# Patient Record
Sex: Female | Born: 1935 | Race: White | Hispanic: No | Smoking: Never smoker
Health system: Southern US, Community
[De-identification: ages and names within clinical notes are randomized; demographics above are authoritative.]

## PROBLEM LIST (undated history)

## (undated) DIAGNOSIS — M1990 Unspecified osteoarthritis, unspecified site: Secondary | ICD-10-CM

## (undated) DIAGNOSIS — E785 Hyperlipidemia, unspecified: Secondary | ICD-10-CM

## (undated) DIAGNOSIS — K5790 Diverticulosis of intestine, part unspecified, without perforation or abscess without bleeding: Secondary | ICD-10-CM

## (undated) DIAGNOSIS — I10 Essential (primary) hypertension: Secondary | ICD-10-CM

## (undated) DIAGNOSIS — I38 Endocarditis, valve unspecified: Secondary | ICD-10-CM

## (undated) DIAGNOSIS — N183 Chronic kidney disease, stage 3 unspecified: Secondary | ICD-10-CM

## (undated) DIAGNOSIS — I739 Peripheral vascular disease, unspecified: Secondary | ICD-10-CM

## (undated) DIAGNOSIS — D126 Benign neoplasm of colon, unspecified: Secondary | ICD-10-CM

## (undated) DIAGNOSIS — I129 Hypertensive chronic kidney disease with stage 1 through stage 4 chronic kidney disease, or unspecified chronic kidney disease: Secondary | ICD-10-CM

## (undated) DIAGNOSIS — I779 Disorder of arteries and arterioles, unspecified: Secondary | ICD-10-CM

## (undated) DIAGNOSIS — G5620 Lesion of ulnar nerve, unspecified upper limb: Secondary | ICD-10-CM

## (undated) HISTORY — PX: CATARACT EXTRACTION: SUR2

## (undated) HISTORY — PX: ULNAR NERVE TRANSPOSITION: SHX2595

## (undated) HISTORY — PX: TONSILLECTOMY: SUR1361

---

## 1988-07-19 HISTORY — PX: OTHER SURGICAL HISTORY: SHX169

## 2004-08-31 ENCOUNTER — Ambulatory Visit: Payer: Self-pay | Admitting: Family Medicine

## 2005-09-02 ENCOUNTER — Ambulatory Visit: Payer: Self-pay | Admitting: Family Medicine

## 2006-09-06 ENCOUNTER — Ambulatory Visit: Payer: Self-pay | Admitting: Family Medicine

## 2007-05-02 ENCOUNTER — Ambulatory Visit: Payer: Self-pay | Admitting: General Surgery

## 2007-05-17 ENCOUNTER — Other Ambulatory Visit: Payer: Self-pay

## 2007-05-17 ENCOUNTER — Ambulatory Visit: Payer: Self-pay | Admitting: General Surgery

## 2007-05-24 ENCOUNTER — Inpatient Hospital Stay: Payer: Self-pay | Admitting: General Surgery

## 2007-09-13 ENCOUNTER — Emergency Department: Payer: Self-pay | Admitting: Emergency Medicine

## 2007-09-28 ENCOUNTER — Emergency Department: Payer: Self-pay | Admitting: Emergency Medicine

## 2007-11-08 ENCOUNTER — Ambulatory Visit: Payer: Self-pay | Admitting: Family Medicine

## 2008-03-18 ENCOUNTER — Ambulatory Visit: Payer: Self-pay | Admitting: Orthopedic Surgery

## 2008-03-21 ENCOUNTER — Other Ambulatory Visit: Payer: Self-pay

## 2008-03-21 ENCOUNTER — Ambulatory Visit: Payer: Self-pay | Admitting: Orthopedic Surgery

## 2008-11-11 ENCOUNTER — Ambulatory Visit: Payer: Self-pay | Admitting: Family Medicine

## 2008-12-12 ENCOUNTER — Ambulatory Visit: Payer: Self-pay | Admitting: Gastroenterology

## 2009-12-19 ENCOUNTER — Ambulatory Visit: Payer: Self-pay | Admitting: Internal Medicine

## 2010-12-24 ENCOUNTER — Ambulatory Visit: Payer: Self-pay | Admitting: Internal Medicine

## 2011-12-29 ENCOUNTER — Ambulatory Visit: Payer: Self-pay | Admitting: Internal Medicine

## 2012-07-19 HISTORY — PX: EYE SURGERY: SHX253

## 2012-07-19 HISTORY — PX: SHOULDER ARTHROSCOPY: SHX128

## 2012-12-29 ENCOUNTER — Ambulatory Visit: Payer: Self-pay | Admitting: Internal Medicine

## 2013-12-31 ENCOUNTER — Ambulatory Visit: Payer: Self-pay | Admitting: Internal Medicine

## 2016-06-17 ENCOUNTER — Encounter: Payer: Self-pay | Admitting: *Deleted

## 2016-06-17 ENCOUNTER — Encounter
Admission: RE | Admit: 2016-06-17 | Discharge: 2016-06-17 | Disposition: A | Discharge status: Home / Self Care | Payer: Medicare Other | Source: Ambulatory Visit | Attending: Orthopedic Surgery | Admitting: Orthopedic Surgery

## 2016-06-17 DIAGNOSIS — Z01818 Encounter for other preprocedural examination: Secondary | ICD-10-CM | POA: Diagnosis not present

## 2016-06-17 DIAGNOSIS — M1812 Unilateral primary osteoarthritis of first carpometacarpal joint, left hand: Secondary | ICD-10-CM | POA: Insufficient documentation

## 2016-06-17 LAB — BASIC METABOLIC PANEL
ANION GAP: 6 (ref 5–15)
BUN: 23 mg/dL — ABNORMAL HIGH (ref 6–20)
CO2: 29 mmol/L (ref 22–32)
Calcium: 9.5 mg/dL (ref 8.9–10.3)
Chloride: 104 mmol/L (ref 101–111)
Creatinine, Ser: 1.49 mg/dL — ABNORMAL HIGH (ref 0.44–1.00)
GFR, EST AFRICAN AMERICAN: 37 mL/min — AB (ref >60)
GFR, EST NON AFRICAN AMERICAN: 32 mL/min — AB (ref >60)
Glucose, Bld: 103 mg/dL — ABNORMAL HIGH (ref 65–99)
POTASSIUM: 4.2 mmol/L (ref 3.5–5.1)
SODIUM: 139 mmol/L (ref 135–145)

## 2016-06-17 NOTE — OR Nursing (Signed)
Patty from Dr. Sharyon Cable' office called back to say that an EKG was not done last week, but an ECHO was performed. Asked her to fax that to [facility]  Will call patient to return for an EKG.

## 2016-06-17 NOTE — OR Nursing (Signed)
Patient states she had an ekg at dr Sharyon Cable' office last week.  Will contact them to obtain a copy of this ekg.

## 2016-06-17 NOTE — Patient Instructions (Signed)
  Your procedure is scheduled on: Thursday, June 24, 2016  Report to [facility], Second Floor.  To find out your arrival time please call [phone] between 1PM - 3PM on Wednesday, June 23, 2016  Remember: Instructions that are not followed completely may result in serious medical risk, up to and including death, or upon the discretion of your surgeon and anesthesiologist your surgery may need to be rescheduled.    _X___ 1. Do not eat food or drink liquids after midnight. No gum chewing or hard candies.     __X__ 2. No Alcohol for 24 hours before or after surgery.   ____ 3. Do Not Smoke For 24 Hours Prior to Your Surgery.   ____ 4. Bring all medications with you on the day of surgery if instructed.    ____ 5. Notify your doctor if there is any change in your medical condition     (cold, fever, infections).       Do not wear jewelry, make-up, hairpins, clips or nail polish.  Do not wear lotions, powders, or perfumes. You may wear deodorant.  Do not shave 48 hours prior to surgery. Men may shave face and neck.  Do not bring valuables to the hospital.    [facility] is not responsible for any belongings or valuables.               Contacts, dentures or bridgework may not be worn into surgery.   Leave your suitcase in the car. After surgery it may be brought to your room.   For patients admitted to the hospital, discharge time is determined by your treatment team.   Patients discharged the day of surgery will not be allowed to drive home.   Please read over the following fact sheets that you were given:   Soap Instruction Sheet ____ Take these medicines the morning of surgery with A SIP OF WATER:    1. Amlodipine  2. Lisinopril  3.   4.  5.STOP SULFASALAZINE AS OF TODAY  6.  ____ Fleet Enema (as directed)   _X___ Use CHG Soap as directed  ____ Use inhalers on the day of surgery  ____ Stop metformin 2 days prior to surgery    ____ Take 1/2 of usual insulin  dose the night before surgery and none on the morning of surgery.   __X_ Stop Aspirin as of TODAY  __X__ Stop Anti-inflammatories AS OF TODAY   __X__ Stop supplements until after surgery.  SUCH AS CALCIUM  ____ Bring C-Pap to the hospital.

## 2016-06-21 ENCOUNTER — Encounter
Admission: RE | Admit: 2016-06-21 | Discharge: 2016-06-21 | Disposition: A | Discharge status: Home / Self Care | Payer: Medicare Other | Source: Ambulatory Visit | Attending: Orthopedic Surgery | Admitting: Orthopedic Surgery

## 2016-06-21 DIAGNOSIS — M810 Age-related osteoporosis without current pathological fracture: Secondary | ICD-10-CM | POA: Diagnosis not present

## 2016-06-21 DIAGNOSIS — Z7982 Long term (current) use of aspirin: Secondary | ICD-10-CM | POA: Diagnosis not present

## 2016-06-21 DIAGNOSIS — M1812 Unilateral primary osteoarthritis of first carpometacarpal joint, left hand: Secondary | ICD-10-CM | POA: Diagnosis not present

## 2016-06-21 DIAGNOSIS — M064 Inflammatory polyarthropathy: Secondary | ICD-10-CM | POA: Diagnosis not present

## 2016-06-21 DIAGNOSIS — Z86010 Personal history of colonic polyps: Secondary | ICD-10-CM | POA: Diagnosis not present

## 2016-06-21 DIAGNOSIS — I10 Essential (primary) hypertension: Secondary | ICD-10-CM | POA: Diagnosis not present

## 2016-06-24 ENCOUNTER — Encounter
Admission: RE | Disposition: A | Discharge status: Home / Self Care | Payer: Self-pay | Source: Ambulatory Visit | Attending: Orthopedic Surgery

## 2016-06-24 ENCOUNTER — Ambulatory Visit: Payer: Medicare Other | Admitting: Anesthesiology

## 2016-06-24 ENCOUNTER — Ambulatory Visit
Admission: RE | Admit: 2016-06-24 | Discharge: 2016-06-24 | Disposition: A | Discharge status: Home / Self Care | Payer: Medicare Other | Source: Ambulatory Visit | Attending: Orthopedic Surgery | Admitting: Orthopedic Surgery

## 2016-06-24 DIAGNOSIS — M810 Age-related osteoporosis without current pathological fracture: Secondary | ICD-10-CM | POA: Insufficient documentation

## 2016-06-24 DIAGNOSIS — Z86010 Personal history of colonic polyps: Secondary | ICD-10-CM | POA: Insufficient documentation

## 2016-06-24 DIAGNOSIS — M1812 Unilateral primary osteoarthritis of first carpometacarpal joint, left hand: Secondary | ICD-10-CM | POA: Insufficient documentation

## 2016-06-24 DIAGNOSIS — I10 Essential (primary) hypertension: Secondary | ICD-10-CM | POA: Insufficient documentation

## 2016-06-24 DIAGNOSIS — M064 Inflammatory polyarthropathy: Secondary | ICD-10-CM | POA: Insufficient documentation

## 2016-06-24 DIAGNOSIS — Z7982 Long term (current) use of aspirin: Secondary | ICD-10-CM | POA: Insufficient documentation

## 2016-06-24 HISTORY — PX: CARPOMETACARPAL (CMC) FUSION OF THUMB: SHX6290

## 2016-06-24 HISTORY — DX: Essential (primary) hypertension: I10

## 2016-06-24 SURGERY — CARPOMETACARPAL (CMC) FUSION OF THUMB
Anesthesia: General | Laterality: Left | Wound class: Clean

## 2016-06-24 MED ORDER — METOCLOPRAMIDE HCL 5 MG/ML IJ SOLN: 5.0000 mg | Freq: Three times a day (TID) | INTRAMUSCULAR | Status: DC | PRN

## 2016-06-24 MED ORDER — ONDANSETRON HCL 4 MG/2ML IJ SOLN: 4.0000 mg | Freq: Four times a day (QID) | INTRAMUSCULAR | Status: DC | PRN

## 2016-06-24 MED ORDER — SODIUM CHLORIDE 0.9 % IV SOLN: INTRAVENOUS | Status: DC

## 2016-06-24 MED ORDER — LACTATED RINGERS IV SOLN
INTRAVENOUS | Status: DC
  Administered 2016-06-24: 10:00:00 via INTRAVENOUS

## 2016-06-24 MED ORDER — NEOMYCIN-POLYMYXIN B GU 40-200000 IR SOLN
Status: AC
  Filled 2016-06-24: qty 2

## 2016-06-24 MED ORDER — MIDAZOLAM HCL 2 MG/2ML IJ SOLN
INTRAMUSCULAR | Status: DC | PRN
Start: 2016-06-24 — End: 2016-06-24
  Administered 2016-06-24: 1 mg via INTRAVENOUS

## 2016-06-24 MED ORDER — CEFAZOLIN SODIUM-DEXTROSE 2-4 GM/100ML-% IV SOLN: 2.0000 g | Freq: Once | INTRAVENOUS | Status: DC

## 2016-06-24 MED ORDER — LABETALOL HCL 5 MG/ML IV SOLN
INTRAVENOUS | Status: DC | PRN
  Administered 2016-06-24: 2.5 mg via INTRAVENOUS

## 2016-06-24 MED ORDER — ONDANSETRON HCL 4 MG/2ML IJ SOLN
INTRAMUSCULAR | Status: DC | PRN
  Administered 2016-06-24: 4 mg via INTRAVENOUS

## 2016-06-24 MED ORDER — BUPIVACAINE HCL (PF) 0.5 % IJ SOLN
INTRAMUSCULAR | Status: AC
  Filled 2016-06-24: qty 30

## 2016-06-24 MED ORDER — HYDROCODONE-ACETAMINOPHEN 5-325 MG PO TABS: 1.0000 | ORAL_TABLET | Freq: Four times a day (QID) | ORAL | 0 refills | Status: DC | PRN

## 2016-06-24 MED ORDER — ONDANSETRON HCL 4 MG/2ML IJ SOLN: 4.0000 mg | Freq: Once | INTRAMUSCULAR | Status: DC | PRN

## 2016-06-24 MED ORDER — DEXAMETHASONE SODIUM PHOSPHATE 10 MG/ML IJ SOLN
INTRAMUSCULAR | Status: DC | PRN
  Administered 2016-06-24: 4 mg via INTRAVENOUS

## 2016-06-24 MED ORDER — FENTANYL CITRATE (PF) 100 MCG/2ML IJ SOLN
INTRAMUSCULAR | Status: DC | PRN
  Administered 2016-06-24 (×5): 25 ug via INTRAVENOUS

## 2016-06-24 MED ORDER — ONDANSETRON HCL 4 MG PO TABS: 4.0000 mg | ORAL_TABLET | Freq: Four times a day (QID) | ORAL | Status: DC | PRN

## 2016-06-24 MED ORDER — BUPIVACAINE HCL (PF) 0.5 % IJ SOLN
INTRAMUSCULAR | Status: DC | PRN
  Administered 2016-06-24: 10 mL

## 2016-06-24 MED ORDER — NEOMYCIN-POLYMYXIN B GU 40-200000 IR SOLN
Status: DC | PRN
  Administered 2016-06-24: 2 mL

## 2016-06-24 MED ORDER — FENTANYL CITRATE (PF) 100 MCG/2ML IJ SOLN
INTRAMUSCULAR | Status: AC
  Administered 2016-06-24: 25 ug via INTRAVENOUS
  Filled 2016-06-24: qty 2

## 2016-06-24 MED ORDER — PROPOFOL 10 MG/ML IV BOLUS
INTRAVENOUS | Status: DC | PRN
  Administered 2016-06-24: 100 mg via INTRAVENOUS

## 2016-06-24 MED ORDER — FENTANYL CITRATE (PF) 100 MCG/2ML IJ SOLN
25.0000 ug | INTRAMUSCULAR | Status: DC | PRN
  Administered 2016-06-24 (×4): 25 ug via INTRAVENOUS

## 2016-06-24 MED ORDER — CEFAZOLIN SODIUM-DEXTROSE 2-4 GM/100ML-% IV SOLN
INTRAVENOUS | Status: AC
  Filled 2016-06-24: qty 100

## 2016-06-24 MED ORDER — HYDROCODONE-ACETAMINOPHEN 5-325 MG PO TABS: 1.0000 | ORAL_TABLET | ORAL | Status: DC | PRN

## 2016-06-24 MED ORDER — METOCLOPRAMIDE HCL 10 MG PO TABS: 5.0000 mg | ORAL_TABLET | Freq: Three times a day (TID) | ORAL | Status: DC | PRN

## 2016-06-24 MED ORDER — GELATIN ABSORBABLE 12-7 MM EX MISC
CUTANEOUS | Status: AC
  Filled 2016-06-24: qty 1

## 2016-06-24 MED ORDER — GELATIN ABSORBABLE 12-7 MM EX MISC
CUTANEOUS | Status: DC | PRN
  Administered 2016-06-24: 1

## 2016-06-24 SURGICAL SUPPLY — 37 items
BANDAGE ELASTIC 3 LF NS (GAUZE/BANDAGES/DRESSINGS) ×3 IMPLANT
BANDAGE STRETCH 3X4.1 STRL (GAUZE/BANDAGES/DRESSINGS) ×3 IMPLANT
BLADE OSC/SAGITTAL 5.5X25 (BLADE) ×3 IMPLANT
BNDG ESMARK 4X12 TAN STRL LF (GAUZE/BANDAGES/DRESSINGS) ×3 IMPLANT
CAST PADDING 3X4FT ST 30246 (SOFTGOODS) ×2
CORD MONOPOLAR M/FML 12FT (MISCELLANEOUS) IMPLANT
CUFF TOURN 18 STER (MISCELLANEOUS) ×3 IMPLANT
DRAPE FLUOR MINI C-ARM 54X84 (DRAPES) ×3 IMPLANT
ELECT CAUTERY BLADE 6.4 (BLADE) ×3 IMPLANT
FORCEPS JEWEL BIP 4-3/4 STR (INSTRUMENTS) ×3 IMPLANT
GAUZE PETRO XEROFOAM 1X8 (MISCELLANEOUS) ×3 IMPLANT
GAUZE SPONGE 4X4 12PLY STRL (GAUZE/BANDAGES/DRESSINGS) ×3 IMPLANT
GLOVE BIOGEL PI IND STRL 9 (GLOVE) ×1 IMPLANT
GLOVE BIOGEL PI INDICATOR 9 (GLOVE) ×2
GLOVE SURG SYN 9.0  PF PI (GLOVE) ×2
GLOVE SURG SYN 9.0 PF PI (GLOVE) ×1 IMPLANT
GOWN SRG 2XL LVL 4 RGLN SLV (GOWNS) ×1 IMPLANT
GOWN STRL NON-REIN 2XL LVL4 (GOWNS) ×2
GOWN STRL REUS W/ TWL LRG LVL3 (GOWN DISPOSABLE) ×1 IMPLANT
GOWN STRL REUS W/TWL LRG LVL3 (GOWN DISPOSABLE) ×2
KIT RM TURNOVER STRD PROC AR (KITS) ×3 IMPLANT
NDL KEITH SZ2.5 (NEEDLE) IMPLANT
NDL SAFETY 25GX1.5 (NEEDLE) ×3 IMPLANT
NS IRRIG 500ML POUR BTL (IV SOLUTION) ×3 IMPLANT
PACK EXTREMITY ARMC (MISCELLANEOUS) ×3 IMPLANT
PAD CAST CTTN 3X4 STRL (SOFTGOODS) ×1 IMPLANT
SPLINT CAST 1 STEP 3X12 (MISCELLANEOUS) ×3 IMPLANT
SPLINT WRIST M LT TX990308 (SOFTGOODS) ×3 IMPLANT
STOCKINETTE STRL 4IN 9604848 (GAUZE/BANDAGES/DRESSINGS) ×3 IMPLANT
SUT ETHILON 4-0 (SUTURE) ×2
SUT ETHILON 4-0 FS2 18XMFL BLK (SUTURE) ×1
SUT VIC AB 0 CT2 27 (SUTURE) ×3 IMPLANT
SUTURE ETHLN 4-0 FS2 18XMF BLK (SUTURE) ×1 IMPLANT
SYRINGE 10CC LL (SYRINGE) ×3 IMPLANT
SYSTEM IMPLANT TIGHTROPE MINI (Anchor) ×3 IMPLANT
WIRE Z .035 C-WIRE SPADE TIP (WIRE) IMPLANT
WIRE Z .062 C-WIRE SPADE TIP (WIRE) IMPLANT

## 2016-06-24 NOTE — Anesthesia Procedure Notes (Signed)
Procedure Name: LMA Insertion Date/Time: 06/24/2016 11:34 AM Performed by: Dionne Bucy Pre-anesthesia Checklist: Patient identified, Patient being monitored, Timeout performed, Emergency Drugs available and Suction available Patient Re-evaluated:Patient Re-evaluated prior to inductionOxygen Delivery Method: Circle system utilized Preoxygenation: Pre-oxygenation with 100% oxygen Intubation Type: IV induction Ventilation: Mask ventilation without difficulty LMA: LMA inserted LMA Size: 3.5 Tube type: Oral Number of attempts: 1 Placement Confirmation: positive ETCO2 and breath sounds checked- equal and bilateral Tube secured with: Tape Dental Injury: Teeth and Oropharynx as per pre-operative assessment

## 2016-06-24 NOTE — H&P (Signed)
Reviewed paper H+P, will be scanned into chart. No changes noted.  

## 2016-06-24 NOTE — Anesthesia Preprocedure Evaluation (Signed)
Anesthesia Evaluation  Patient identified by MRN, date of birth, ID band Patient awake    Reviewed: Allergy & Precautions, H&P , NPO status , Patient's Chart, lab work & pertinent test results, reviewed documented beta blocker date and time   History of Anesthesia Complications Negative for: history of anesthetic complications  Airway Mallampati: I  TM Distance: >3 FB Neck ROM: full    Dental  (+) Caps, Teeth Intact   Pulmonary neg pulmonary ROS,    Pulmonary exam normal breath sounds clear to auscultation       Cardiovascular Exercise Tolerance: Good hypertension, (-) angina(-) CAD, (-) Past MI, (-) Cardiac Stents and (-) CABG Normal cardiovascular exam(-) dysrhythmias (-) Valvular Problems/Murmurs Rhythm:regular Rate:Normal     Neuro/Psych negative neurological ROS  negative psych ROS   GI/Hepatic negative GI ROS, Neg liver ROS,   Endo/Other  negative endocrine ROS  Renal/GU negative Renal ROS  negative genitourinary   Musculoskeletal   Abdominal   Peds  Hematology negative hematology ROS (+)   Anesthesia Other Findings Past Medical History: No date: Hypertension   Reproductive/Obstetrics negative OB ROS                             Anesthesia Physical Anesthesia Plan  ASA: II  Anesthesia Plan: General   Post-op Pain Management:    Induction:   Airway Management Planned:   Additional Equipment:   Intra-op Plan:   Post-operative Plan:   Informed Consent: I have reviewed the patients History and Physical, chart, labs and discussed the procedure including the risks, benefits and alternatives for the proposed anesthesia with the patient or authorized representative who has indicated his/her understanding and acceptance.   Dental Advisory Given  Plan Discussed with: Anesthesiologist, CRNA and Surgeon  Anesthesia Plan Comments:         Anesthesia Quick Evaluation

## 2016-06-24 NOTE — Progress Notes (Signed)
Can wiggle fingers on left   Circulation positive to left hand   Ice to incision

## 2016-06-24 NOTE — Transfer of Care (Signed)
Immediate Anesthesia Transfer of Care Note  Patient: Hannah Hooper  Procedure(s) Performed: Procedure(s): CARPOMETACARPAL [facility]) ARthroplasty OF THUMB (Left)  Patient Location: PACU  Anesthesia Type:General  Level of Consciousness: patient cooperative and lethargic  Airway & Oxygen Therapy: Patient Spontanous Breathing and Patient connected to face mask oxygen  Post-op Assessment: Report given to RN and Post -op Vital signs reviewed and stable  Post vital signs: Reviewed and stable  Last Vitals:  Vitals:   06/24/16 0922 06/24/16 1301  BP: (!) 187/86 (!) 145/65  Pulse: 75 81  Resp: 16 12  Temp: 36.9 C     Last Pain:  Vitals:   06/24/16 0922  TempSrc: Tympanic         Complications: No apparent anesthesia complications

## 2016-06-24 NOTE — Op Note (Signed)
06/24/2016  1:06 PM  PATIENT:  Hannah Hooper  82 y.o. female  PRE-OPERATIVE DIAGNOSIS:  PRIMARY OSTEOARTHRITIS OF FIRST CARPOMETACARPAL  POST-OPERATIVE DIAGNOSIS:  PRIMARY OSTEOARTHRITIS OF FIRST CARPOMETACARPAL  PROCEDURE:  Procedure(s): CARPOMETACARPAL ([facility]) ARthroplasty OF THUMB (Left)  SURGEON: Laurene Footman, MD  ASSISTANTS: None  ANESTHESIA:   general  EBL:  Total I/O In: 650 [I.V.:650] Out: 25 [Blood:25]  BLOOD ADMINISTERED:none  DRAINS: none   LOCAL MEDICATIONS USED:  MARCAINE     SPECIMEN:  No Specimen  DISPOSITION OF SPECIMEN:  N/A  COUNTS:  YES  TOURNIQUET:   56 minutes at 250 mmHg  IMPLANTS: Mini tight rope for [facility] arthroplasty  DICTATION: .Dragon Dictation patient brought the operating room and after adequate anesthesia was obtained left arm was prepped and draped in sterile fashion. After patient ID education and timeout procedures were completed tourniquet was raised. First the [facility] joint was exposed with incision from the base of the first metacarpal to the distal distal radial styloid with cutaneous nerve preserved the capsule was then opened and the [facility] joint explored and noted to have sclerotic bone on both sides of the joint. With the base of the first metacarpal exposed and a mini tight rope was passed from the base of the first metacarpal into the midportion of the second metacarpal is assistants to suspend the joint this was passed through over a wire and then tightened at the second metacarpal and with a not to hold in position and an and anatomic position with slight distraction next the trapezium was removed for using a saw and removing fragments including a large spur going up between the base of the first and second metacarpals after that was adequately debrided the tight rope was again checked and sutures cut the thumb CMC joint was stable the defect where the trapezium was removed was packed with Gelfoam and the capsule closed with #1 Vicryl for 0  nylon was used to close the skin with 10 cc of half percent Sensorcaine without epinephrine infiltrated for postop analgesia. A thumb spica cast was then applied with the thumb in a neutral position and the MCP and IP joint extended. Tourniquet was let down prior to application of dressings and there is minimal bleeding  PLAN OF CARE: Discharge to home after PACU  PATIENT DISPOSITION:  PACU - hemodynamically stable.

## 2016-06-24 NOTE — Anesthesia Procedure Notes (Signed)
Performed by: Hilmar Moldovan       

## 2016-06-24 NOTE — OR Nursing (Signed)
Patient has a cat scratch on left wrist will notify Dr Rudene Christians

## 2016-06-24 NOTE — Discharge Instructions (Addendum)
AMBULATORY SURGERY  DISCHARGE INSTRUCTIONS   1) The drugs that you were given will stay in your system until tomorrow so for the next 24 hours you should not:  A) Drive an automobile B) Make any legal decisions C) Drink any alcoholic beverage   2) You may resume regular meals tomorrow.  Today it is better to start with liquids and gradually work up to solid foods.  You may eat anything you prefer, but it is better to start with liquids, then soup and crackers, and gradually work up to solid foods.   3) Please notify your doctor immediately if you have any unusual bleeding, trouble breathing, redness and pain at the surgery site, drainage, fever, or pain not relieved by medication. 4)   5) Your post-operative visit with Dr.                                     is: Date:                        Time:    Please call to schedule your post-operative visit.  6) Additional Instructions:        Keep dressing clean and dry. Okay to work fingers but don't bother trying to bend thumb. Pain medicine as directed. Okay to put ice on wrist tonight and tomorrow

## 2016-06-24 NOTE — OR Nursing (Signed)
Dr Rudene Christians assessed scratch on left wrist

## 2016-06-25 NOTE — Anesthesia Postprocedure Evaluation (Signed)
Anesthesia Post Note  Patient: Hannah Hooper  Procedure(s) Performed: Procedure(s) (LRB): CARPOMETACARPAL ([facility]) ARthroplasty OF THUMB (Left)  Patient location during evaluation: PACU Anesthesia Type: General Level of consciousness: awake and alert Pain management: pain level controlled Vital Signs Assessment: post-procedure vital signs reviewed and stable Respiratory status: spontaneous breathing, nonlabored ventilation, respiratory function stable and patient connected to nasal cannula oxygen Cardiovascular status: blood pressure returned to baseline and stable Postop Assessment: no signs of nausea or vomiting Anesthetic complications: no    Last Vitals:  Vitals:   06/24/16 1412 06/24/16 1441  BP: (!) 181/80 (!) 183/81  Pulse: 79 81  Resp: 16   Temp: 36.4 C     Last Pain:  Vitals:   06/24/16 1412  TempSrc: Temporal  PainSc:                  [name]

## 2016-12-28 ENCOUNTER — Other Ambulatory Visit: Payer: Self-pay | Admitting: Internal Medicine

## 2016-12-28 DIAGNOSIS — Z1231 Encounter for screening mammogram for malignant neoplasm of breast: Secondary | ICD-10-CM

## 2017-01-21 ENCOUNTER — Ambulatory Visit
Admission: RE | Admit: 2017-01-21 | Discharge: 2017-01-21 | Disposition: A | Discharge status: Home / Self Care | Payer: Medicare Other | Source: Ambulatory Visit | Attending: Internal Medicine | Admitting: Internal Medicine

## 2017-01-21 DIAGNOSIS — Z1231 Encounter for screening mammogram for malignant neoplasm of breast: Secondary | ICD-10-CM

## 2017-11-24 ENCOUNTER — Other Ambulatory Visit: Payer: Self-pay | Admitting: Internal Medicine

## 2017-11-24 DIAGNOSIS — Z1231 Encounter for screening mammogram for malignant neoplasm of breast: Secondary | ICD-10-CM

## 2017-12-21 ENCOUNTER — Ambulatory Visit (INDEPENDENT_AMBULATORY_CARE_PROVIDER_SITE_OTHER): Payer: Medicare Other | Admitting: Ophthalmology

## 2017-12-21 ENCOUNTER — Encounter (INDEPENDENT_AMBULATORY_CARE_PROVIDER_SITE_OTHER): Payer: Self-pay | Admitting: Ophthalmology

## 2017-12-21 DIAGNOSIS — H35372 Puckering of macula, left eye: Secondary | ICD-10-CM | POA: Diagnosis not present

## 2017-12-21 DIAGNOSIS — H3581 Retinal edema: Secondary | ICD-10-CM

## 2017-12-21 DIAGNOSIS — H35342 Macular cyst, hole, or pseudohole, left eye: Secondary | ICD-10-CM

## 2017-12-21 DIAGNOSIS — H43811 Vitreous degeneration, right eye: Secondary | ICD-10-CM | POA: Diagnosis not present

## 2017-12-21 DIAGNOSIS — H43822 Vitreomacular adhesion, left eye: Secondary | ICD-10-CM | POA: Diagnosis not present

## 2017-12-21 DIAGNOSIS — Z961 Presence of intraocular lens: Secondary | ICD-10-CM

## 2017-12-21 NOTE — Progress Notes (Signed)
[facility] Note  12/21/2017     CHIEF COMPLAINT Patient presents for Retina Evaluation   HISTORY OF PRESENT ILLNESS: Hannah Hooper is a 84 y.o. female who presents to the clinic today for:   HPI    Retina Evaluation    In both eyes.  This started 1 week ago.  Associated Symptoms Negative for Flashes, Pain, Trauma, Fever, Weight Loss, Scalp Tenderness, Redness, Floaters, Distortion, Photophobia, Jaw Claudication, Fatigue, Shoulder/Hip pain, Glare and Blind Spot.  Context:  distance vision, mid-range vision, near vision and reading.  Treatments tried include surgery.  Response to treatment was significant improvement.  I, the attending physician,  performed the HPI with the patient and updated documentation appropriately.          Comments    Referral of Dr. Marvel Plan for retina evaluation. Patient states she noticed recently cloudiness OS. Pt states she was able to read fine print last year, but now it more of a struggle.Pt denies flashes, floaters and light sensitivity. Pt reports she had cataract sx OU 5-7 years ago with significant improvement. Pt uses Dry eye Gtt's PRN, denies Vit's         Last edited by Bernarda Caffey, MD on 12/21/2017  2:53 PM. (History)    Pt states she was seen by Dr. Marvel Plan yesterday for annual check up; Pt states she has noticed "a little bit of cloudiness" OS; Pt states she has not had any falls; Pt states last year she "did really good";   Referring physician: [facility] [address], [place] [address]  HISTORICAL INFORMATION:   Selected notes from the MEDICAL RECORD NUMBER Referred by Dr. Marvel Plan for concern of mac hole OS;  LEE- 06.04.19 ([place]) [[facility] OD: 20/30-1 OS: 20/80-1] Ocular Hx- pseudophakia (2014) PMH- arthritis, HTN, high cholesterol    CURRENT MEDICATIONS: Current Outpatient Medications (Ophthalmic Drugs)  Medication Sig  . Tetrahydrozoline HCl (VISINE OP) Apply 1 drop to eye  daily as needed (burning).   No current facility-administered medications for this visit.  (Ophthalmic Drugs)   Current Outpatient Medications (Other)  Medication Sig  . amLODipine (NORVASC) 5 MG tablet Take 5 mg by mouth daily.  Marland Kitchen aspirin EC 81 MG tablet Take 81 mg by mouth daily.  . Calcium Carb-Cholecalciferol (CALCIUM 1000 + D PO) Take 1 tablet by mouth daily.  Marland Kitchen HYDROcodone-acetaminophen (NORCO) 5-325 MG tablet Take 1 tablet by mouth every 6 (six) hours as needed for moderate pain.  Marland Kitchen lisinopril (PRINIVIL,ZESTRIL) 20 MG tablet Take 20 mg by mouth daily.  . pravastatin (PRAVACHOL) 80 MG tablet Take 80 mg by mouth at bedtime.  . sulfaSALAzine (AZULFIDINE) 500 MG tablet Take 1,000 mg by mouth daily.   No current facility-administered medications for this visit.  (Other)      REVIEW OF SYSTEMS: ROS    Positive for: Eyes   Negative for: Constitutional, Gastrointestinal, Neurological, Skin, Genitourinary, Musculoskeletal, HENT, Endocrine, Cardiovascular, Respiratory, Psychiatric, Allergic/Imm, Heme/Lymph   Last edited by Zenovia Jordan, LPN on 08/25/2534  6:44 PM. (History)       ALLERGIES No Known Allergies  PAST MEDICAL HISTORY Past Medical History:  Diagnosis Date  . Hypertension    Past Surgical History:  Procedure Laterality Date  . bunions  1990  . CARPOMETACARPAL ([facility]) FUSION OF THUMB Left 06/24/2016   Procedure: CARPOMETACARPAL [facility]) ARthroplasty OF THUMB;  Surgeon: Hessie Knows, MD;  Location: [facility] ORS;  Service: Orthopedics;  Laterality: Left;  . CATARACT EXTRACTION    .  EYE SURGERY Bilateral 2014   cataract surgery  . SHOULDER ARTHROSCOPY Right 2014  . TONSILLECTOMY     as a child  . ULNAR NERVE TRANSPOSITION Right     FAMILY HISTORY Family History  Problem Relation Age of Onset  . Breast cancer Mother 29    SOCIAL HISTORY Social History   Tobacco Use  . Smoking status: Never Smoker  . Smokeless tobacco: Never Used  Substance Use Topics  .  Alcohol use: Yes    Comment: occasionally mixed drink  . Drug use: No         OPHTHALMIC EXAM:  Base Eye Exam    Visual Acuity (Snellen - Linear)      Right Left   Dist [place] 20/30 +2 20/60 +2   Dist ph [place] 20/25 NI       Tonometry (Tonopen, 2:33 PM)      Right Left   Pressure 14 16       Pupils      Dark Light Shape React APD   Right 3 2 Round Brisk None   Left 3 2 Round Brisk None       Visual Fields (Counting fingers)      Left Right    Full Full       Extraocular Movement      Right Left    Full, Ortho Full, Ortho       Dilation    Both eyes:  1.0% Mydriacyl, Paremyd @ 2:33 PM        Slit Lamp and Fundus Exam    Slit Lamp Exam      Right Left   Lids/Lashes Dermatochalasis - upper lid Dermatochalasis - upper lid   Conjunctiva/Sclera White and quiet Temporal Pinguecula   Cornea 2+ central Punctate epithelial erosions, Temporal Well healed cataract wounds 3+ diffuse Punctate epithelial erosions, irregular epi inferiorly, Temporal Well healed cataract wounds   Anterior Chamber Deep and quiet Deep and quiet   Iris Round and dilated Round and dilated   Lens PC IOL in good position PC IOL in good position, 1+ Posterior capsular opacification   Vitreous Vitreous syneresis, Posterior vitreous detachment Vitreous syneresis       Fundus Exam      Right Left   Disc Pink and Sharp blurred margins with nasal elevation   C/D Ratio 0.4 0.3   Macula Blunted foveal reflex, mild Retinal pigment epithelial mottling, No heme or edema Blunted foveal reflex, Epiretinal membrane, macular cyst, vitreous traction   Vessels Normal Mild Vascular attenuation   Periphery Attahced, Confluent mid-zonal drusen Attached, Mid-zonal drusen        Refraction    Wearing Rx      Sphere Cylinder Axis   Right +2.25 +0.25 003   Left +2.75 +60.25 002   Type:  SVL          IMAGING AND PROCEDURES  Imaging and Procedures for _0 @  OCT, Retina - OU - Both Eyes       Right  Eye Quality was good. Central Foveal Thickness: 239. Progression has no prior data. Findings include normal foveal contour, no IRF, no SRF (PVD).   Left Eye Quality was good. Central Foveal Thickness: 567. Progression has no prior data. Findings include vitreous traction, intraretinal fluid, abnormal foveal contour, epiretinal membrane.   Notes *Images captured and stored on drive  Diagnosis / Impression:  OD: NFP, No IRF/SRF  OS: VMT, ERM  Clinical management:  See below  Abbreviations: NFP -  Normal foveal profile. CME - cystoid macular edema. PED - pigment epithelial detachment. IRF - intraretinal fluid. SRF - subretinal fluid. EZ - ellipsoid zone. ERM - epiretinal membrane. ORA - outer retinal atrophy. ORT - outer retinal tubulation. 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 Macular retinal cyst of left eye H35.342   3. Epiretinal membrane (ERM) of left eye H35.372   4. Retinal edema H35.81 OCT, Retina - OU - Both Eyes  5. Posterior vitreous detachment of right eye H43.811   6. Pseudophakia of both eyes Z96.1     1-4. 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 - pt maintains fairly good vision -- 20/60+ without metamorphopsia - discussed findings and prognosis and possibility that VMT could spontaneously release or create a macular hole - also discussed treatment options of PPV, intravitreal injection of air or gas, and observation - pt wishes to observe for now - F/U 3-4 weeks  5. PVD / vitreous syneresis OD  Discussed findings and prognosis  No RT or RD on 360 scleral depressed  exam  Reviewed s/s of RT/RD  Strict return precautions for any such RT/RD signs/symptoms  6. Pseudophakia OU  - s/p CE/IOL OU  - doing well  - monitor    Ophthalmic Meds Ordered this visit:  No orders of the defined types were placed in this encounter.      Return in about 1 month (around 01/18/2018) for F/U VMT OS, DFE, OCT.  There are no Patient Instructions on file for this visit.   Explained the diagnoses, plan, and follow up with the patient and they expressed understanding.  Patient expressed understanding of the importance of proper follow up care.   This document serves as a record of services personally performed by Gardiner Sleeper, MD, PhD. It was created on their behalf by Catha Brow, [place], a certified ophthalmic assistant. The creation of this record is the provider's dictation and/or activities during the visit.  Electronically signed by: Catha Brow, [place]  06.05.19 12:50 PM    Gardiner Sleeper, M.D., Ph.D. Diseases & Surgery of the Retina and Vitreous [facility]  I have reviewed the above documentation for accuracy and completeness, and I agree with the above. Gardiner Sleeper, M.D., Ph.D. 12/23/17 12:55 PM     Abbreviations: M myopia (nearsighted); A astigmatism; H hyperopia (farsighted); P presbyopia; Mrx spectacle prescription;  CTL contact lenses; OD right eye; OS left eye; OU both eyes  XT exotropia; ET esotropia; PEK punctate epithelial keratitis; PEE punctate epithelial erosions; DES dry eye syndrome; MGD meibomian gland dysfunction; ATs artificial tears; PFAT's preservative free artificial tears; [place] nuclear sclerotic cataract; PSC posterior subcapsular cataract; ERM epi-retinal membrane; PVD posterior vitreous detachment; RD retinal detachment; DM diabetes mellitus; DR diabetic retinopathy; NPDR non-proliferative diabetic retinopathy; PDR proliferative diabetic retinopathy; CSME clinically significant macular edema; DME  diabetic macular edema; dbh dot blot hemorrhages; CWS cotton wool spot; POAG primary open angle glaucoma; C/D cup-to-disc ratio; HVF humphrey visual field; GVF goldmann visual field; OCT optical coherence tomography; IOP intraocular pressure; BRVO Branch retinal vein occlusion; CRVO central  retinal vein occlusion; CRAO central retinal artery occlusion; BRAO branch retinal artery occlusion; RT retinal tear; SB scleral buckle; PPV pars plana vitrectomy; VH Vitreous hemorrhage; PRP panretinal laser photocoagulation; IVK intravitreal kenalog; VMT vitreomacular traction; MH Macular hole;  NVD neovascularization of the disc; NVE neovascularization elsewhere; AREDS age related eye disease study; ARMD age related macular degeneration; POAG primary open angle glaucoma; EBMD epithelial/anterior basement membrane dystrophy; ACIOL anterior chamber intraocular lens; IOL intraocular lens; PCIOL posterior chamber intraocular lens; Phaco/IOL phacoemulsification with intraocular lens placement; [place] photorefractive keratectomy; LASIK laser assisted in situ keratomileusis; HTN hypertension; DM diabetes mellitus; COPD chronic obstructive pulmonary disease

## 2017-12-23 ENCOUNTER — Encounter (INDEPENDENT_AMBULATORY_CARE_PROVIDER_SITE_OTHER): Payer: Self-pay | Admitting: Ophthalmology

## 2018-01-17 NOTE — Progress Notes (Signed)
[facility] Note  01/18/2018     CHIEF COMPLAINT Patient presents for Retina Follow Up   HISTORY OF PRESENT ILLNESS: Hannah Hooper is a 84 y.o. female who presents to the clinic today for:   HPI    Retina Follow Up    Patient presents with  Other.  In left eye.  Severity is moderate.  Duration of 4 weeks.  Since onset it is stable.  I, the attending physician,  performed the HPI with the patient and updated documentation appropriately.          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 (History)      Referring physician: Kirk Ruths, MD [facility] [place], [place] [address]  HISTORICAL INFORMATION:   Selected notes from the [facility] Referred by Dr. Marvel Plan for concern of mac hole OS;  LEE- 06.04.19 ([facility]) [BCVA OD: 20/30-1 OS: 20/80-1] Ocular Hx- pseudophakia (2014) PMH- arthritis, HTN, high cholesterol    CURRENT MEDICATIONS: Current Outpatient Medications (Ophthalmic Drugs)  Medication Sig  . Tetrahydrozoline HCl (VISINE OP) Apply 1 drop to eye daily as needed (burning).   No current facility-administered medications for this visit.  (Ophthalmic Drugs)   Current Outpatient Medications (Other)  Medication Sig  . amLODipine (NORVASC) 5 MG tablet Take 5 mg by mouth daily.  Marland Kitchen aspirin EC 81 MG tablet Take 81 mg by mouth daily.  . Calcium Carb-Cholecalciferol (CALCIUM 1000 + D PO) Take 1 tablet by mouth daily.  Marland Kitchen HYDROcodone-acetaminophen (NORCO) 5-325 MG tablet Take 1 tablet by  mouth every 6 (six) hours as needed for moderate pain.  Marland Kitchen lisinopril (PRINIVIL,ZESTRIL) 20 MG tablet Take 20 mg by mouth daily.  . pravastatin (PRAVACHOL) 80 MG tablet Take 80 mg by mouth at bedtime.  . sulfaSALAzine (AZULFIDINE) 500 MG tablet Take 1,000 mg by mouth daily.   No current facility-administered medications for this visit.  (Other)      REVIEW OF SYSTEMS: ROS    Positive for: Cardiovascular, Eyes   Negative for: Constitutional, Gastrointestinal, Neurological, Skin, Genitourinary, Musculoskeletal, HENT, Endocrine, Respiratory, Psychiatric, Allergic/Imm, Heme/Lymph   Last edited by Cherrie Gauze, COA on 01/18/2018  1:07 PM. (History)       ALLERGIES No Known Allergies  PAST MEDICAL HISTORY Past Medical History:  Diagnosis Date  . Hypertension    Past Surgical History:  Procedure Laterality Date  . bunions  1990  . CARPOMETACARPAL ([facility]) FUSION OF THUMB Left 06/24/2016   Procedure: CARPOMETACARPAL [facility]) ARthroplasty OF THUMB;  Surgeon: Hessie Knows, MD;  Location: [facility] ORS;  Service: Orthopedics;  Laterality: Left;  . CATARACT EXTRACTION    . EYE SURGERY Bilateral 2014   cataract surgery  . SHOULDER ARTHROSCOPY Right 2014  . TONSILLECTOMY     as a child  . ULNAR NERVE TRANSPOSITION Right     FAMILY HISTORY Family History  Problem Relation Age of Onset  .  Breast cancer Mother 83    SOCIAL HISTORY Social History   Tobacco Use  . Smoking status: Never Smoker  . Smokeless tobacco: Never Used  Substance Use Topics  . Alcohol use: Yes    Comment: occasionally mixed drink  . Drug use: No         OPHTHALMIC EXAM:  Base Eye Exam    Visual Acuity (Snellen - Linear)      Right Left   Dist [place] 20/20 -2 20/70   Dist ph [place] 20/20 20/60       Tonometry (Tonopen, 1:16 PM)      Right Left   Pressure 08 06       Pupils      Dark Light Shape React APD   Right 5 4 Round Brisk None   Left 5 4 Round Brisk None       Visual Fields (Counting fingers)       Left Right    Full Full       Extraocular Movement      Right Left    Full, Ortho Full, Ortho       Neuro/Psych    Oriented x3:  Yes   Mood/Affect:  Normal       Dilation    Both eyes:  1.0% Mydriacyl, 2.5% Phenylephrine @ 1:16 PM        Slit Lamp and Fundus Exam    Slit Lamp Exam      Right Left   Lids/Lashes Dermatochalasis - upper lid Dermatochalasis - upper lid   Conjunctiva/Sclera White and quiet Temporal Pinguecula   Cornea 2+ central Punctate epithelial erosions, Temporal Well healed cataract wounds 3+ diffuse Punctate epithelial erosions, irregular epi inferiorly, Temporal Well healed cataract wounds   Anterior Chamber Deep and quiet Deep and quiet   Iris Round and dilated Round and dilated   Lens PC IOL in good position PC IOL in good position, 1+ Posterior capsular opacification   Vitreous Vitreous syneresis, Posterior vitreous detachment Vitreous syneresis       Fundus Exam      Right Left   Disc Pink and Sharp blurred margins with nasal elevation   C/D Ratio 0.4 0.3   Macula Blunted foveal reflex, mild Retinal pigment epithelial mottling, No heme or edema Blunted foveal reflex, Epiretinal membrane, macular cyst, vitreous traction   Vessels Normal Mild Vascular attenuation   Periphery Attahced, Confluent mid-zonal drusen Attached, confluent mid-zonal drusen          IMAGING AND PROCEDURES  Imaging and Procedures for @TODAY @  OCT, Retina - OU - Both Eyes       Right Eye Quality was good. Central Foveal Thickness: 234. Progression has been stable. Findings include normal foveal contour, no IRF, no SRF (PVD).   Left Eye Quality was good. Central Foveal Thickness: 577. Progression has been stable. Findings include vitreous traction, intraretinal fluid, abnormal foveal contour, epiretinal membrane.   Notes *Images captured and stored on drive  Diagnosis / Impression:  OD: NFP, No IRF/SRF  OS: VMT, ERM  Clinical management:  See  below  Abbreviations: NFP - Normal foveal profile. CME - cystoid macular edema. PED - pigment epithelial detachment. IRF - intraretinal fluid. SRF - subretinal fluid. EZ - ellipsoid zone. ERM - epiretinal membrane. ORA - outer retinal atrophy. ORT - outer retinal tubulation. SRHM - subretinal hyper-reflective material                  ASSESSMENT/PLAN:    ICD-10-CM  1. 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 Macular retinal cyst of left eye H35.342   3. Epiretinal membrane (ERM) of left eye H35.372   4. Retinal edema H35.81 OCT, Retina - OU - Both Eyes  5. Posterior vitreous detachment of right eye H43.811   6. Pseudophakia of both eyes Z96.1     1-4. 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 - pt remains 20/60 without metamorphopsia or significant change/improvement - OCT stable compared to prior - discussed findings and prognosis and possibility that VMT could spontaneously release or create a macular hole - also discussed treatment options of PPV, intravitreal injection of air or gas, and observation - pt now interested in surgery but wishes to wait until August as she is coordinating a large family reunion scheduled for late July - F/U 3-4 weeks for repeat exam, OCT and pre-operative planning  5. PVD / vitreous syneresis OD  Discussed findings and prognosis  No RT or RD on 360 scleral depressed exam  Reviewed s/s of RT/RD  Strict return precautions for any such RT/RD signs/symptoms  6. Pseudophakia OU  - s/p CE/IOL OU  - doing well  - montior    Ophthalmic Meds Ordered this visit:  No orders of the defined types were placed in this encounter.       Return in about 3 weeks (around 02/08/2018) for Dilated Exam, OCT.  There are no Patient Instructions on file for this visit.   Explained the diagnoses, plan, and follow up with the patient and they expressed understanding.  Patient expressed understanding of the importance of proper follow up care.   This document serves as a record of services personally performed by Gardiner Sleeper, MD, PhD. It was created on their behalf by Catha Brow, [place], a certified ophthalmic assistant. The creation of this record is the provider's dictation and/or activities during the visit.  Electronically signed by: Catha Brow, [place]  07.02.19 2:20 PM   Gardiner Sleeper, M.D., Ph.D. Diseases & Surgery of the Retina and Vitreous [facility]   I have reviewed the above documentation for accuracy and completeness, and I agree with the above. Gardiner Sleeper, M.D., Ph.D. 01/22/18 2:23 PM     Abbreviations: M myopia (nearsighted); A astigmatism; H hyperopia (farsighted); P presbyopia; Mrx spectacle prescription;  CTL contact lenses; OD right eye; OS left eye; OU both eyes  XT exotropia; ET esotropia; PEK punctate epithelial keratitis; PEE punctate epithelial erosions; DES dry eye syndrome; MGD meibomian gland dysfunction; ATs artificial tears; PFAT's preservative free artificial tears; [place] nuclear sclerotic cataract; PSC posterior subcapsular cataract; ERM epi-retinal membrane; PVD posterior vitreous detachment; RD retinal detachment; DM diabetes mellitus; DR diabetic retinopathy; NPDR non-proliferative diabetic retinopathy; PDR proliferative diabetic retinopathy; CSME clinically significant macular edema; DME diabetic macular edema; dbh dot blot hemorrhages; CWS cotton wool spot; POAG primary open angle glaucoma; C/D cup-to-disc ratio; HVF humphrey visual field; GVF goldmann visual field; OCT optical coherence tomography; IOP intraocular pressure; BRVO Branch retinal vein occlusion;  CRVO central retinal vein occlusion; CRAO central retinal artery occlusion; BRAO branch retinal artery occlusion; RT retinal tear; SB scleral buckle; PPV pars plana vitrectomy; VH Vitreous hemorrhage; PRP panretinal laser photocoagulation; IVK intravitreal kenalog; VMT vitreomacular  traction; MH Macular hole;  NVD neovascularization of the disc; NVE neovascularization elsewhere; AREDS age related eye disease study; ARMD age related macular degeneration; POAG primary open angle glaucoma; EBMD epithelial/anterior basement membrane dystrophy; ACIOL anterior chamber intraocular lens; IOL intraocular lens; PCIOL posterior chamber intraocular lens; Phaco/IOL phacoemulsification with intraocular lens placement; [place] photorefractive keratectomy; LASIK laser assisted in situ keratomileusis; HTN hypertension; DM diabetes mellitus; COPD chronic obstructive pulmonary disease

## 2018-01-18 ENCOUNTER — Ambulatory Visit (INDEPENDENT_AMBULATORY_CARE_PROVIDER_SITE_OTHER): Payer: Medicare Other | Admitting: Ophthalmology

## 2018-01-18 ENCOUNTER — Encounter (INDEPENDENT_AMBULATORY_CARE_PROVIDER_SITE_OTHER): Payer: Self-pay | Admitting: Ophthalmology

## 2018-01-18 DIAGNOSIS — H35342 Macular cyst, hole, or pseudohole, left eye: Secondary | ICD-10-CM

## 2018-01-18 DIAGNOSIS — Z961 Presence of intraocular lens: Secondary | ICD-10-CM

## 2018-01-18 DIAGNOSIS — H43822 Vitreomacular adhesion, left eye: Secondary | ICD-10-CM

## 2018-01-18 DIAGNOSIS — H3581 Retinal edema: Secondary | ICD-10-CM | POA: Diagnosis not present

## 2018-01-18 DIAGNOSIS — H35372 Puckering of macula, left eye: Secondary | ICD-10-CM

## 2018-01-18 DIAGNOSIS — H43811 Vitreous degeneration, right eye: Secondary | ICD-10-CM

## 2018-01-22 ENCOUNTER — Encounter (INDEPENDENT_AMBULATORY_CARE_PROVIDER_SITE_OTHER): Payer: Self-pay | Admitting: Ophthalmology

## 2018-01-24 ENCOUNTER — Ambulatory Visit
Admission: RE | Admit: 2018-01-24 | Discharge: 2018-01-24 | Disposition: A | Discharge status: Home / Self Care | Payer: Medicare Other | Source: Ambulatory Visit | Attending: Internal Medicine | Admitting: Internal Medicine

## 2018-01-24 DIAGNOSIS — Z1231 Encounter for screening mammogram for malignant neoplasm of breast: Secondary | ICD-10-CM

## 2018-02-07 NOTE — Progress Notes (Signed)
[facility] Note  02/08/2018     CHIEF COMPLAINT Patient presents for Retina Follow Up   HISTORY OF PRESENT ILLNESS: Hannah Hooper is a 84 y.o. female who presents to the clinic today for:   HPI    Retina Follow Up    Patient presents with  Other.  In left eye.  Severity is moderate.  Duration of 3 weeks.  Since onset it is stable.  I, the attending physician,  performed the HPI with the patient and updated documentation appropriately.          Comments    Pt presents for vitreomacular traction OS f/u, pt states she has not noticed any changes in her vision, she states she has been reading a lot, pt denies FOL, pain or wavy vision, she states that occasionally in her left eye she sees a "spot" that will last for about 5 minutes and then go away, pt denies the use of gtts       Last edited by Bernarda Caffey, MD on 02/08/2018  2:11 PM. (History)    Pt states she has not noticed any real change in OS VA; Pt states   Referring physician: Kirk Ruths, MD [facility] [place], [place] [address]  HISTORICAL INFORMATION:   Selected notes from the MEDICAL RECORD NUMBER Referred by Dr. Marvel Plan for concern of mac hole OS;  LEE- 06.04.19 ([facility]) [BCVA OD: 20/30-1 OS: 20/80-1] Ocular Hx- pseudophakia (2014) PMH- arthritis, HTN, high cholesterol    CURRENT MEDICATIONS: Current Outpatient Medications (Ophthalmic Drugs)  Medication Sig  . prednisoLONE acetate (PRED FORTE) 1 % ophthalmic suspension Place 1 drop into the left eye 4 (four) times daily.  . Tetrahydrozoline HCl (VISINE OP) Apply 1 drop to eye daily as needed (burning).   No current facility-administered medications for this visit.  (Ophthalmic Drugs)   Current Outpatient Medications (Other)  Medication Sig  . amLODipine (NORVASC) 5 MG tablet Take 5 mg by mouth daily.  Marland Kitchen aspirin EC 81 MG tablet Take 81 mg by mouth daily.  . Calcium Carb-Cholecalciferol  (CALCIUM 1000 + D PO) Take 1 tablet by mouth daily.  Marland Kitchen HYDROcodone-acetaminophen (NORCO) 5-325 MG tablet Take 1 tablet by mouth every 6 (six) hours as needed for moderate pain.  Marland Kitchen lisinopril (PRINIVIL,ZESTRIL) 20 MG tablet Take 20 mg by mouth daily.  . pravastatin (PRAVACHOL) 80 MG tablet Take 80 mg by mouth at bedtime.  . sulfaSALAzine (AZULFIDINE) 500 MG tablet Take 1,000 mg by mouth daily.   No current facility-administered medications for this visit.  (Other)      REVIEW OF SYSTEMS: ROS    Positive for: Cardiovascular, Eyes   Negative for: Constitutional, Gastrointestinal, Neurological, Skin, Genitourinary, Musculoskeletal, HENT, Endocrine, Respiratory, Psychiatric, Allergic/Imm, Heme/Lymph   Last edited by Debbrah Alar, COT on 02/08/2018  1:36 PM. (History)       ALLERGIES No Known Allergies  PAST MEDICAL HISTORY Past Medical History:  Diagnosis Date  . Hypertension    Past Surgical History:  Procedure Laterality Date  . bunions  1990  . CARPOMETACARPAL ([facility]) FUSION OF THUMB Left 06/24/2016   Procedure: CARPOMETACARPAL [facility]) ARthroplasty OF THUMB;  Surgeon: Hessie Knows, MD;  Location: [facility] ORS;  Service: Orthopedics;  Laterality: Left;  . CATARACT EXTRACTION    . EYE SURGERY Bilateral 2014   cataract surgery  . SHOULDER ARTHROSCOPY Right 2014  . TONSILLECTOMY     as a child  .  ULNAR NERVE TRANSPOSITION Right     FAMILY HISTORY Family History  Problem Relation Age of Onset  . Breast cancer Mother 42    SOCIAL HISTORY Social History   Tobacco Use  . Smoking status: Never Smoker  . Smokeless tobacco: Never Used  Substance Use Topics  . Alcohol use: Yes    Comment: occasionally mixed drink  . Drug use: No         OPHTHALMIC EXAM:  Base Eye Exam    Visual Acuity (Snellen - Linear)      Right Left   Dist [place] 20/25 -1 20/150   Dist ph [place] NI NI   Correction:  Glasses       Tonometry (Tonopen, 1:33 PM)      Right Left   Pressure 12 11        Pupils      Dark Light Shape React APD   Right 2 1.5 Round Minimal None   Left 2 1.5 Round Minimal None       Visual Fields (Counting fingers)      Left Right    Full Full       Extraocular Movement      Right Left    Full, Ortho Full, Ortho       Neuro/Psych    Oriented x3:  Yes   Mood/Affect:  Normal       Dilation    Both eyes:  1.0% Mydriacyl, 2.5% Phenylephrine @ 1:33 PM        Slit Lamp and Fundus Exam    Slit Lamp Exam      Right Left   Lids/Lashes Dermatochalasis - upper lid Dermatochalasis - upper lid   Conjunctiva/Sclera White and quiet Temporal Pinguecula   Cornea 2+ central Punctate epithelial erosions, Temporal Well healed cataract wounds 3+ diffuse Punctate epithelial erosions, irregular epi inferiorly, Temporal Well healed cataract wounds   Anterior Chamber Deep and quiet Deep and quiet   Iris Round and dilated Round and dilated   Lens PC IOL in good position PC IOL in good position, 2+ Posterior capsular opacification   Vitreous Vitreous syneresis, Posterior vitreous detachment Vitreous syneresis       Fundus Exam      Right Left   Disc Pink and Sharp blurred margins with nasal elevation   C/D Ratio 0.4 0.3   Macula Blunted foveal reflex, mild Retinal pigment epithelial mottling, No heme or edema Blunted foveal reflex, Epiretinal membrane with stiae, macular cyst, vitreous traction, blot hemorrhage along ST arcade   Vessels Normal Mild Vascular attenuation   Periphery Attahced, Confluent mid-zonal drusen Attached, confluent mid-zonal drusen          IMAGING AND PROCEDURES  Imaging and Procedures for _0 @  OCT, Retina - OU - Both Eyes       Right Eye Quality was good. Central Foveal Thickness: 237. Progression has been stable. Findings include normal foveal contour, no IRF, no SRF (PVD).   Left Eye Quality was good. Central Foveal Thickness: 620. Progression has been stable. Findings include vitreous traction, intraretinal fluid, abnormal  foveal contour, epiretinal membrane.   Notes *Images captured and stored on drive  Diagnosis / Impression:  OD: NFP, No IRF/SRF  OS: VMT, ERM  Clinical management:  See below  Abbreviations: NFP - Normal foveal profile. CME - cystoid macular edema. PED - pigment epithelial detachment. IRF - intraretinal fluid. SRF - subretinal fluid. EZ - ellipsoid zone. ERM - epiretinal membrane. ORA - outer retinal atrophy.  ORT - outer retinal tubulation. SRHM - subretinal hyper-reflective material         Yag Capsulotomy - OS - Left Eye       Procedure note: YAG Capsulotomy, LEFT Eye  Informed consent obtained. Pre-op dilating drops (1% Topicamide and 2.5% Phenylephrine), and topical anesthesia given. Power: 7.8 mJ Shots: 19 Posterior capsulotomy in cruciate formation performed without difficulty. Patient tolerated procedure well. No complications. Rx pred forte 4 times a day for 7 days, then stop. Pt received written and verbal post laser education.                  ASSESSMENT/PLAN:    ICD-10-CM   1. 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 Macular retinal cyst of left eye H35.342   3. Epiretinal membrane (ERM) of left eye H35.372   4. Retinal edema H35.81 OCT, Retina - OU - Both Eyes  5. Posterior vitreous detachment of right eye H43.811   6. Pseudophakia of both eyes Z96.1   7. PCO (posterior capsular opacification), left H26.492 Yag Capsulotomy - OS - Left Eye    1-4. 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 - pt with decreased BCVA to 20/150 from 20/60 - OCT with persistent traction / VMT -- no release or improvement in VMT or cystoid intraretinal changes - discussed  findings and prognosis - also discussed treatment options of PPV, intravitreal injection of air or gas, and observation - RBA of procedure discussed, questions answered - informed consent obtained and signed - scheduled for 25g PPV with MP OS (08.15.19) - [facility] 8 - will need surgical clearance from PCP - F/U POD1  5. PVD / vitreous syneresis OD  Discussed findings and prognosis  No RT or RD on 360 scleral depressed exam  Reviewed s/s of RT/RD  Strict return precautions for any such RT/RD signs/symptoms  6,7. Pseudophakia OU w/ visually significant PCO OS  - s/p CE/IOL OU  - recommend YAG capsulotomy OS today -- pt wishes to proceed  - RBA of procedure discussed, questions answered  - informed consent obtained and signed  - see procedure note  - start PF QID OS x7 d   Ophthalmic Meds Ordered this visit:  Meds ordered this encounter  Medications  . prednisoLONE acetate (PRED FORTE) 1 % ophthalmic suspension    Sig: Place 1 drop into the left eye 4 (four) times daily.    Dispense:  10 mL    Refill:  0       Return in about 3 weeks (around 03/03/2018) for POV.  There are no Patient Instructions on file for this visit.   Explained the diagnoses, plan, and follow up with the patient and they expressed understanding.  Patient expressed understanding of the importance of proper follow up care.   This document serves as a record of services personally performed by Gardiner Sleeper, MD, PhD. It was created on their behalf by Ernest Mallick, OA, an ophthalmic assistant. The creation of this record is the provider's dictation and/or activities during the visit.    Electronically signed by: Ernest Mallick, OA  07.23.2019 1:32 PM    Gardiner Sleeper, M.D., Ph.D. Diseases & Surgery of the Retina and [place] 02/08/18  I have reviewed the above documentation for accuracy and completeness, and I agree with the above. Gardiner Sleeper, M.D., Ph.D.  02/09/18 1:38 PM     Abbreviations: M myopia (nearsighted); A astigmatism; H hyperopia (farsighted); P presbyopia; Mrx spectacle prescription;  CTL contact lenses; OD right eye; OS left eye; OU both eyes  XT exotropia; ET esotropia; PEK punctate epithelial keratitis; PEE punctate epithelial erosions; DES dry eye syndrome; MGD meibomian gland dysfunction; ATs artificial tears; PFAT's preservative free artificial tears; [place] nuclear sclerotic cataract; PSC posterior subcapsular cataract; ERM epi-retinal membrane; PVD posterior vitreous detachment; RD retinal detachment; DM diabetes mellitus; DR diabetic retinopathy; NPDR non-proliferative diabetic retinopathy; PDR proliferative diabetic retinopathy; CSME clinically significant macular edema; DME diabetic macular edema; dbh dot blot hemorrhages; CWS cotton wool spot; POAG primary open angle glaucoma; C/D cup-to-disc ratio; HVF humphrey visual field; GVF goldmann visual field; OCT optical coherence tomography; IOP intraocular pressure; BRVO Branch retinal vein occlusion; CRVO central retinal vein occlusion; CRAO central retinal artery occlusion; BRAO branch retinal artery occlusion; RT retinal tear; SB scleral buckle; PPV pars plana vitrectomy; VH Vitreous hemorrhage; PRP panretinal laser photocoagulation; IVK intravitreal kenalog; VMT vitreomacular traction; MH Macular hole;  NVD neovascularization of the disc; NVE neovascularization elsewhere; AREDS age related eye disease study; ARMD age related macular degeneration; POAG primary open angle glaucoma; EBMD epithelial/anterior basement membrane dystrophy; ACIOL anterior chamber intraocular lens; IOL intraocular lens; PCIOL posterior chamber intraocular lens; Phaco/IOL phacoemulsification with intraocular lens placement; [place] photorefractive keratectomy; LASIK laser assisted in situ keratomileusis; HTN hypertension; DM diabetes mellitus; COPD chronic obstructive pulmonary disease

## 2018-02-08 ENCOUNTER — Encounter (INDEPENDENT_AMBULATORY_CARE_PROVIDER_SITE_OTHER): Payer: Self-pay | Admitting: Ophthalmology

## 2018-02-08 ENCOUNTER — Ambulatory Visit (INDEPENDENT_AMBULATORY_CARE_PROVIDER_SITE_OTHER): Payer: Medicare Other | Admitting: Ophthalmology

## 2018-02-08 DIAGNOSIS — H26492 Other secondary cataract, left eye: Secondary | ICD-10-CM

## 2018-02-08 DIAGNOSIS — H43822 Vitreomacular adhesion, left eye: Secondary | ICD-10-CM

## 2018-02-08 DIAGNOSIS — H3581 Retinal edema: Secondary | ICD-10-CM

## 2018-02-08 DIAGNOSIS — H43811 Vitreous degeneration, right eye: Secondary | ICD-10-CM

## 2018-02-08 DIAGNOSIS — H35342 Macular cyst, hole, or pseudohole, left eye: Secondary | ICD-10-CM

## 2018-02-08 DIAGNOSIS — H35372 Puckering of macula, left eye: Secondary | ICD-10-CM

## 2018-02-08 DIAGNOSIS — Z961 Presence of intraocular lens: Secondary | ICD-10-CM

## 2018-02-08 MED ORDER — PREDNISOLONE ACETATE 1 % OP SUSP: 1.0000 [drp] | Freq: Four times a day (QID) | OPHTHALMIC | 0 refills | Status: DC

## 2018-02-09 ENCOUNTER — Encounter (INDEPENDENT_AMBULATORY_CARE_PROVIDER_SITE_OTHER): Payer: Self-pay | Admitting: Ophthalmology

## 2018-02-27 NOTE — H&P (Signed)
Hannah Hooper is an 84 y.o. female.    Chief Complaint: decreased vision, left eye  HPI: Pt presents with several month history decreased vision OS. On exam noted to have ERM and vitreomacular traction, left eye.  Past Medical History:  Diagnosis Date  . Hypertension     Past Surgical History:  Procedure Laterality Date  . bunions  1990  . CARPOMETACARPAL ([facility]) FUSION OF THUMB Left 06/24/2016   Procedure: CARPOMETACARPAL [facility]) ARthroplasty OF THUMB;  Surgeon: Hessie Knows, MD;  Location: [facility] ORS;  Service: Orthopedics;  Laterality: Left;  . CATARACT EXTRACTION    . EYE SURGERY Bilateral 2014   cataract surgery  . SHOULDER ARTHROSCOPY Right 2014  . TONSILLECTOMY     as a child  . ULNAR NERVE TRANSPOSITION Right     Family History  Problem Relation Age of Onset  . Breast cancer Mother 18   Social History:  reports that she has never smoked. She has never used smokeless tobacco. She reports that she drinks alcohol. She reports that she does not use drugs.  Allergies: No Known Allergies  No medications prior to admission.    Review of systems otherwise negative  There were no vitals taken for this visit.  Physical exam: Mental status: oriented x3. Eyes: See eye exam associated with this date of surgery Ears, Nose, Throat: within normal limits Neck: Within Normal limits General: within normal limits Chest: Within normal limits Breast: deferred Heart: Within normal limits Abdomen: Within normal limits GU: deferred Extremities: within normal limits Skin: within normal limits  Assessment/Plan 1. 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 Vitreoretinal Surgeon [facility]

## 2018-02-28 ENCOUNTER — Other Ambulatory Visit: Payer: Self-pay

## 2018-02-28 ENCOUNTER — Encounter (HOSPITAL_COMMUNITY): Payer: Self-pay | Admitting: *Deleted

## 2018-02-28 NOTE — Progress Notes (Signed)
Patient denies chest pain, shortness of breath, or cardiology visit.

## 2018-03-02 ENCOUNTER — Other Ambulatory Visit: Payer: Self-pay

## 2018-03-02 ENCOUNTER — Encounter (HOSPITAL_COMMUNITY): Payer: Self-pay | Admitting: *Deleted

## 2018-03-02 ENCOUNTER — Ambulatory Visit (HOSPITAL_COMMUNITY): Payer: Medicare Other | Admitting: Anesthesiology

## 2018-03-02 ENCOUNTER — Ambulatory Visit (HOSPITAL_COMMUNITY)
Admission: RE | Admit: 2018-03-02 | Discharge: 2018-03-02 | Disposition: A | Discharge status: Home / Self Care | Payer: Medicare Other | Source: Ambulatory Visit | Attending: Ophthalmology | Admitting: Ophthalmology

## 2018-03-02 ENCOUNTER — Encounter (HOSPITAL_COMMUNITY)
Admission: RE | Disposition: A | Discharge status: Home / Self Care | Payer: Self-pay | Source: Ambulatory Visit | Attending: Ophthalmology

## 2018-03-02 DIAGNOSIS — H35342 Macular cyst, hole, or pseudohole, left eye: Secondary | ICD-10-CM

## 2018-03-02 DIAGNOSIS — I10 Essential (primary) hypertension: Secondary | ICD-10-CM | POA: Diagnosis not present

## 2018-03-02 DIAGNOSIS — H43822 Vitreomacular adhesion, left eye: Secondary | ICD-10-CM | POA: Diagnosis not present

## 2018-03-02 DIAGNOSIS — Z79899 Other long term (current) drug therapy: Secondary | ICD-10-CM | POA: Diagnosis not present

## 2018-03-02 HISTORY — DX: Chronic kidney disease, stage 3 (moderate): N18.3

## 2018-03-02 HISTORY — PX: GAS/FLUID EXCHANGE: SHX5334

## 2018-03-02 HISTORY — DX: Disorder of arteries and arterioles, unspecified: I77.9

## 2018-03-02 HISTORY — PX: MEMBRANE PEEL: SHX5967

## 2018-03-02 HISTORY — DX: Peripheral vascular disease, unspecified: I73.9

## 2018-03-02 HISTORY — DX: Lesion of ulnar nerve, unspecified upper limb: G56.20

## 2018-03-02 HISTORY — DX: Benign neoplasm of colon, unspecified: D12.6

## 2018-03-02 HISTORY — DX: Chronic kidney disease, stage 3 unspecified: N18.30

## 2018-03-02 HISTORY — DX: Diverticulosis of intestine, part unspecified, without perforation or abscess without bleeding: K57.90

## 2018-03-02 HISTORY — DX: Unspecified osteoarthritis, unspecified site: M19.90

## 2018-03-02 HISTORY — PX: PARS PLANA VITRECTOMY: SHX2166

## 2018-03-02 HISTORY — DX: Hypertensive chronic kidney disease with stage 1 through stage 4 chronic kidney disease, or unspecified chronic kidney disease: I12.9

## 2018-03-02 HISTORY — DX: Hyperlipidemia, unspecified: E78.5

## 2018-03-02 LAB — BASIC METABOLIC PANEL
ANION GAP: 9 (ref 5–15)
BUN: 17 mg/dL (ref 8–23)
CHLORIDE: 106 mmol/L (ref 98–111)
CO2: 25 mmol/L (ref 22–32)
Calcium: 9.6 mg/dL (ref 8.9–10.3)
Creatinine, Ser: 1.1 mg/dL — ABNORMAL HIGH (ref 0.44–1.00)
GFR calc Af Amer: 52 mL/min — ABNORMAL LOW (ref >60)
GFR calc non Af Amer: 45 mL/min — ABNORMAL LOW (ref >60)
GLUCOSE: 99 mg/dL (ref 70–99)
POTASSIUM: 4 mmol/L (ref 3.5–5.1)
Sodium: 140 mmol/L (ref 135–145)

## 2018-03-02 SURGERY — PARS PLANA VITRECTOMY WITH 25 GAUGE
Anesthesia: General | Site: Eye | Laterality: Left

## 2018-03-02 MED ORDER — ATROPINE SULFATE 1 % OP SOLN
OPHTHALMIC | Status: AC
  Filled 2018-03-02: qty 5

## 2018-03-02 MED ORDER — EPINEPHRINE PF 1 MG/ML IJ SOLN
INTRAMUSCULAR | Status: AC
  Filled 2018-03-02: qty 1

## 2018-03-02 MED ORDER — ACETAZOLAMIDE SODIUM 500 MG IJ SOLR
INTRAMUSCULAR | Status: AC
  Filled 2018-03-02: qty 500

## 2018-03-02 MED ORDER — SODIUM CHLORIDE 0.9 % IV SOLN
INTRAVENOUS | Status: DC | PRN
  Administered 2018-03-02: 15 ug/min via INTRAVENOUS

## 2018-03-02 MED ORDER — TROPICAMIDE 1 % OP SOLN
1.0000 [drp] | OPHTHALMIC | Status: AC | PRN
  Administered 2018-03-02 (×3): 1 [drp] via OPHTHALMIC
  Filled 2018-03-02: qty 15

## 2018-03-02 MED ORDER — TRIAMCINOLONE ACETONIDE 40 MG/ML IJ SUSP
INTRAMUSCULAR | Status: AC
  Filled 2018-03-02: qty 5

## 2018-03-02 MED ORDER — PHENYLEPHRINE HCL 10 % OP SOLN
1.0000 [drp] | OPHTHALMIC | Status: AC | PRN
  Administered 2018-03-02 (×3): 1 [drp] via OPHTHALMIC
  Filled 2018-03-02: qty 5

## 2018-03-02 MED ORDER — ATROPINE SULFATE 1 % OP SOLN
OPHTHALMIC | Status: DC | PRN
  Administered 2018-03-02: 1 [drp] via OPHTHALMIC

## 2018-03-02 MED ORDER — PROPOFOL 10 MG/ML IV BOLUS
INTRAVENOUS | Status: DC | PRN
  Administered 2018-03-02: 110 mg via INTRAVENOUS

## 2018-03-02 MED ORDER — LIDOCAINE 2% (20 MG/ML) 5 ML SYRINGE
INTRAMUSCULAR | Status: DC | PRN
  Administered 2018-03-02: 40 mg via INTRAVENOUS

## 2018-03-02 MED ORDER — SUGAMMADEX SODIUM 200 MG/2ML IV SOLN
INTRAVENOUS | Status: DC | PRN
  Administered 2018-03-02: 200 mg via INTRAVENOUS

## 2018-03-02 MED ORDER — PROPARACAINE HCL 0.5 % OP SOLN
1.0000 [drp] | OPHTHALMIC | Status: AC | PRN
  Administered 2018-03-02 (×3): 1 [drp] via OPHTHALMIC
  Filled 2018-03-02: qty 15

## 2018-03-02 MED ORDER — SODIUM CHLORIDE 0.9 % IJ SOLN
INTRAMUSCULAR | Status: AC
  Filled 2018-03-02: qty 10

## 2018-03-02 MED ORDER — LIDOCAINE HCL (PF) 4 % IJ SOLN
INTRAMUSCULAR | Status: AC
  Filled 2018-03-02: qty 5

## 2018-03-02 MED ORDER — LIDOCAINE HCL (PF) 2 % IJ SOLN
INTRAMUSCULAR | Status: AC
  Filled 2018-03-02: qty 10

## 2018-03-02 MED ORDER — STERILE WATER FOR INJECTION IJ SOLN
INTRAMUSCULAR | Status: DC | PRN
  Administered 2018-03-02: 10 mL

## 2018-03-02 MED ORDER — DORZOLAMIDE HCL-TIMOLOL MAL 2-0.5 % OP SOLN
OPHTHALMIC | Status: AC
  Filled 2018-03-02: qty 10

## 2018-03-02 MED ORDER — PREDNISOLONE ACETATE 1 % OP SUSP
OPHTHALMIC | Status: DC | PRN
  Administered 2018-03-02: 1 [drp] via OPHTHALMIC

## 2018-03-02 MED ORDER — TRIAMCINOLONE ACETONIDE 40 MG/ML IJ SUSP
INTRAMUSCULAR | Status: DC | PRN
  Administered 2018-03-02: 40 mg via INTRAMUSCULAR

## 2018-03-02 MED ORDER — INDOCYANINE GREEN 25 MG IV SOLR
INTRAVENOUS | Status: DC | PRN
  Administered 2018-03-02: 25 mg via OPHTHALMIC

## 2018-03-02 MED ORDER — 0.9 % SODIUM CHLORIDE (POUR BTL) OPTIME
TOPICAL | Status: DC | PRN
  Administered 2018-03-02: 200 mL

## 2018-03-02 MED ORDER — SODIUM CHLORIDE 0.9 % IV SOLN
INTRAVENOUS | Status: DC
  Administered 2018-03-02 (×2): via INTRAVENOUS

## 2018-03-02 MED ORDER — FENTANYL CITRATE (PF) 250 MCG/5ML IJ SOLN
INTRAMUSCULAR | Status: DC | PRN
  Administered 2018-03-02 (×2): 50 ug via INTRAVENOUS
  Administered 2018-03-02: 25 ug via INTRAVENOUS

## 2018-03-02 MED ORDER — NA CHONDROIT SULF-NA HYALURON 40-30 MG/ML IO SOLN
INTRAOCULAR | Status: AC
  Filled 2018-03-02: qty 1

## 2018-03-02 MED ORDER — BALANCED SALT IO SOLN
INTRAOCULAR | Status: DC | PRN
  Administered 2018-03-02: 15 mL via INTRAOCULAR

## 2018-03-02 MED ORDER — STERILE WATER FOR INJECTION IJ SOLN
INTRAMUSCULAR | Status: AC
  Filled 2018-03-02: qty 10

## 2018-03-02 MED ORDER — GATIFLOXACIN 0.5 % OP SOLN
OPHTHALMIC | Status: AC
  Filled 2018-03-02: qty 2.5

## 2018-03-02 MED ORDER — POLYMYXIN B SULFATE 500000 UNITS IJ SOLR
INTRAMUSCULAR | Status: AC
  Filled 2018-03-02: qty 500000

## 2018-03-02 MED ORDER — BRIMONIDINE TARTRATE 0.2 % OP SOLN
OPHTHALMIC | Status: DC | PRN
  Administered 2018-03-02: 1 [drp] via OPHTHALMIC

## 2018-03-02 MED ORDER — CARBACHOL 0.01 % IO SOLN
INTRAOCULAR | Status: AC
  Filled 2018-03-02: qty 1.5

## 2018-03-02 MED ORDER — BUPIVACAINE HCL (PF) 0.75 % IJ SOLN
INTRAMUSCULAR | Status: AC
  Filled 2018-03-02: qty 10

## 2018-03-02 MED ORDER — PREDNISOLONE ACETATE 1 % OP SUSP
OPHTHALMIC | Status: AC
  Filled 2018-03-02: qty 5

## 2018-03-02 MED ORDER — ONDANSETRON HCL 4 MG/2ML IJ SOLN
INTRAMUSCULAR | Status: DC | PRN
  Administered 2018-03-02: 4 mg via INTRAVENOUS

## 2018-03-02 MED ORDER — EPINEPHRINE PF 1 MG/ML IJ SOLN
INTRAMUSCULAR | Status: DC | PRN
  Administered 2018-03-02: .3 mL

## 2018-03-02 MED ORDER — TOBRAMYCIN-DEXAMETHASONE 0.3-0.1 % OP OINT
TOPICAL_OINTMENT | OPHTHALMIC | Status: AC
  Filled 2018-03-02: qty 3.5

## 2018-03-02 MED ORDER — STERILE WATER FOR INJECTION IJ SOLN
INTRAMUSCULAR | Status: DC | PRN
  Administered 2018-03-02: 20 mL

## 2018-03-02 MED ORDER — FENTANYL CITRATE (PF) 250 MCG/5ML IJ SOLN
INTRAMUSCULAR | Status: AC
  Filled 2018-03-02: qty 5

## 2018-03-02 MED ORDER — BSS PLUS IO SOLN
INTRAOCULAR | Status: AC
  Filled 2018-03-02: qty 500

## 2018-03-02 MED ORDER — BRIMONIDINE TARTRATE 0.2 % OP SOLN
OPHTHALMIC | Status: AC
Start: 2018-03-02 — End: ?
  Filled 2018-03-02: qty 5

## 2018-03-02 MED ORDER — INDOCYANINE GREEN 25 MG IV SOLR
INTRAVENOUS | Status: AC
  Filled 2018-03-02: qty 25

## 2018-03-02 MED ORDER — BACITRACIN-POLYMYXIN B 500-10000 UNIT/GM OP OINT
TOPICAL_OINTMENT | OPHTHALMIC | Status: DC | PRN
  Administered 2018-03-02: 1 via OPHTHALMIC

## 2018-03-02 MED ORDER — DORZOLAMIDE HCL-TIMOLOL MAL 2-0.5 % OP SOLN
OPHTHALMIC | Status: DC | PRN
  Administered 2018-03-02: 1 [drp] via OPHTHALMIC

## 2018-03-02 MED ORDER — ROCURONIUM BROMIDE 10 MG/ML (PF) SYRINGE
PREFILLED_SYRINGE | INTRAVENOUS | Status: DC | PRN
  Administered 2018-03-02: 40 mg via INTRAVENOUS

## 2018-03-02 MED ORDER — ATROPINE SULFATE 1 % OP SOLN
1.0000 [drp] | OPHTHALMIC | Status: AC | PRN
  Administered 2018-03-02 (×3): 1 [drp] via OPHTHALMIC
  Filled 2018-03-02: qty 5

## 2018-03-02 MED ORDER — BACITRACIN-POLYMYXIN B 500-10000 UNIT/GM OP OINT
TOPICAL_OINTMENT | OPHTHALMIC | Status: AC
  Filled 2018-03-02: qty 3.5

## 2018-03-02 MED ORDER — BSS IO SOLN
INTRAOCULAR | Status: AC
  Filled 2018-03-02: qty 15

## 2018-03-02 MED ORDER — NA CHONDROIT SULF-NA HYALURON 40-30 MG/ML IO SOLN
INTRAOCULAR | Status: DC | PRN
  Administered 2018-03-02: 0.5 mL via INTRAOCULAR

## 2018-03-02 MED ORDER — DEXAMETHASONE SODIUM PHOSPHATE 10 MG/ML IJ SOLN
INTRAMUSCULAR | Status: AC
  Filled 2018-03-02: qty 1

## 2018-03-02 MED ORDER — BSS PLUS IO SOLN
INTRAOCULAR | Status: DC | PRN
  Administered 2018-03-02: 1 via INTRAOCULAR

## 2018-03-02 MED ORDER — CEFTAZIDIME 1 G IJ SOLR
INTRAMUSCULAR | Status: AC
  Filled 2018-03-02: qty 1

## 2018-03-02 SURGICAL SUPPLY — 66 items
APPLICATOR COTTON TIP 6 STRL (MISCELLANEOUS) ×6 IMPLANT
APPLICATOR COTTON TIP 6IN STRL (MISCELLANEOUS) ×18
APPLICATOR DR MATTHEWS STRL (MISCELLANEOUS) IMPLANT
BLADE EYE CATARACT 19 1.4 BEAV (BLADE) IMPLANT
BNDG EYE OVAL (MISCELLANEOUS) ×3 IMPLANT
CABLE BIPOLOR RESECTION CORD (MISCELLANEOUS) ×3 IMPLANT
CANNULA ANT CHAM MAIN (OPHTHALMIC RELATED) IMPLANT
CANNULA FLEX TIP 25G (CANNULA) ×3 IMPLANT
CANNULA TROCAR 23 GA VLV (OPHTHALMIC) IMPLANT
CANNULA VLV SOFT TIP 25GA (OPHTHALMIC) IMPLANT
CLOSURE STERI-STRIP 1/2X4 (GAUZE/BANDAGES/DRESSINGS) ×1
CLSR STERI-STRIP ANTIMIC 1/2X4 (GAUZE/BANDAGES/DRESSINGS) ×2 IMPLANT
COTTONBALL LRG STERILE PKG (GAUZE/BANDAGES/DRESSINGS) ×9 IMPLANT
DRAPE MICROSCOPE LEICA 46X105 (MISCELLANEOUS) ×3 IMPLANT
DRAPE OPHTHALMIC 77X100 STRL (CUSTOM PROCEDURE TRAY) ×3 IMPLANT
ERASER HMR WETFIELD 23G BP (MISCELLANEOUS) IMPLANT
FILTER BLUE MILLIPORE (MISCELLANEOUS) IMPLANT
FILTER STRAW FLUID ASPIR (MISCELLANEOUS) IMPLANT
FORCEPS GRIESHABER ILM 25G A (INSTRUMENTS) IMPLANT
FORCEPS GRIESHABER MAX 25G (MISCELLANEOUS) ×3 IMPLANT
GAS AUTO FILL CONSTEL (OPHTHALMIC)
GAS AUTO FILL CONSTELLATION (OPHTHALMIC) IMPLANT
GAS OPHTHALMIC (MISCELLANEOUS) ×3 IMPLANT
GLOVE BIO SURGEON STRL SZ7.5 (GLOVE) ×3 IMPLANT
GLOVE BIOGEL M 7.0 STRL (GLOVE) ×3 IMPLANT
GLOVE SKINSENSE NS SZ7.0 (GLOVE) ×2
GLOVE SKINSENSE STRL SZ7.0 (GLOVE) ×1 IMPLANT
GOWN STRL REUS W/ TWL LRG LVL3 (GOWN DISPOSABLE) ×2 IMPLANT
GOWN STRL REUS W/ TWL XL LVL3 (GOWN DISPOSABLE) ×1 IMPLANT
GOWN STRL REUS W/TWL LRG LVL3 (GOWN DISPOSABLE) ×4
GOWN STRL REUS W/TWL XL LVL3 (GOWN DISPOSABLE) ×2
KIT BASIN OR (CUSTOM PROCEDURE TRAY) ×3 IMPLANT
KIT PERFLUORON PROCEDURE 5ML (MISCELLANEOUS) IMPLANT
LENS MACULAR ASPHERIC CONSTEL (OPHTHALMIC) IMPLANT
LENS VITRECTOMY FLAT OCLR DISP (MISCELLANEOUS) IMPLANT
LOOP FINESSE 25 GA (MISCELLANEOUS) IMPLANT
MICROPICK 25G (MISCELLANEOUS)
NEEDLE 18GX1X1/2 (RX/OR ONLY) (NEEDLE) ×3 IMPLANT
NEEDLE 25GX 5/8IN NON SAFETY (NEEDLE) ×9 IMPLANT
NEEDLE HYPO 30X.5 LL (NEEDLE) ×6 IMPLANT
NEEDLE PRECISIONGLIDE 27X1.5 (NEEDLE) IMPLANT
NS IRRIG 1000ML POUR BTL (IV SOLUTION) ×3 IMPLANT
PACK VITRECTOMY CUSTOM (CUSTOM PROCEDURE TRAY) ×3 IMPLANT
PAD ARMBOARD 7.5X6 YLW CONV (MISCELLANEOUS) ×6 IMPLANT
PAK PIK VITRECTOMY CVS 25GA (OPHTHALMIC) ×3 IMPLANT
PENCIL BIPOLAR 25GA STR DISP (OPHTHALMIC RELATED) ×3 IMPLANT
PICK MICROPICK 25G (MISCELLANEOUS) IMPLANT
PROBE DIATHERMY DSP 27GA (MISCELLANEOUS) IMPLANT
PROBE ENDO DIATHERMY 25G (MISCELLANEOUS) IMPLANT
PROBE LASER ILLUM FLEX CVD 25G (OPHTHALMIC) IMPLANT
REPL STRA BRUSH NEEDLE (NEEDLE) IMPLANT
RESERVOIR BACK FLUSH (MISCELLANEOUS) IMPLANT
RETRACTOR IRIS FLEX 25G GRIESH (INSTRUMENTS) IMPLANT
SET INJECTOR OIL FLUID CONSTEL (OPHTHALMIC) IMPLANT
SOLUTION ANTI FOG 6CC (MISCELLANEOUS) ×3 IMPLANT
SPONGE SURGIFOAM ABS GEL 12-7 (HEMOSTASIS) IMPLANT
STOPCOCK 4 WAY LG BORE MALE ST (IV SETS) IMPLANT
SUT VICRYL 7 0 TG140 8 (SUTURE) ×3 IMPLANT
SYR 10ML LL (SYRINGE) ×3 IMPLANT
SYR 20CC LL (SYRINGE) ×3 IMPLANT
SYR 5ML LL (SYRINGE) ×3 IMPLANT
SYR BULB 3OZ (MISCELLANEOUS) ×3 IMPLANT
SYR TB 1ML LUER SLIP (SYRINGE) ×6 IMPLANT
TOWEL NATURAL 6PK STERILE (DISPOSABLE) ×3 IMPLANT
TUBING HIGH PRESS EXTEN 6IN (TUBING) ×3 IMPLANT
WATER STERILE IRR 1000ML POUR (IV SOLUTION) ×3 IMPLANT

## 2018-03-02 NOTE — Op Note (Signed)
Date of procedure:  03/02/2018   Surgeon: Yosselin Zoeller, MD, PhD   Pre-operative Diagnoses:  Epiretinal membrane, left eye Vitreomacular traction, left eye Macular cyst, left eye   Post-operative diagnosis:  same   Anesthesia: General   Procedure:   1. 25 gauge pars plana vitrectomy, Left Eye 2. Indocyanine green stain, Left Eye 3. Internal Limiting Membrane peel, Left Eye  4. Injection 20% SF6, Left Eye    Indications for procedure: The patient presented with decreased vision and distortion due to ERM, VMT and macular cyst in the left eye. After discussing the risks, benefits, and alternatives to surgery, the patient electively decided to undergo surgical repair and informed consent was obtained.  The surgery was an attempt to close the macular hole and potentially improve the vision within the reasonable expectations of the surgeon.    Procedure in Detail:    The patient was met in the pre-operative holding area where their identification data was verified.  It was noted that there was a signed, informed consent in the chart and the Left Eye eye was verbally verified by the patient as the operative eye and was marked with a marking pen. The patient was then taken to the operating room and placed in the supine position. General endotracheal anesthesia was induced.  The eye was then prepped with 5% betadine and draped in the normal fashion for ophthalmic surgery. The microscope was draped and swung into position, and a secondary time-out was performed to identify the correct patient, eyes, procedures, and any allergies.   A 25 gauge trocar was inserted in a 30-45 degrees fashion into the inferotemporal quadrant 3.5 mm posterior to the limbus in this pseudophakic patient.  Correct positioning within the vitreous was verified externally with the light pipe.  The infusion was then connected to the cannula and BSS infusion was commenced.  Additional ports were placed in the superonasal and  superotemporal quadrants. Viscoat was placed on the cornea and direct vitrectomy was performed. The BIOM was used to visualize the posterior segment while the core vitrectomy was completed.  The patient had visible vitreous traction on the fovea and the disc. A posterior vitreous detachment was confirmed using suction over the optic nerve head and lifting anteriorly. The remaining vitreous was removed. Kenalog was used to aid in this process.      Pre-diluted indocyanine green was then used to stain the internal limiting membrane and counter-stain the ERM. A macular contact lens was placed on the eye. End-grasping ILM forceps were used to create an opening in the ILM and the ILM was peeled fully from the macula taking care to avoid traction on the macular cyst.     The wide angle viewing system was brought back into position. Scleral depression was performed and used to meticulously shave the thick and adherent vitreous base. No retinal tears were noted. An air fluid exchange was performed.    The superotemporal port was then removed and sutured with 7-0 vicryl, there was no leakage. 20% SF6 gas was connected to the infusion line and gas was injected into the posterior segment while venting air through the superonasal trocar using the extrusion cannula. Once a full, 40cc of gas was vented through the eye, the infusion port and venting ports were removed and they were sutured with 7-0 vicryl.  There was no leakage from the sclerotomy sites.   Subconjunctival injections of kefzol + bacitracin + polymixin b and kenalog were then administered, and antibiotic and steroid drops   left wrist. The patient was then taken to the post-operative area for recovery having tolerated the procedure well. She was instructed to perform face down positioning  postoperatively and to follow up in clinic the following morning as scheduled.  Estimate blood lost: none Complications: None

## 2018-03-02 NOTE — Progress Notes (Addendum)
[facility] Note  03/03/2018     CHIEF COMPLAINT Patient presents for Post-op Follow-up   HISTORY OF PRESENT ILLNESS: Hannah Hooper is a 84 y.o. female who presents to the clinic today for:   HPI    Post-op Follow-up    In left eye.  Discomfort includes none.  Negative for pain, itching, foreign body sensation, tearing, discharge and floaters.  Vision is stable.  I, the attending physician,  performed the HPI with the patient and updated documentation appropriately.          Comments    POV 1; S/P 25g PPV/MP/FAX/20% SF6 OS (08.15.19); Pt states she rested well; Pt states she slept on right side; Pt states she had to take tylenol twice; Pt states she is not in pian this AM; Pt presents with shield on and gas bracelet around left wrist;        Last edited by Bernarda Caffey, MD on 03/03/2018  8:43 AM. (History)    Pt states she has not noticed any real change in [place]; Pt states   Referring physician: Kirk Ruths, MD [facility] [place], [place] [address]  HISTORICAL INFORMATION:   Selected notes from the MEDICAL RECORD NUMBER Referred by Dr. Marvel Plan for concern of mac hole OS;  LEE- 06.04.19 ([place]) [BCVA OD: 20/30-1 OS: 20/80-1] Ocular Hx- pseudophakia (2014) PMH- arthritis, HTN, high cholesterol    CURRENT MEDICATIONS: Current Outpatient Medications (Ophthalmic Drugs)  Medication Sig  . prednisoLONE acetate (PRED FORTE) 1 % ophthalmic suspension Place 1 drop into the left eye 4 (four) times daily. (Patient not taking: Reported on 02/23/2018)  . Tetrahydrozoline HCl (VISINE OP) Place 1 drop into both eyes daily as needed (for burning).    No current facility-administered medications for this visit.  (Ophthalmic Drugs)   Current Outpatient Medications (Other)  Medication Sig  . amLODipine (NORVASC) 5 MG tablet Take 5 mg by mouth daily.  Marland Kitchen HYDROcodone-acetaminophen (NORCO) 5-325 MG tablet Take 1 tablet  by mouth every 6 (six) hours as needed for moderate pain. (Patient not taking: Reported on 02/23/2018)  . lisinopril-hydrochlorothiazide (PRINZIDE,ZESTORETIC) 20-12.5 MG tablet Take 1 tablet by mouth daily.  . pravastatin (PRAVACHOL) 80 MG tablet Take 80 mg by mouth at bedtime.  . sulfaSALAzine (AZULFIDINE) 500 MG tablet Take 1,000 mg by mouth daily.   No current facility-administered medications for this visit.  (Other)      REVIEW OF SYSTEMS: ROS    Positive for: Genitourinary, Cardiovascular, Eyes   Negative for: Constitutional, Gastrointestinal, Neurological, Skin, Musculoskeletal, HENT, Endocrine, Respiratory, Psychiatric, Allergic/Imm, Heme/Lymph   Last edited by Cherrie Gauze, COA on 03/03/2018  8:29 AM. (History)       ALLERGIES No Known Allergies  PAST MEDICAL HISTORY Past Medical History:  Diagnosis Date  . Benign neoplasm of colon, unspecified   . Carotid artery disease ([place])   . Diverticulosis   . Hyperlipemia   . Hypertension   . Hypertensive kidney disease with CKD stage III ([place])   . Lesion of ulnar nerve   . Osteoarthritis    Past Surgical History:  Procedure Laterality Date  . bunions  1990  . CARPOMETACARPAL ([facility]) FUSION OF THUMB Left 06/24/2016   Procedure: CARPOMETACARPAL [facility]) ARthroplasty OF THUMB;  Surgeon: Hessie Knows, MD;  Location: [facility] ORS;  Service: Orthopedics;  Laterality: Left;  . CATARACT EXTRACTION    . EYE SURGERY Bilateral 2014   cataract surgery  .  GAS/FLUID EXCHANGE Left 03/02/2018   Procedure: GAS/FLUID EXCHANGE;  Surgeon: Bernarda Caffey, MD;  Location: [place];  Service: Ophthalmology;  Laterality: Left;  . MEMBRANE PEEL Left 03/02/2018   Procedure: MEMBRANE PEEL;  Surgeon: Bernarda Caffey, MD;  Location: [place];  Service: Ophthalmology;  Laterality: Left;  . PARS PLANA VITRECTOMY Left 03/02/2018   Procedure: PARS PLANA VITRECTOMY WITH 25 GAUGE;  Surgeon: Bernarda Caffey, MD;  Location: [place];  Service: Ophthalmology;  Laterality: Left;  .  SHOULDER ARTHROSCOPY Right 2014  . TONSILLECTOMY     as a child  . ULNAR NERVE TRANSPOSITION Right     FAMILY HISTORY Family History  Problem Relation Age of Onset  . Breast cancer Mother 67    SOCIAL HISTORY Social History   Tobacco Use  . Smoking status: Never Smoker  . Smokeless tobacco: Never Used  Substance Use Topics  . Alcohol use: Yes    Comment: occasionally mixed drink  . Drug use: No         OPHTHALMIC EXAM:  Base Eye Exam    Visual Acuity (Snellen - Linear)      Right Left   Dist [place] 20/25 -1 CF @ 2'   Dist ph [place] NI NI       Tonometry (Tonopen, 8:35 AM)      Right Left   Pressure  17       Pupils      Dark Light Shape React APD   Right        Left 4 4 Round NR None       Neuro/Psych    Oriented x3:  Yes   Mood/Affect:  Normal       Dilation    Left eye:  1.0% Mydriacyl, 2.5% Phenylephrine @ 8:35 AM        Slit Lamp and Fundus Exam    Slit Lamp Exam      Right Left   Lids/Lashes Dermatochalasis - upper lid Dermatochalasis - upper lid   Conjunctiva/Sclera White and quiet Temporal Pinguecula, Subconjunctival hemorrhage inferiorly greatest nasally, sutures intact   Cornea 2+ central Punctate epithelial erosions, Temporal Well healed cataract wounds 3+ diffuse Punctate epithelial erosions, irregular epi inferiorly, Temporal Well healed cataract wounds   Anterior Chamber Deep and quiet Deep , 1-2+ Cell   Iris Round and dilated Round and dilated   Lens PC IOL in good position PC IOL in good position, open PC   Vitreous Vitreous syneresis, Posterior vitreous detachment post vitrectomy, good gas fill       Fundus Exam      Right Left   Disc  blurred margins with nasal elevation   C/D Ratio 0.4 0.3   Macula  Flat under gas, macular cyst improving,    Vessels  Mild Vascular attenuation   Periphery  Attached, confluent mid-zonal drusen          IMAGING AND PROCEDURES  Imaging and Procedures for @TODAY @            ASSESSMENT/PLAN:    ICD-10-CM   1. 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 Macular retinal cyst of left eye H35.342   3. Epiretinal membrane (ERM) of left eye H35.372   4. Retinal edema H35.81   5. Posterior vitreous detachment of right eye H43.811   6. Pseudophakia of both eyes Z96.1   7. PCO (posterior capsular opacification), left H26.492     1-4. 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 - Reviewed the  natural history, anatomy, potential for loss of vision, and treatment options including vitrectomy techniques and the complications of endophthalmitis, retinal detachment, vitreous hemorrhage, cataract progression and permanent vision loss discussed with the patient. - pt with decreased BCVA to 20/150 from 20/60 - OCT with persistent traction / VMT -- no release or improvement in VMT or cystoid intraretinal changes - POV1; s/p 25 gauge PPV/MP/FAX/20% SF6 OS (08.15.19)             - doing well this morning             - IOP good at 17 today             - start   PF 4x/day OS                         zymaxid QID OS                         Atropine BID OS                         Alphagan BID OS                         PSO ung QID OS             - cont face down positioning x3 days; avoid laying flat on back             - eye shield when sleeping             - post op drop and positioning instructions reviewed             - tylenol/ibuprofen for pain - F/U 1 wk  5. PVD / vitreous syneresis OD  Discussed findings and prognosis  No RT or RD on 360 scleral depressed exam  Reviewed s/s of RT/RD  Strict return precautions for any such RT/RD signs/symptoms  6,7. Pseudophakia OU w/ visually significant PCO OS  - s/p CE/IOL OU  - s/p YAG capsulotomy OS 02/08/18  - completed PF QID OS x7 d  - PC opening looks great   Ophthalmic Meds Ordered this visit:  No orders of the defined types were placed in this encounter.      No follow-ups on file.  There are  no Patient Instructions on file for this visit.   Explained the diagnoses, plan, and follow up with the patient and they expressed understanding.  Patient expressed understanding of the importance of proper follow up care.   This document serves as a record of services personally performed by Gardiner Sleeper, MD, PhD. It was created on their behalf by Ernest Mallick, OA, an ophthalmic assistant. The creation of this record is the provider's dictation and/or activities during the visit.    Electronically signed by: Ernest Mallick, OA  07.23.2019 9:18 AM   This document serves as a record of services personally performed by Gardiner Sleeper, MD, PhD. It was created on their behalf by Catha Brow, [place], a certified ophthalmic assistant. The creation of this record is the provider's dictation and/or activities during the visit.  Electronically signed by: Catha Brow, COA  08.15.19 9:18 AM    Gardiner Sleeper, M.D., Ph.D. Diseases & Surgery of the Retina and Vitreous [facility]  I have reviewed the above documentation for accuracy and completeness,  and I agree with the above. Gardiner Sleeper, M.D., Ph.D. 03/03/18 9:18 AM     Abbreviations: M myopia (nearsighted); A astigmatism; H hyperopia (farsighted); P presbyopia; Mrx spectacle prescription;  CTL contact lenses; OD right eye; OS left eye; OU both eyes  XT exotropia; ET esotropia; PEK punctate epithelial keratitis; PEE punctate epithelial erosions; DES dry eye syndrome; MGD meibomian gland dysfunction; ATs artificial tears; PFAT's preservative free artificial tears; [place] nuclear sclerotic cataract; PSC posterior subcapsular cataract; ERM epi-retinal membrane; PVD posterior vitreous detachment; RD retinal detachment; DM diabetes mellitus; DR diabetic retinopathy; NPDR non-proliferative diabetic retinopathy; PDR proliferative diabetic retinopathy; CSME clinically significant macular edema; DME diabetic macular edema; dbh  dot blot hemorrhages; CWS cotton wool spot; POAG primary open angle glaucoma; C/D cup-to-disc ratio; HVF humphrey visual field; GVF goldmann visual field; OCT optical coherence tomography; IOP intraocular pressure; BRVO Branch retinal vein occlusion; CRVO central retinal vein occlusion; CRAO central retinal artery occlusion; BRAO branch retinal artery occlusion; RT retinal tear; SB scleral buckle; PPV pars plana vitrectomy; VH Vitreous hemorrhage; PRP panretinal laser photocoagulation; IVK intravitreal kenalog; VMT vitreomacular traction; MH Macular hole;  NVD neovascularization of the disc; NVE neovascularization elsewhere; AREDS age related eye disease study; ARMD age related macular degeneration; POAG primary open angle glaucoma; EBMD epithelial/anterior basement membrane dystrophy; ACIOL anterior chamber intraocular lens; IOL intraocular lens; PCIOL posterior chamber intraocular lens; Phaco/IOL phacoemulsification with intraocular lens placement; [place] photorefractive keratectomy; LASIK laser assisted in situ keratomileusis; HTN hypertension; DM diabetes mellitus; COPD chronic obstructive pulmonary disease

## 2018-03-02 NOTE — Anesthesia Preprocedure Evaluation (Signed)
Anesthesia Evaluation  Patient identified by MRN, date of birth, ID band Patient awake    Reviewed: Allergy & Precautions, NPO status , Patient's Chart, lab work & pertinent test results  Airway Mallampati: I  TM Distance: >3 FB Neck ROM: Full    Dental   Pulmonary    Pulmonary exam normal        Cardiovascular hypertension, Pt. on medications Normal cardiovascular exam     Neuro/Psych    GI/Hepatic   Endo/Other    Renal/GU Renal InsufficiencyRenal disease     Musculoskeletal   Abdominal   Peds  Hematology   Anesthesia Other Findings   Reproductive/Obstetrics                             Anesthesia Physical Anesthesia Plan  ASA: II  Anesthesia Plan: General   Post-op Pain Management:    Induction:   PONV Risk Score and Plan: 3 and Ondansetron and Treatment may vary due to age or medical condition  Airway Management Planned: Oral ETT  Additional Equipment:   Intra-op Plan:   Post-operative Plan: Extubation in OR  Informed Consent: I have reviewed the patients History and Physical, chart, labs and discussed the procedure including the risks, benefits and alternatives for the proposed anesthesia with the patient or authorized representative who has indicated his/her understanding and acceptance.     Plan Discussed with: CRNA and Surgeon  Anesthesia Plan Comments:         Anesthesia Quick Evaluation

## 2018-03-02 NOTE — Anesthesia Postprocedure Evaluation (Signed)
Anesthesia Post Note  Patient: Shaivi Rothschild Guerrette  Procedure(s) Performed: PARS PLANA VITRECTOMY WITH 25 GAUGE (Left Eye) MEMBRANE PEEL (Left Eye) GAS/FLUID EXCHANGE (Left Eye)     Patient location during evaluation: PACU Anesthesia Type: General Level of consciousness: awake and alert Pain management: pain level controlled Vital Signs Assessment: post-procedure vital signs reviewed and stable Respiratory status: spontaneous breathing, nonlabored ventilation, respiratory function stable and patient connected to nasal cannula oxygen Cardiovascular status: blood pressure returned to baseline and stable Postop Assessment: no apparent nausea or vomiting Anesthetic complications: no    Last Vitals:  Vitals:   03/02/18 1430 03/02/18 1445  BP: (!) 156/60 (!) 128/53  Pulse: 80 84  Resp: 11   Temp:    SpO2: 96% 97%    Last Pain:  Vitals:   03/02/18 1430  TempSrc:   PainSc: 0-No pain                 Joanthony Hamza DAVID

## 2018-03-02 NOTE — Brief Op Note (Signed)
03/02/2018  2:15 PM  PATIENT:  Hannah Hooper  84 y.o. female  PRE-OPERATIVE DIAGNOSIS:  vitreomacular traction with macular cyst and ERM, left eye  POST-OPERATIVE DIAGNOSIS:  vitreomacular traction with macular cyst and ERM, left eye  PROCEDURE:  Procedure(s): PARS PLANA VITRECTOMY WITH 25 GAUGE (Left) MEMBRANE PEEL (Left) GAS/FLUID EXCHANGE (Left)  SURGEON:  Surgeon(s) and Role:    Bernarda Caffey, MD - Primary  ASSISTANTS: Catha Brow, COA  ANESTHESIA:   general  EBL:  2 mL   BLOOD ADMINISTERED:none  DRAINS: none   LOCAL MEDICATIONS USED:  NONE  SPECIMEN:  No Specimen  DISPOSITION OF SPECIMEN:  N/A  COUNTS:  YES  TOURNIQUET:  * No tourniquets in log *  DICTATION: .Note written in EPIC  PLAN OF CARE: Discharge to home after PACU  PATIENT DISPOSITION:  PACU - hemodynamically stable.   Delay start of Pharmacological VTE agent (>24hrs) due to surgical blood loss or risk of bleeding: not applicable

## 2018-03-02 NOTE — Progress Notes (Deleted)
Hannah Hooper is an 84 y.o. female.    Chief Complaint: decreased vision, left eye  HPI: Pt presents with several month history decreased vision OS. On exam noted to have ERM and vitreomacular traction, left eye.  Past Medical History:  Diagnosis Date  . Benign neoplasm of colon, unspecified   . Carotid artery disease ([place])   . Diverticulosis   . Hyperlipemia   . Hypertension   . Hypertensive kidney disease with CKD stage III ([place])   . Lesion of ulnar nerve   . Osteoarthritis     Past Surgical History:  Procedure Laterality Date  . bunions  1990  . CARPOMETACARPAL ([facility]) FUSION OF THUMB Left 06/24/2016   Procedure: CARPOMETACARPAL [facility]) ARthroplasty OF THUMB;  Surgeon: Hessie Knows, MD;  Location: [facility] ORS;  Service: Orthopedics;  Laterality: Left;  . CATARACT EXTRACTION    . EYE SURGERY Bilateral 2014   cataract surgery  . GAS/FLUID EXCHANGE Left 03/02/2018   Procedure: GAS/FLUID EXCHANGE;  Surgeon: Bernarda Caffey, MD;  Location: [place];  Service: Ophthalmology;  Laterality: Left;  . MEMBRANE PEEL Left 03/02/2018   Procedure: MEMBRANE PEEL;  Surgeon: Bernarda Caffey, MD;  Location: [place];  Service: Ophthalmology;  Laterality: Left;  . PARS PLANA VITRECTOMY Left 03/02/2018   Procedure: PARS PLANA VITRECTOMY WITH 25 GAUGE;  Surgeon: Bernarda Caffey, MD;  Location: [place];  Service: Ophthalmology;  Laterality: Left;  . SHOULDER ARTHROSCOPY Right 2014  . TONSILLECTOMY     as a child  . ULNAR NERVE TRANSPOSITION Right     Family History  Problem Relation Age of Onset  . Breast cancer Mother 57   Social History:  reports that she has never smoked. She has never used smokeless tobacco. She reports that she drinks alcohol. She reports that she does not use drugs.  Allergies: No Known Allergies   (Not in a hospital admission)  Review of systems otherwise negative  There were no vitals taken for this visit.  Physical exam: Mental status: oriented x3. Eyes: See eye exam associated with  this date of surgery Ears, Nose, Throat: within normal limits Neck: Within Normal limits General: within normal limits Chest: Within normal limits Breast: deferred Heart: Within normal limits Abdomen: Within normal limits GU: deferred Extremities: within normal limits Skin: within normal limits  Assessment/Plan 1. Vitreomacular traction syndrome w/ macular cyst and ERM OS Plan: To [facility] for pars plana vitrectomy with membrane peel, possible gas, left eye, under general anesthesia Case scheduled for 8.15.19  Gardiner Sleeper, M.D., Ph.D. Vitreoretinal Surgeon [facility]

## 2018-03-02 NOTE — Interval H&P Note (Signed)
History and Physical Interval Note:  03/02/2018 11:40 AM  Hannah Hooper  has presented today for surgery, with the diagnosis of vitreomacular traction with macular cyst and ERM, left eye  The various methods of treatment have been discussed with the patient and family. After consideration of risks, benefits and other options for treatment, the patient has consented to  Procedure(s): PARS PLANA VITRECTOMY WITH 25 GAUGE (Left) MEMBRANE PEEL (Left) as a surgical intervention .  The patient's history has been reviewed, patient examined, no change in status, stable for surgery.  I have reviewed the patient's chart and labs.  Questions were answered to the patient's satisfaction.     Bernarda Caffey

## 2018-03-02 NOTE — Discharge Instructions (Addendum)
POSTOPERATIVE INSTRUCTIONS ° °Your doctor has performed vitreoretinal surgery on you at [place]. ° °- Keep eye patched and shielded until seen by Dr. Zamora 830 AM tomorrow in clinic °- Do not use drops until return °- FACE DOWN POSITIONING WHILE AWAKE °- Sleep with belly down or on right side, avoid laying flat on back.   ° °- No strenuous bending, stooping or lifting. ° °- You may not drive until further notice. ° °- If your doctor used a gas bubble in your eye during the procedure he will advise you on postoperative positioning. If you have a gas bubble you will be wearing a green bracelet that was applied in the operating room. The green bracelet should stay on as long as the gas bubble is in your eye. While the gas bubble is present you should not fly in an airplane. If you require general anesthesia while the gas bubble is present you must notify your anesthesiologist that an intraocular gas bubble is present so he can take the appropriate precautions. ° °- Tylenol or any other over-the-counter pain reliever can be used according to your doctor. If more pain medicine is required, your doctor will have a prescription for you. ° °- You may read, go up and down stairs, and watch television. ° ° ° ° Brian Zamora, M.D., Ph.D. ° ° °Post Anesthesia Home Care Instructions ° °Activity: °Get plenty of rest for the remainder of the day. A responsible individual must stay with you for 24 hours following the procedure.  °For the next 24 hours, DO NOT: °-Drive a car °-Operate machinery °-Drink alcoholic beverages °-Take any medication unless instructed by your physician °-Make any legal decisions or sign important papers. ° °Meals: °Start with liquid foods such as gelatin or soup. Progress to regular foods as tolerated. Avoid greasy, spicy, heavy foods. If nausea and/or vomiting occur, drink only clear liquids until the nausea and/or vomiting subsides. Call your physician if vomiting  continues. ° °Special Instructions/Symptoms: °Your throat may feel dry or sore from the anesthesia or the breathing tube placed in your throat during surgery. If this causes discomfort, gargle with warm salt water. The discomfort should disappear within 24 hours. ° °If you had a scopolamine patch placed behind your ear for the management of post- operative nausea and/or vomiting: ° °1. The medication in the patch is effective for 72 hours, after which it should be removed.  Wrap patch in a tissue and discard in the trash. Wash hands thoroughly with soap and water. °2. You may remove the patch earlier than 72 hours if you experience unpleasant side effects which may include dry mouth, dizziness or visual disturbances. °3. Avoid touching the patch. Wash your hands with soap and water after contact with the patch. °  ° °

## 2018-03-02 NOTE — Progress Notes (Signed)
Green Ophthalmic Gas Bracelet was place on patient left arm.

## 2018-03-02 NOTE — Transfer of Care (Signed)
Immediate Anesthesia Transfer of Care Note  Patient: Hannah Hooper  Procedure(s) Performed: PARS PLANA VITRECTOMY WITH 25 GAUGE (Left Eye) MEMBRANE PEEL (Left Eye) GAS/FLUID EXCHANGE (Left Eye)  Patient Location: PACU  Anesthesia Type:General  Level of Consciousness: drowsy and patient cooperative  Airway & Oxygen Therapy: Patient Spontanous Breathing and Patient connected to face mask oxygen  Post-op Assessment: Report given to RN and Post -op Vital signs reviewed and stable  Post vital signs: Reviewed and stable  Last Vitals:  Vitals Value Taken Time  BP 170/66 03/02/2018  2:00 PM  Temp    Pulse 83 03/02/2018  2:01 PM  Resp    SpO2 96 % 03/02/2018  2:01 PM  Vitals shown include unvalidated device data.  Last Pain:  Vitals:   03/02/18 0926  TempSrc:   PainSc: 0-No pain         Complications: No apparent anesthesia complications

## 2018-03-02 NOTE — Anesthesia Procedure Notes (Signed)
Procedure Name: Intubation Date/Time: 03/02/2018 11:57 AM Performed by: Mariea Clonts, CRNA Pre-anesthesia Checklist: Patient identified, Emergency Drugs available, Suction available and Patient being monitored Patient Re-evaluated:Patient Re-evaluated prior to induction Oxygen Delivery Method: Circle System Utilized Preoxygenation: Pre-oxygenation with 100% oxygen Induction Type: IV induction Ventilation: Mask ventilation without difficulty Laryngoscope Size: Mac and 3 Grade View: Grade II Tube type: Oral Tube size: 6.5 mm Number of attempts: 1 Airway Equipment and Method: Stylet and Oral airway Placement Confirmation: ETT inserted through vocal cords under direct vision,  positive ETCO2 and breath sounds checked- equal and bilateral Tube secured with: Tape Dental Injury: Teeth and Oropharynx as per pre-operative assessment

## 2018-03-03 ENCOUNTER — Encounter (HOSPITAL_COMMUNITY): Payer: Self-pay | Admitting: Ophthalmology

## 2018-03-03 ENCOUNTER — Ambulatory Visit (INDEPENDENT_AMBULATORY_CARE_PROVIDER_SITE_OTHER): Payer: Medicare Other | Admitting: Ophthalmology

## 2018-03-03 DIAGNOSIS — H43811 Vitreous degeneration, right eye: Secondary | ICD-10-CM

## 2018-03-03 DIAGNOSIS — H35342 Macular cyst, hole, or pseudohole, left eye: Secondary | ICD-10-CM | POA: Diagnosis not present

## 2018-03-03 DIAGNOSIS — H26492 Other secondary cataract, left eye: Secondary | ICD-10-CM

## 2018-03-03 DIAGNOSIS — H43822 Vitreomacular adhesion, left eye: Secondary | ICD-10-CM

## 2018-03-03 DIAGNOSIS — H3581 Retinal edema: Secondary | ICD-10-CM

## 2018-03-03 DIAGNOSIS — H35372 Puckering of macula, left eye: Secondary | ICD-10-CM

## 2018-03-03 DIAGNOSIS — Z961 Presence of intraocular lens: Secondary | ICD-10-CM

## 2018-03-08 NOTE — Progress Notes (Signed)
[facility] Note  03/10/2018     CHIEF COMPLAINT Patient presents for Post-op Follow-up   HISTORY OF PRESENT ILLNESS: Hannah Hooper is a 84 y.o. female who presents to the clinic today for:   HPI    Post-op Follow-up    In left eye.  Discomfort includes none.  Negative for pain, itching, foreign body sensation, tearing, discharge and floaters.  Vision is improved.  I, the attending physician,  performed the HPI with the patient and updated documentation appropriately.          Comments    POV 2; S/P 25g PPV/MP/FAX/20% SF6 OS (08.15.19); Pt states she is healing well; Pt states she is doing as much face down postioning as possible; Pt reports using gtts as directed; Pt reports she is able to see "better but nothing is very clear"; Pt reports OS is comfortable;        Last edited by Bernarda Caffey, MD on 03/10/2018  2:16 PM. (History)      Referring physician: Kirk Ruths, MD [place], [place] [address]  HISTORICAL INFORMATION:   Selected notes from the MEDICAL RECORD NUMBER Referred by Dr. Marvel Plan for concern of mac hole OS;  LEE- 06.04.19 ([place]) [BCVA OD: 20/30-1 OS: 20/80-1] Ocular Hx- pseudophakia (2014) PMH- arthritis, HTN, high cholesterol    CURRENT MEDICATIONS: Current Outpatient Medications (Ophthalmic Drugs)  Medication Sig  . bacitracin-polymyxin b (POLYSPORIN) ophthalmic ointment Place into the left eye 4 (four) times daily for 10 days. Place a 1/2 inch ribbon of ointment into the lower eyelid.  . prednisoLONE acetate (PRED FORTE) 1 % ophthalmic suspension Place 1 drop into the left eye 4 (four) times daily. (Patient not taking: Reported on 02/23/2018)  . Tetrahydrozoline HCl (VISINE OP) Place 1 drop into both eyes daily as needed (for burning).    No current facility-administered medications for this visit.  (Ophthalmic Drugs)   Current Outpatient Medications (Other)  Medication  Sig  . amLODipine (NORVASC) 5 MG tablet Take 5 mg by mouth daily.  Marland Kitchen HYDROcodone-acetaminophen (NORCO) 5-325 MG tablet Take 1 tablet by mouth every 6 (six) hours as needed for moderate pain. (Patient not taking: Reported on 02/23/2018)  . lisinopril-hydrochlorothiazide (PRINZIDE,ZESTORETIC) 20-12.5 MG tablet Take 1 tablet by mouth daily.  . pravastatin (PRAVACHOL) 80 MG tablet Take 80 mg by mouth at bedtime.  . sulfaSALAzine (AZULFIDINE) 500 MG tablet Take 1,000 mg by mouth daily.   No current facility-administered medications for this visit.  (Other)      REVIEW OF SYSTEMS: ROS    Positive for: Genitourinary, Cardiovascular, Eyes   Negative for: Constitutional, Gastrointestinal, Neurological, Skin, Musculoskeletal, HENT, Endocrine, Respiratory, Psychiatric, Allergic/Imm, Heme/Lymph   Last edited by Cherrie Gauze, COA on 03/10/2018  1:35 PM. (History)       ALLERGIES No Known Allergies  PAST MEDICAL HISTORY Past Medical History:  Diagnosis Date  . Benign neoplasm of colon, unspecified   . Carotid artery disease ([place])   . Diverticulosis   . Hyperlipemia   . Hypertension   . Hypertensive kidney disease with CKD stage III ([place])   . Lesion of ulnar nerve   . Osteoarthritis    Past Surgical History:  Procedure Laterality Date  . bunions  1990  . CARPOMETACARPAL ([facility]) FUSION OF THUMB Left 06/24/2016   Procedure: CARPOMETACARPAL [facility]) ARthroplasty OF THUMB;  Surgeon: Hessie Knows, MD;  Location: [facility];  Service: Orthopedics;  Laterality:  Left;  . CATARACT EXTRACTION    . EYE SURGERY Bilateral 2014   cataract surgery  . GAS/FLUID EXCHANGE Left 03/02/2018   Procedure: GAS/FLUID EXCHANGE;  Surgeon: Bernarda Caffey, MD;  Location: [place];  Service: Ophthalmology;  Laterality: Left;  . MEMBRANE PEEL Left 03/02/2018   Procedure: MEMBRANE PEEL;  Surgeon: Bernarda Caffey, MD;  Location: [place];  Service: Ophthalmology;  Laterality: Left;  . PARS PLANA VITRECTOMY Left 03/02/2018    Procedure: PARS PLANA VITRECTOMY WITH 25 GAUGE;  Surgeon: Bernarda Caffey, MD;  Location: [place];  Service: Ophthalmology;  Laterality: Left;  . SHOULDER ARTHROSCOPY Right 2014  . TONSILLECTOMY     as a child  . ULNAR NERVE TRANSPOSITION Right     FAMILY HISTORY Family History  Problem Relation Age of Onset  . Breast cancer Mother 56    SOCIAL HISTORY Social History   Tobacco Use  . Smoking status: Never Smoker  . Smokeless tobacco: Never Used  Substance Use Topics  . Alcohol use: Yes    Comment: occasionally mixed drink  . Drug use: No         OPHTHALMIC EXAM:  Base Eye Exam    Visual Acuity (Snellen - Linear)      Right Left   Dist [place] 20/20 20/70 -2   Dist ph [place] NI NI       Tonometry (Tonopen, 1:45 PM)      Right Left   Pressure 12 13       Pupils      Dark Light Shape React APD   Right 4 3 Round Brisk None   Left 5 5 Round Minimal None       Visual Fields (Counting fingers)      Left Right    Full Full       Extraocular Movement      Right Left    Full, Ortho Full, Ortho       Neuro/Psych    Oriented x3:  Yes   Mood/Affect:  Normal       Dilation    Left eye:  1.0% Mydriacyl, 2.5% Phenylephrine @ 1:45 PM        Slit Lamp and Fundus Exam    Slit Lamp Exam      Right Left   Lids/Lashes Dermatochalasis - upper lid Dermatochalasis - upper lid   Conjunctiva/Sclera White and quiet Temporal Pinguecula, Subconjunctival hemorrhage inferiorly greatest nasally -- improving, sutures intact   Cornea 2+ central Punctate epithelial erosions, Temporal Well healed cataract wounds 2+ diffuse Punctate epithelial erosions, irregular epi inferiorly, Temporal Well healed cataract wounds, Arcus   Anterior Chamber Deep and quiet Deep , 1-2+ Cell   Iris Round and dilated Round and dilated   Lens PC IOL in good position PC IOL in good position, open PC   Vitreous Vitreous syneresis, Posterior vitreous detachment post vitrectomy, single gas bubble at ~40-45%         Fundus Exam      Right Left   Disc  Pink and Sharp   C/D Ratio 0.4 0.3   Macula  Flat, macular cyst improving,    Vessels  Mild Vascular attenuation   Periphery  Attached, confluent mid-zonal drusen          IMAGING AND PROCEDURES  Imaging and Procedures for @TODAY @           ASSESSMENT/PLAN:    ICD-10-CM   1. 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 Macular retinal cyst  of left eye H35.342   3. Epiretinal membrane (ERM) of left eye H35.372   4. Retinal edema H35.81   5. Posterior vitreous detachment of right eye H43.811   6. Pseudophakia of both eyes Z96.1     1-4. 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 - pre-op, pt with decreased BCVA to 20/150 from 20/60 - POW1; s/p 25 gauge PPV/MP/FAX/20% SF6 OS (08.15.19)             - did well this week             - IOP good at 13 today  - gas bubble already over half way reabsorbed             - inc    PF to 6x/day OS                         zymaxid QID OS-- STOP when bottle runs out                         Atropine BID OS-- STOP                         Alphagan BID OS                         PSO ung QID OS             - cont face down positioning as much as possible, avoid laying flat on back             - eye shield when sleeping             - post op drop and positioning instructions reviewed             - tylenol/ibuprofen for pain - F/U 2 weeks, DFE, OCT  5. PVD / vitreous syneresis OD  Discussed findings and prognosis  No RT or RD on 360 scleral depressed exam  Reviewed s/s of RT/RD  Strict return precautions for any such RT/RD signs/symptoms  6. Pseudophakia OU  - s/p CE/IOL OU  - s/p YAG capsulotomy OS 02/08/18  - stable   Ophthalmic Meds Ordered this visit:  Meds ordered this encounter  Medications  . bacitracin-polymyxin b (POLYSPORIN) ophthalmic ointment    Sig: Place into the left eye 4 (four) times daily for 10 days. Place a 1/2 inch ribbon of ointment into the lower  eyelid.    Dispense:  3.5 g    Refill:  3       Return in about 2 weeks (around 03/24/2018) for POV3, DFE, OCT.  There are no Patient Instructions on file for this visit.   Explained the diagnoses, plan, and follow up with the patient and they expressed understanding.  Patient expressed understanding of the importance of proper follow up care.   This document serves as a record of services personally performed by Gardiner Sleeper, MD, PhD. It was created on their behalf by Ernest Mallick, OA, an ophthalmic assistant. The creation of this record is the provider's dictation and/or activities during the visit.    Electronically signed by: Ernest Mallick, OA  08.21.2019 12:08 AM     Gardiner Sleeper, M.D., Ph.D. Diseases & Surgery of the Retina and Vitreous [facility]   I have reviewed the above documentation for accuracy and  completeness, and I agree with the above. Gardiner Sleeper, M.D., Ph.D. 03/12/18 12:08 AM     Abbreviations: M myopia (nearsighted); A astigmatism; H hyperopia (farsighted); P presbyopia; Mrx spectacle prescription;  CTL contact lenses; OD right eye; OS left eye; OU both eyes  XT exotropia; ET esotropia; PEK punctate epithelial keratitis; PEE punctate epithelial erosions; DES dry eye syndrome; MGD meibomian gland dysfunction; ATs artificial tears; PFAT's preservative free artificial tears; [place] nuclear sclerotic cataract; PSC posterior subcapsular cataract; ERM epi-retinal membrane; PVD posterior vitreous detachment; RD retinal detachment; DM diabetes mellitus; DR diabetic retinopathy; NPDR non-proliferative diabetic retinopathy; PDR proliferative diabetic retinopathy; CSME clinically significant macular edema; DME diabetic macular edema; dbh dot blot hemorrhages; CWS cotton wool spot; POAG primary open angle glaucoma; C/D cup-to-disc ratio; HVF humphrey visual field; GVF goldmann visual field; OCT optical coherence tomography; IOP intraocular pressure;  BRVO Branch retinal vein occlusion; CRVO central retinal vein occlusion; CRAO central retinal artery occlusion; BRAO branch retinal artery occlusion; RT retinal tear; SB scleral buckle; PPV pars plana vitrectomy; VH Vitreous hemorrhage; PRP panretinal laser photocoagulation; IVK intravitreal kenalog; VMT vitreomacular traction; MH Macular hole;  NVD neovascularization of the disc; NVE neovascularization elsewhere; AREDS age related eye disease study; ARMD age related macular degeneration; POAG primary open angle glaucoma; EBMD epithelial/anterior basement membrane dystrophy; ACIOL anterior chamber intraocular lens; IOL intraocular lens; PCIOL posterior chamber intraocular lens; Phaco/IOL phacoemulsification with intraocular lens placement; [place] photorefractive keratectomy; LASIK laser assisted in situ keratomileusis; HTN hypertension; DM diabetes mellitus; COPD chronic obstructive pulmonary disease

## 2018-03-10 ENCOUNTER — Ambulatory Visit (INDEPENDENT_AMBULATORY_CARE_PROVIDER_SITE_OTHER): Payer: Medicare Other | Admitting: Ophthalmology

## 2018-03-10 ENCOUNTER — Encounter (INDEPENDENT_AMBULATORY_CARE_PROVIDER_SITE_OTHER): Payer: Self-pay | Admitting: Ophthalmology

## 2018-03-10 DIAGNOSIS — H3581 Retinal edema: Secondary | ICD-10-CM | POA: Diagnosis not present

## 2018-03-10 DIAGNOSIS — H35342 Macular cyst, hole, or pseudohole, left eye: Secondary | ICD-10-CM

## 2018-03-10 DIAGNOSIS — H35372 Puckering of macula, left eye: Secondary | ICD-10-CM | POA: Diagnosis not present

## 2018-03-10 DIAGNOSIS — H43822 Vitreomacular adhesion, left eye: Secondary | ICD-10-CM | POA: Diagnosis not present

## 2018-03-10 DIAGNOSIS — H43811 Vitreous degeneration, right eye: Secondary | ICD-10-CM

## 2018-03-10 DIAGNOSIS — Z961 Presence of intraocular lens: Secondary | ICD-10-CM

## 2018-03-10 MED ORDER — BACITRACIN-POLYMYXIN B 500-10000 UNIT/GM OP OINT: TOPICAL_OINTMENT | Freq: Four times a day (QID) | OPHTHALMIC | 3 refills | Status: AC

## 2018-03-12 ENCOUNTER — Encounter (INDEPENDENT_AMBULATORY_CARE_PROVIDER_SITE_OTHER): Payer: Self-pay | Admitting: Ophthalmology

## 2018-03-21 NOTE — Progress Notes (Addendum)
[facility] Note  03/23/2018     CHIEF COMPLAINT Patient presents for Post-op Follow-up   HISTORY OF PRESENT ILLNESS: Hannah Hooper is a 84 y.o. female who presents to the clinic today for:   HPI    Post-op Follow-up    In left eye.  Discomfort includes none.  Negative for pain, itching, foreign body sensation, tearing, discharge and floaters.  Vision is stable.  I, the attending physician,  performed the HPI with the patient and updated documentation appropriately.          Comments    Pt presents for POV3 for vitreomacular adhesion OS, pt states she feels [facility] is doing great, she states as of last Friday she can no longer see the gas bubble, pt denies FOl, floaters, pain or wavy vision, pt states she is still using gtts as directed       Last edited by Bernarda Caffey, MD on 03/23/2018  9:08 AM. (History)      Referring physician: Kirk Ruths, MD [facility] [place], [place] [address]  HISTORICAL INFORMATION:   Selected notes from the [place] Referred by Dr. Marvel Plan for concern of mac hole OS;  LEE- 06.04.19 ([place]) [BCVA OD: 20/30-1 OS: 20/80-1] Ocular Hx- pseudophakia (2014) PMH- arthritis, HTN, high cholesterol    CURRENT MEDICATIONS: Current Outpatient Medications (Ophthalmic Drugs)  Medication Sig  . brimonidine (ALPHAGAN) 0.2 % ophthalmic solution Place 1 drop into the left eye 2 (two) times daily.  Marland Kitchen ketorolac (ACULAR) 0.5 % ophthalmic solution Place 1 drop into the left eye 4 (four) times daily.  . prednisoLONE acetate (PRED FORTE) 1 % ophthalmic suspension Place 1 drop into the left eye 4 (four) times daily. (Patient not taking: Reported on 02/23/2018)  . Tetrahydrozoline HCl (VISINE OP) Place 1 drop into both eyes daily as needed (for burning).    No current facility-administered medications for this visit.  (Ophthalmic Drugs)   Current Outpatient Medications (Other)  Medication  Sig  . amLODipine (NORVASC) 5 MG tablet Take 5 mg by mouth daily.  Marland Kitchen HYDROcodone-acetaminophen (NORCO) 5-325 MG tablet Take 1 tablet by mouth every 6 (six) hours as needed for moderate pain. (Patient not taking: Reported on 02/23/2018)  . lisinopril-hydrochlorothiazide (PRINZIDE,ZESTORETIC) 20-12.5 MG tablet Take 1 tablet by mouth daily.  . pravastatin (PRAVACHOL) 80 MG tablet Take 80 mg by mouth at bedtime.  . sulfaSALAzine (AZULFIDINE) 500 MG tablet Take 1,000 mg by mouth daily.   No current facility-administered medications for this visit.  (Other)      REVIEW OF SYSTEMS: ROS    Positive for: Musculoskeletal, Endocrine, Cardiovascular, Eyes   Negative for: Constitutional, Gastrointestinal, Neurological, Skin, Genitourinary, HENT, Respiratory, Psychiatric, Allergic/Imm, Heme/Lymph   Last edited by Debbrah Alar, COT on 03/23/2018  8:32 AM. (History)       ALLERGIES No Known Allergies  PAST MEDICAL HISTORY Past Medical History:  Diagnosis Date  . Benign neoplasm of colon, unspecified   . Carotid artery disease ([place])   . Diverticulosis   . Hyperlipemia   . Hypertension   . Hypertensive kidney disease with CKD stage III ([place])   . Lesion of ulnar nerve   . Osteoarthritis    Past Surgical History:  Procedure Laterality Date  . bunions  1990  . CARPOMETACARPAL (CMC) FUSION OF THUMB Left 06/24/2016   Procedure: CARPOMETACARPAL [facility]) ARthroplasty OF THUMB;  Surgeon: Hessie Knows, MD;  Location: [facility] ORS;  Service: Orthopedics;  Laterality: Left;  . CATARACT EXTRACTION    . EYE SURGERY Bilateral 2014   cataract surgery  . GAS/FLUID EXCHANGE Left 03/02/2018   Procedure: GAS/FLUID EXCHANGE;  Surgeon: Bernarda Caffey, MD;  Location: [place];  Service: Ophthalmology;  Laterality: Left;  . MEMBRANE PEEL Left 03/02/2018   Procedure: MEMBRANE PEEL;  Surgeon: Bernarda Caffey, MD;  Location: [place];  Service: Ophthalmology;  Laterality: Left;  . PARS PLANA VITRECTOMY Left 03/02/2018   Procedure:  PARS PLANA VITRECTOMY WITH 25 GAUGE;  Surgeon: Bernarda Caffey, MD;  Location: [place];  Service: Ophthalmology;  Laterality: Left;  . SHOULDER ARTHROSCOPY Right 2014  . TONSILLECTOMY     as a child  . ULNAR NERVE TRANSPOSITION Right     FAMILY HISTORY Family History  Problem Relation Age of Onset  . Breast cancer Mother 31    SOCIAL HISTORY Social History   Tobacco Use  . Smoking status: Never Smoker  . Smokeless tobacco: Never Used  Substance Use Topics  . Alcohol use: Yes    Comment: occasionally mixed drink  . Drug use: No         OPHTHALMIC EXAM:  Base Eye Exam    Visual Acuity (Snellen - Linear)      Right Left   Dist [place] 20/20 20/40 -1   Dist ph [place]  NI       Tonometry (Tonopen, 8:37 AM)      Right Left   Pressure 14 14       Pupils      Dark Light Shape React APD   Right 3 2 Round Slow None   Left 3 2 Round Slow None       Visual Fields (Counting fingers)      Left Right    Full Full       Extraocular Movement      Right Left    Full, Ortho Full, Ortho       Neuro/Psych    Oriented x3:  Yes   Mood/Affect:  Normal       Dilation    Left eye:  1.0% Mydriacyl @ 8:36 AM        Slit Lamp and Fundus Exam    Slit Lamp Exam      Right Left   Lids/Lashes Dermatochalasis - upper lid Dermatochalasis - upper lid   Conjunctiva/Sclera White and quiet Temporal Pinguecula, sutures intact   Cornea 2+ central Punctate epithelial erosions, Temporal Well healed cataract wounds 1+ diffuse Punctate epithelial erosions, irregular epi inferiorly, Temporal Well healed cataract wounds, Arcus   Anterior Chamber Deep and quiet Deep , 1-2+ Cell/pigment   Iris Round and dilated Round and dilated   Lens PC IOL in good position PC IOL in good position, open PC   Vitreous Vitreous syneresis, Posterior vitreous detachment post vitrectomy, mild pigment, gas bubble gone        Fundus Exam      Right Left   Disc  Pink and Sharp, Tilted disc   C/D Ratio 0.4 0.4    Macula  Blunted foveal reflex, mild cystic changes, no ERM   Vessels  Mild Vascular attenuation   Periphery  Attached, confluent mid-zonal drusen          IMAGING AND PROCEDURES  Imaging and Procedures for @TODAY @  OCT, Retina - OU - Both Eyes       Right Eye Quality was good. Central Foveal Thickness: 234. Progression has been stable. Findings include normal  foveal contour, no IRF, no SRF (PVD).   Left Eye Quality was good. Central Foveal Thickness: 447. Progression has improved. Findings include intraretinal fluid, abnormal foveal contour, subretinal fluid.   Notes *Images captured and stored on drive  Diagnosis / Impression:  OD: NFP, No IRF/SRF  OS: VMT resolved, mild CME/IRF vs foveoschisis   Clinical management:  See below  Abbreviations: NFP - Normal foveal profile. CME - cystoid macular edema. PED - pigment epithelial detachment. IRF - intraretinal fluid. SRF - subretinal fluid. EZ - ellipsoid zone. ERM - epiretinal membrane. ORA - outer retinal atrophy. ORT - outer retinal tubulation. SRHM - subretinal hyper-reflective material                  ASSESSMENT/PLAN:    ICD-10-CM   1. 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 Macular retinal cyst of left eye H35.342   3. Epiretinal membrane (ERM) of left eye H35.372   4. Retinal edema H35.81 OCT, Retina - OU - Both Eyes  5. Posterior vitreous detachment of right eye H43.811   6. Pseudophakia of both eyes Z96.1     1-4. 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 And pt pleased w/ vision OS             - IOP good at 14 today  - gas bubble gone -- okay to remove green gas bracelet  - OCT shows ERM gone, but mild residual CME/IRF vs foveoschisis             - cont   PF to 6x/day OS                         Alphagan BID  OS                         PSO ung QID OS  - start Ketorolac QID OS             - post op drop instructions reviewed  - can DC positioning - F/U 4 weeks, DFE, OCT  5. PVD / vitreous syneresis OD  Discussed findings and prognosis  No RT or RD on 360 scleral depressed exam  Reviewed s/s of RT/RD  Strict return precautions for any such RT/RD sings/symptoms  6. Pseudophakia OU  - s/p CE/IOL OU  - s/p YAG capsulotomy OS 02/08/18  - stable   Ophthalmic Meds Ordered this visit:  Meds ordered this encounter  Medications  . brimonidine (ALPHAGAN) 0.2 % ophthalmic solution    Sig: Place 1 drop into the left eye 2 (two) times daily.    Dispense:  10 mL    Refill:  1  . ketorolac (ACULAR) 0.5 % ophthalmic solution    Sig: Place 1 drop into the left eye 4 (four) times daily.    Dispense:  10 mL    Refill:  1       Return in about 4 weeks (around 04/20/2018) for POV, DFE, OCT.  There are no Patient Instructions on file for this visit.   Explained the diagnoses, plan, and follow up with the patient and they expressed understanding.  Patient expressed understanding of the importance of  proper follow up care.   This document serves as a record of services personally performed by Gardiner Sleeper, MD, PhD. It was created on their behalf by Catha Brow, [place], a certified ophthalmic assistant. The creation of this record is the provider's dictation and/or activities during the visit.  Electronically signed by: Catha Brow, COA  09.03.19 11:45 PM    Gardiner Sleeper, M.D., Ph.D. Diseases & Surgery of the Retina and [place]   I have reviewed the above documentation for accuracy and completeness, and I agree with the above. Gardiner Sleeper, M.D., Ph.D. 03/23/18 11:45 PM     Abbreviations: M myopia (nearsighted); A astigmatism; H hyperopia (farsighted); P presbyopia; Mrx spectacle prescription;  CTL contact lenses; OD right eye; OS left eye;  OU both eyes  XT exotropia; ET esotropia; PEK punctate epithelial keratitis; PEE punctate epithelial erosions; DES dry eye syndrome; MGD meibomian gland dysfunction; ATs artificial tears; PFAT's preservative free artificial tears; [place] nuclear sclerotic cataract; PSC posterior subcapsular cataract; ERM epi-retinal membrane; PVD posterior vitreous detachment; RD retinal detachment; DM diabetes mellitus; DR diabetic retinopathy; NPDR non-proliferative diabetic retinopathy; PDR proliferative diabetic retinopathy; CSME clinically significant macular edema; DME diabetic macular edema; dbh dot blot hemorrhages; CWS cotton wool spot; POAG primary open angle glaucoma; C/D cup-to-disc ratio; HVF humphrey visual field; GVF goldmann visual field; OCT optical coherence tomography; IOP intraocular pressure; BRVO Branch retinal vein occlusion; CRVO central retinal vein occlusion; CRAO central retinal artery occlusion; BRAO branch retinal artery occlusion; RT retinal tear; SB scleral buckle; PPV pars plana vitrectomy; VH Vitreous hemorrhage; PRP panretinal laser photocoagulation; IVK intravitreal kenalog; VMT vitreomacular traction; MH Macular hole;  NVD neovascularization of the disc; NVE neovascularization elsewhere; AREDS age related eye disease study; ARMD age related macular degeneration; POAG primary open angle glaucoma; EBMD epithelial/anterior basement membrane dystrophy; ACIOL anterior chamber intraocular lens; IOL intraocular lens; PCIOL posterior chamber intraocular lens; Phaco/IOL phacoemulsification with intraocular lens placement; [place] photorefractive keratectomy; LASIK laser assisted in situ keratomileusis; HTN hypertension; DM diabetes mellitus; COPD chronic obstructive pulmonary disease

## 2018-03-23 ENCOUNTER — Ambulatory Visit (INDEPENDENT_AMBULATORY_CARE_PROVIDER_SITE_OTHER): Payer: Medicare Other | Admitting: Ophthalmology

## 2018-03-23 ENCOUNTER — Encounter (INDEPENDENT_AMBULATORY_CARE_PROVIDER_SITE_OTHER): Payer: Self-pay | Admitting: Ophthalmology

## 2018-03-23 DIAGNOSIS — H43811 Vitreous degeneration, right eye: Secondary | ICD-10-CM

## 2018-03-23 DIAGNOSIS — H35342 Macular cyst, hole, or pseudohole, left eye: Secondary | ICD-10-CM | POA: Diagnosis not present

## 2018-03-23 DIAGNOSIS — H43822 Vitreomacular adhesion, left eye: Secondary | ICD-10-CM | POA: Diagnosis not present

## 2018-03-23 DIAGNOSIS — H3581 Retinal edema: Secondary | ICD-10-CM | POA: Diagnosis not present

## 2018-03-23 DIAGNOSIS — H35372 Puckering of macula, left eye: Secondary | ICD-10-CM

## 2018-03-23 DIAGNOSIS — Z961 Presence of intraocular lens: Secondary | ICD-10-CM

## 2018-03-23 MED ORDER — KETOROLAC TROMETHAMINE 0.5 % OP SOLN: 1.0000 [drp] | Freq: Four times a day (QID) | OPHTHALMIC | 1 refills | Status: DC

## 2018-03-23 MED ORDER — BRIMONIDINE TARTRATE 0.2 % OP SOLN: 1.0000 [drp] | Freq: Two times a day (BID) | OPHTHALMIC | 1 refills | Status: AC

## 2018-04-19 NOTE — Progress Notes (Signed)
[facility] Note  04/20/2018     CHIEF COMPLAINT Patient presents for Post-op Follow-up   HISTORY OF PRESENT ILLNESS: Hannah Hooper is a 84 y.o. female who presents to the clinic today for:   HPI    Post-op Follow-up    In left eye.  Discomfort includes none.  Negative for pain, itching, foreign body sensation, tearing, discharge and floaters.  Vision is stable.  I, the attending physician,  performed the HPI with the patient and updated documentation appropriately.          Comments    POW7; S/P 25g PPV/MP/FAX/20% SF6 OS (08.15.19); pt states she is having no problems with her vision, she states she is using the gtt as directed, she denies flashes, floaters, pain or wavy vision       Last edited by Bernarda Caffey, MD on 04/20/2018  9:23 AM. (History)      Referring physician: Kirk Ruths, MD [place] [place], [place] [address]  HISTORICAL INFORMATION:   Selected notes from the MEDICAL RECORD NUMBER Referred by Dr. Marvel Plan for concern of mac hole OS;  LEE- 06.04.19 ([place]) [BCVA OD: 20/30-1 OS: 20/80-1] Ocular Hx- pseudophakia (2014) PMH- arthritis, HTN, high cholesterol    CURRENT MEDICATIONS: Current Outpatient Medications (Ophthalmic Drugs)  Medication Sig  . brimonidine (ALPHAGAN) 0.2 % ophthalmic solution Place 1 drop into the left eye 2 (two) times daily.  Marland Kitchen ketorolac (ACULAR) 0.5 % ophthalmic solution Place 1 drop into the left eye 4 (four) times daily.  . prednisoLONE acetate (PRED FORTE) 1 % ophthalmic suspension Place 1 drop into the left eye 4 (four) times daily. (Patient not taking: Reported on 02/23/2018)  . Tetrahydrozoline HCl (VISINE OP) Place 1 drop into both eyes daily as needed (for burning).    No current facility-administered medications for this visit.  (Ophthalmic Drugs)   Current Outpatient Medications (Other)  Medication Sig  . amLODipine (NORVASC) 5 MG tablet Take 5 mg  by mouth daily.  Marland Kitchen HYDROcodone-acetaminophen (NORCO) 5-325 MG tablet Take 1 tablet by mouth every 6 (six) hours as needed for moderate pain. (Patient not taking: Reported on 02/23/2018)  . lisinopril-hydrochlorothiazide (PRINZIDE,ZESTORETIC) 20-12.5 MG tablet Take 1 tablet by mouth daily.  . pravastatin (PRAVACHOL) 80 MG tablet Take 80 mg by mouth at bedtime.  . sulfaSALAzine (AZULFIDINE) 500 MG tablet Take 1,000 mg by mouth daily.   No current facility-administered medications for this visit.  (Other)      REVIEW OF SYSTEMS: ROS    Positive for: Musculoskeletal, Endocrine, Cardiovascular, Eyes   Negative for: Constitutional, Gastrointestinal, Neurological, Skin, Genitourinary, HENT, Respiratory, Psychiatric, Allergic/Imm, Heme/Lymph   Last edited by Debbrah Alar, COT on 04/20/2018  9:01 AM. (History)       ALLERGIES No Known Allergies  PAST MEDICAL HISTORY Past Medical History:  Diagnosis Date  . Benign neoplasm of colon, unspecified   . Carotid artery disease ([place])   . Diverticulosis   . Hyperlipemia   . Hypertension   . Hypertensive kidney disease with CKD stage III ([place])   . Lesion of ulnar nerve   . Osteoarthritis    Past Surgical History:  Procedure Laterality Date  . bunions  1990  . CARPOMETACARPAL (CMC) FUSION OF THUMB Left 06/24/2016   Procedure: CARPOMETACARPAL [facility]) ARthroplasty OF THUMB;  Surgeon: Hessie Knows, MD;  Location: [facility] ORS;  Service: Orthopedics;  Laterality: Left;  . CATARACT EXTRACTION    .  EYE SURGERY Bilateral 2014   cataract surgery  . GAS/FLUID EXCHANGE Left 03/02/2018   Procedure: GAS/FLUID EXCHANGE;  Surgeon: Bernarda Caffey, MD;  Location: [place];  Service: Ophthalmology;  Laterality: Left;  . MEMBRANE PEEL Left 03/02/2018   Procedure: MEMBRANE PEEL;  Surgeon: Bernarda Caffey, MD;  Location: [place];  Service: Ophthalmology;  Laterality: Left;  . PARS PLANA VITRECTOMY Left 03/02/2018   Procedure: PARS PLANA VITRECTOMY WITH 25 GAUGE;  Surgeon:  Bernarda Caffey, MD;  Location: [place];  Service: Ophthalmology;  Laterality: Left;  . SHOULDER ARTHROSCOPY Right 2014  . TONSILLECTOMY     as a child  . ULNAR NERVE TRANSPOSITION Right     FAMILY HISTORY Family History  Problem Relation Age of Onset  . Breast cancer Mother 46    SOCIAL HISTORY Social History   Tobacco Use  . Smoking status: Never Smoker  . Smokeless tobacco: Never Used  Substance Use Topics  . Alcohol use: Yes    Comment: occasionally mixed drink  . Drug use: No         OPHTHALMIC EXAM:  Base Eye Exam    Visual Acuity (Snellen - Linear)      Right Left   Dist [place] 20/25 -1 20/40 -1   Dist ph [place] 20/20 -2 NI       Tonometry (Tonopen, 9:06 AM)      Right Left   Pressure 10 12       Pupils      Dark Light Shape React APD   Right 3 2 Round Slow None   Left 3 2 Round Slow None       Visual Fields (Counting fingers)      Left Right    Full Full       Extraocular Movement      Right Left    Full, Ortho Full, Ortho       Neuro/Psych    Oriented x3:  Yes   Mood/Affect:  Normal       Dilation    Both eyes:  1.0% Mydriacyl, 2.5% Phenylephrine @ 9:06 AM        Slit Lamp and Fundus Exam    Slit Lamp Exam      Right Left   Lids/Lashes Dermatochalasis - upper lid Dermatochalasis - upper lid   Conjunctiva/Sclera White and quiet Temporal Pinguecula, sutures desolved   Cornea 2+ central Punctate epithelial erosions, Temporal Well healed cataract wounds 1-2+ diffuse Punctate epithelial erosions, Temporal Well healed cataract wounds, Arcus   Anterior Chamber Deep and quiet Deep , 1+ Cell/pigment   Iris Round and dilated Round and dilated   Lens PC IOL in good position PC IOL in good position, open PC   Vitreous Vitreous syneresis, Posterior vitreous detachment post vitrectomy, mild pigment       Fundus Exam      Right Left   Disc  Pink and Sharp, Tilted disc   C/D Ratio 0.4 0.4   Macula  Blunted foveal reflex, persistent cystic changes --  stable, no ERM   Vessels  Mild Vascular attenuation   Periphery  Attached, confluent mid-zonal drusen          IMAGING AND PROCEDURES  Imaging and Procedures for _0 @  OCT, Retina - OU - Both Eyes       Right Eye Quality was good. Central Foveal Thickness: 236. Progression has been stable. Findings include normal foveal contour, no IRF, no SRF (PVD).   Left Eye Quality was borderline.  Central Foveal Thickness: 439. Progression has improved. Findings include intraretinal fluid, abnormal foveal contour, no SRF.   Notes *Images captured and stored on drive  Diagnosis / Impression:  OD: NFP, No IRF/SRF  OS: VMT resolved, mild CME/IRF vs foveoschisis -- mild improvement   Clinical management:  See below  Abbreviations: NFP - Normal foveal profile. CME - cystoid macular edema. PED - pigment epithelial detachment. IRF - intraretinal fluid. SRF - subretinal fluid. EZ - ellipsoid zone. ERM - epiretinal membrane. ORA - outer retinal atrophy. ORT - outer retinal tubulation. SRHM - subretinal hyper-reflective material                  ASSESSMENT/PLAN:    ICD-10-CM   1. 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 Macular retinal cyst of left eye H35.342   3. Epiretinal membrane (ERM) of left eye H35.372   4. Retinal edema H35.81 OCT, Retina - OU - Both Eyes  5. Posterior vitreous detachment of right eye H43.811   6. Pseudophakia of both eyes Z96.1   7. PCO (posterior capsular opacification), left H26.492     1-4. 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 And pt pleased w/ vision OS             - IOP good at 12 today  - OCT shows ERM gone, but mild residual CME/IRF vs foveoschisis             - cont   PF to 6x/day OS                         Alphagan QD OS                          PSO ung QID OS   Ketorolac QID OS             - post op drop instructions reviewed  - can DC positioning - F/U 4 weeks, DFE, OCT  5. PVD / vitreous syneresis OD  Discussed findings and prognosis  No RT or RD on 360 scleral depressed exam  Reviewed s/s of RT/RD  Strict return precautions for any such RT/RD sings/symptoms  6. Pseudophakia OU  - s/p CE/IOL OU  - s/p YAG capsulotomy OS 02/08/18  - stable   Ophthalmic Meds Ordered this visit:  No orders of the defined types were placed in this encounter.      Return in about 4 weeks (around 05/18/2018) for POV; POW 11.  There are no Patient Instructions on file for this visit.   Explained the diagnoses, plan, and follow up with the patient and they expressed understanding.  Patient expressed understanding of the importance of proper follow up care.   This document serves as a record of services personally performed by Gardiner Sleeper, MD, PhD. It was created on their behalf by Ernest Mallick, OA, an ophthalmic assistant. The creation of this record is the provider's dictation and/or activities during the visit.    Electronically signed by: Ernest Mallick, OA  10.02.19 12:22 PM     Gardiner Sleeper, M.D., Ph.D. Diseases & Surgery of the Retina and Vitreous [facility]  [place]   I have reviewed the above documentation for accuracy and completeness, and I agree with the above. Gardiner Sleeper, M.D., Ph.D. 04/21/18 12:23 PM    Abbreviations: M myopia (nearsighted); A astigmatism; H hyperopia (farsighted); P presbyopia; Mrx spectacle prescription;  CTL contact lenses; OD right eye; OS left eye; OU both eyes  XT exotropia; ET esotropia; PEK punctate epithelial keratitis; PEE punctate epithelial erosions; DES dry eye syndrome; MGD meibomian gland dysfunction; ATs artificial tears; PFAT's preservative free artificial tears; [place] nuclear sclerotic cataract; PSC posterior subcapsular cataract; ERM epi-retinal membrane; PVD  posterior vitreous detachment; RD retinal detachment; DM diabetes mellitus; DR diabetic retinopathy; NPDR non-proliferative diabetic retinopathy; PDR proliferative diabetic retinopathy; CSME clinically significant macular edema; DME diabetic macular edema; dbh dot blot hemorrhages; CWS cotton wool spot; POAG primary open angle glaucoma; C/D cup-to-disc ratio; HVF humphrey visual field; GVF goldmann visual field; OCT optical coherence tomography; IOP intraocular pressure; BRVO Branch retinal vein occlusion; CRVO central retinal vein occlusion; CRAO central retinal artery occlusion; BRAO branch retinal artery occlusion; RT retinal tear; SB scleral buckle; PPV pars plana vitrectomy; VH Vitreous hemorrhage; PRP panretinal laser photocoagulation; IVK intravitreal kenalog; VMT vitreomacular traction; MH Macular hole;  NVD neovascularization of the disc; NVE neovascularization elsewhere; AREDS age related eye disease study; ARMD age related macular degeneration; POAG primary open angle glaucoma; EBMD epithelial/anterior basement membrane dystrophy; ACIOL anterior chamber intraocular lens; IOL intraocular lens; PCIOL posterior chamber intraocular lens; Phaco/IOL phacoemulsification with intraocular lens placement; [place] photorefractive keratectomy; LASIK laser assisted in situ keratomileusis; HTN hypertension; DM diabetes mellitus; COPD chronic obstructive pulmonary disease

## 2018-04-20 ENCOUNTER — Encounter (INDEPENDENT_AMBULATORY_CARE_PROVIDER_SITE_OTHER): Payer: Self-pay | Admitting: Ophthalmology

## 2018-04-20 ENCOUNTER — Ambulatory Visit (INDEPENDENT_AMBULATORY_CARE_PROVIDER_SITE_OTHER): Payer: Medicare Other | Admitting: Ophthalmology

## 2018-04-20 DIAGNOSIS — H43811 Vitreous degeneration, right eye: Secondary | ICD-10-CM

## 2018-04-20 DIAGNOSIS — H43822 Vitreomacular adhesion, left eye: Secondary | ICD-10-CM | POA: Diagnosis not present

## 2018-04-20 DIAGNOSIS — Z961 Presence of intraocular lens: Secondary | ICD-10-CM

## 2018-04-20 DIAGNOSIS — H3581 Retinal edema: Secondary | ICD-10-CM | POA: Diagnosis not present

## 2018-04-20 DIAGNOSIS — H35372 Puckering of macula, left eye: Secondary | ICD-10-CM | POA: Diagnosis not present

## 2018-04-20 DIAGNOSIS — H35342 Macular cyst, hole, or pseudohole, left eye: Secondary | ICD-10-CM

## 2018-04-20 DIAGNOSIS — H26492 Other secondary cataract, left eye: Secondary | ICD-10-CM

## 2018-04-21 ENCOUNTER — Encounter (INDEPENDENT_AMBULATORY_CARE_PROVIDER_SITE_OTHER): Payer: Self-pay | Admitting: Ophthalmology

## 2018-05-16 NOTE — Progress Notes (Signed)
[facility] Note  05/18/2018     CHIEF COMPLAINT Patient presents for Post-op Follow-up   HISTORY OF PRESENT ILLNESS: Hannah Hooper is a 84 y.o. female who presents to the clinic today for:   HPI    Post-op Follow-up    In left eye.  Discomfort includes Negative for pain, itching, foreign body sensation, tearing, discharge, floaters and none.  Vision is stable.  I, the attending physician,  performed the HPI with the patient and updated documentation appropriately.          Comments    4 week s/p PPV/MP/FAX/SF6 OS (03/02/2018)- patient states vision seems about the same as last visit. No eye pain/discomfort. Almost finished with brimonidine and prednisolone gtts.       Last edited by Bernarda Caffey, MD on 05/18/2018 10:39 AM. (History)    pt states that she noticed this morning when doing the eye test that she can see a little better than last visit  Referring physician: Kirk Ruths, MD [address], [place] [address]  HISTORICAL INFORMATION:   Selected notes from the [facility] Referred by Dr. Marvel Plan for concern of mac hole OS;  LEE- 06.04.19 ([place]) [BCVA OD: 20/30-1 OS: 20/80-1] Ocular Hx- pseudophakia (2014) PMH- arthritis, HTN, high cholesterol    CURRENT MEDICATIONS: Current Outpatient Medications (Ophthalmic Drugs)  Medication Sig  . brimonidine (ALPHAGAN) 0.2 % ophthalmic solution Place 1 drop into the left eye 2 (two) times daily.  . prednisoLONE acetate (PRED FORTE) 1 % ophthalmic suspension Place 1 drop into the left eye 4 (four) times daily.  . Tetrahydrozoline HCl (VISINE OP) Place 1 drop into both eyes daily as needed (for burning).   Marland Kitchen ketorolac (ACULAR) 0.5 % ophthalmic solution Place 1 drop into the left eye 4 (four) times daily.   No current facility-administered medications for this visit.  (Ophthalmic Drugs)   Current Outpatient Medications (Other)   Medication Sig  . amLODipine (NORVASC) 5 MG tablet Take 5 mg by mouth daily.  Marland Kitchen HYDROcodone-acetaminophen (NORCO) 5-325 MG tablet Take 1 tablet by mouth every 6 (six) hours as needed for moderate pain.  Marland Kitchen lisinopril-hydrochlorothiazide (PRINZIDE,ZESTORETIC) 20-12.5 MG tablet Take 1 tablet by mouth daily.  . pravastatin (PRAVACHOL) 80 MG tablet Take 80 mg by mouth at bedtime.  . sulfaSALAzine (AZULFIDINE) 500 MG tablet Take 1,000 mg by mouth daily.   No current facility-administered medications for this visit.  (Other)      REVIEW OF SYSTEMS: ROS    Positive for: Musculoskeletal, Endocrine, Cardiovascular, Eyes   Negative for: Constitutional, Gastrointestinal, Neurological, Skin, Genitourinary, HENT, Respiratory, Psychiatric, Allergic/Imm, Heme/Lymph   Last edited by Roselee Nova D on 05/18/2018  8:57 AM. (History)       ALLERGIES No Known Allergies  PAST MEDICAL HISTORY Past Medical History:  Diagnosis Date  . Benign neoplasm of colon, unspecified   . Carotid artery disease ([place])   . Diverticulosis   . Hyperlipemia   . Hypertension   . Hypertensive kidney disease with CKD stage III ([place])   . Lesion of ulnar nerve   . Osteoarthritis    Past Surgical History:  Procedure Laterality Date  . bunions  1990  . CARPOMETACARPAL (CMC) FUSION OF THUMB Left 06/24/2016   Procedure: CARPOMETACARPAL [facility]) ARthroplasty OF THUMB;  Surgeon: Hessie Knows, MD;  Location: [facility] ORS;  Service: Orthopedics;  Laterality: Left;  . CATARACT EXTRACTION    . EYE SURGERY  Bilateral 2014   cataract surgery  . GAS/FLUID EXCHANGE Left 03/02/2018   Procedure: GAS/FLUID EXCHANGE;  Surgeon: Bernarda Caffey, MD;  Location: [facility];  Service: Ophthalmology;  Laterality: Left;  . MEMBRANE PEEL Left 03/02/2018   Procedure: MEMBRANE PEEL;  Surgeon: Bernarda Caffey, MD;  Location: [facility];  Service: Ophthalmology;  Laterality: Left;  . PARS PLANA VITRECTOMY Left 03/02/2018   Procedure: PARS PLANA VITRECTOMY WITH 25  GAUGE;  Surgeon: Bernarda Caffey, MD;  Location: [facility];  Service: Ophthalmology;  Laterality: Left;  . SHOULDER ARTHROSCOPY Right 2014  . TONSILLECTOMY     as a child  . ULNAR NERVE TRANSPOSITION Right     FAMILY HISTORY Family History  Problem Relation Age of Onset  . Breast cancer Mother 72    SOCIAL HISTORY Social History   Tobacco Use  . Smoking status: Never Smoker  . Smokeless tobacco: Never Used  Substance Use Topics  . Alcohol use: Yes    Comment: occasionally mixed drink  . Drug use: No         OPHTHALMIC EXAM:  Base Eye Exam    Visual Acuity (Snellen - Linear)      Right Left   Dist [place] 20/25 -2 20/40   Dist ph [place] 20/20 -1 20/30       Tonometry (Tonopen, 9:11 AM)      Right Left   Pressure 12 10       Pupils      Dark Light Shape React APD   Right 3 2 Round Slow None   Left 3 2 Round Slow None       Visual Fields (Counting fingers)      Left Right    Full Full       Extraocular Movement      Right Left    Full, Ortho Full, Ortho       Neuro/Psych    Oriented x3:  Yes   Mood/Affect:  Normal       Dilation    Both eyes:  1.0% Mydriacyl, 2.5% Phenylephrine @ 9:11 AM        Slit Lamp and Fundus Exam    Slit Lamp Exam      Right Left   Lids/Lashes Dermatochalasis - upper lid Dermatochalasis - upper lid   Conjunctiva/Sclera White and quiet Temporal Pinguecula, sutures desolved   Cornea 2+ inferior Punctate epithelial erosions, Temporal Well healed cataract wounds 2+ inferior Punctate epithelial erosions, Temporal Well healed cataract wounds, Arcus   Anterior Chamber Deep and quiet Deep , 1/2+ Cell/pigment   Iris Round and dilated Round and dilated   Lens PC IOL in good position PC IOL in good position, open PC   Vitreous Vitreous syneresis, Posterior vitreous detachment post vitrectomy, mild pigment       Fundus Exam      Right Left   Disc  Pink and Sharp, Tilted disc   C/D Ratio 0.4 0.4   Macula  Blunted foveal reflex, persistent  cystic changes -- mildly improved, no ERM   Vessels  Mild Vascular attenuation   Periphery  Attached, confluent mid-zonal drusen        Refraction    Manifest Refraction      Sphere Cylinder Axis Dist VA   Right -1.00 +1.25 030 20/20-1   Left -0.50 +1.00 160 20/25-1          IMAGING AND PROCEDURES  Imaging and Procedures for @TODAY @  OCT, Retina - OU - Both Eyes  Right Eye Quality was good. Central Foveal Thickness: 236. Progression has been stable. Findings include normal foveal contour, no IRF, no SRF (PVD).   Left Eye Quality was borderline. Central Foveal Thickness: 427. Progression has been stable. Findings include intraretinal fluid, abnormal foveal contour, no SRF (Mild interval improvement in IRF).   Notes *Images captured and stored on drive  Diagnosis / Impression:  OD: NFP, No IRF/SRF  OS: VMT resolved, mild CME/IRF vs foveoschisis -- mild improvement   Clinical management:  See below  Abbreviations: NFP - Normal foveal profile. CME - cystoid macular edema. PED - pigment epithelial detachment. IRF - intraretinal fluid. SRF - subretinal fluid. EZ - ellipsoid zone. ERM - epiretinal membrane. ORA - outer retinal atrophy. ORT - outer retinal tubulation. 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 Macular retinal cyst of left eye H35.342   3. Epiretinal membrane (ERM) of left eye H35.372   4. Retinal edema H35.81 OCT, Retina - OU - Both Eyes  5. Posterior vitreous detachment of right eye H43.811   6. Pseudophakia of both eyes Z96.1   7. PCO (posterior capsular opacification), left H26.492     1-4. 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 PVD / vitreous syneresis OD  Discussed findings and prognosis  No RT or RD on 360 scleral depressed exam  Reviewed s/s of RT/RD  Strict return precautions for any such RT/RD sings/symptoms  6. Pseudophakia OU  - s/p CE/IOL OU  - s/p YAG capsulotomy OS 02/08/18  - stable   Ophthalmic Meds Ordered this visit:  Meds ordered this encounter  Medications  . prednisoLONE acetate (PRED FORTE) 1 % ophthalmic suspension    Sig: Place 1 drop into the left eye 4 (four) times daily.    Dispense:  10 mL    Refill:  1  . ketorolac (ACULAR) 0.5 % ophthalmic solution    Sig: Place 1 drop into the left eye 4 (four) times daily.    Dispense:  10 mL    Refill:  1       Return in about 4 weeks (around 06/15/2018) for F/U VMT, DFE, OCT.  There are no Patient Instructions on file for this visit.   Explained the diagnoses, plan, and follow up with the patient and they expressed understanding.  Patient expressed understanding of the importance of proper  follow up care.   This document serves as a record of services personally performed by Gardiner Sleeper, MD, PhD. It was created on their behalf by Ernest Mallick, OA, an ophthalmic assistant. The creation of this record is the provider's dictation and/or activities during the visit.    Electronically signed by: Ernest Mallick, OA  10.29.19 1:14 AM     Gardiner Sleeper, M.D., Ph.D. Diseases & Surgery of the Retina and Vitreous [facility]   I have reviewed the above documentation for accuracy and completeness, and I agree with the above. Gardiner Sleeper, M.D., Ph.D. 05/20/18 1:14 AM     Abbreviations: M  myopia (nearsighted); A astigmatism; H hyperopia (farsighted); P presbyopia; Mrx spectacle prescription;  CTL contact lenses; OD right eye; OS left eye; OU both eyes  XT exotropia; ET esotropia; PEK punctate epithelial keratitis; PEE punctate epithelial erosions; DES dry eye syndrome; MGD meibomian gland dysfunction; ATs artificial tears; PFAT's preservative free artificial tears; [place] nuclear sclerotic cataract; PSC posterior subcapsular cataract; ERM epi-retinal membrane; PVD posterior vitreous detachment; RD retinal detachment; DM diabetes mellitus; DR diabetic retinopathy; NPDR non-proliferative diabetic retinopathy; PDR proliferative diabetic retinopathy; CSME clinically significant macular edema; DME diabetic macular edema; dbh dot blot hemorrhages; CWS cotton wool spot; POAG primary open angle glaucoma; C/D cup-to-disc ratio; HVF humphrey visual field; GVF goldmann visual field; OCT optical coherence tomography; IOP intraocular pressure; BRVO Branch retinal vein occlusion; CRVO central retinal vein occlusion; CRAO central retinal artery occlusion; BRAO branch retinal artery occlusion; RT retinal tear; SB scleral buckle; PPV pars plana vitrectomy; VH Vitreous hemorrhage; PRP panretinal laser photocoagulation; IVK intravitreal kenalog; VMT vitreomacular traction; MH Macular hole;  NVD neovascularization of the disc; NVE neovascularization elsewhere; AREDS age related eye disease study; ARMD age related macular degeneration; POAG primary open angle glaucoma; EBMD epithelial/anterior basement membrane dystrophy; ACIOL anterior chamber intraocular lens; IOL intraocular lens; PCIOL posterior chamber intraocular lens; Phaco/IOL phacoemulsification with intraocular lens placement; [place] photorefractive keratectomy; LASIK laser assisted in situ keratomileusis; HTN hypertension; DM diabetes mellitus; COPD chronic obstructive pulmonary disease

## 2018-05-18 ENCOUNTER — Ambulatory Visit (INDEPENDENT_AMBULATORY_CARE_PROVIDER_SITE_OTHER): Payer: Medicare Other | Admitting: Ophthalmology

## 2018-05-18 ENCOUNTER — Encounter (INDEPENDENT_AMBULATORY_CARE_PROVIDER_SITE_OTHER): Payer: Self-pay | Admitting: Ophthalmology

## 2018-05-18 DIAGNOSIS — Z961 Presence of intraocular lens: Secondary | ICD-10-CM

## 2018-05-18 DIAGNOSIS — H35372 Puckering of macula, left eye: Secondary | ICD-10-CM | POA: Diagnosis not present

## 2018-05-18 DIAGNOSIS — H43822 Vitreomacular adhesion, left eye: Secondary | ICD-10-CM | POA: Diagnosis not present

## 2018-05-18 DIAGNOSIS — H3581 Retinal edema: Secondary | ICD-10-CM | POA: Diagnosis not present

## 2018-05-18 DIAGNOSIS — H43811 Vitreous degeneration, right eye: Secondary | ICD-10-CM

## 2018-05-18 DIAGNOSIS — H26492 Other secondary cataract, left eye: Secondary | ICD-10-CM

## 2018-05-18 DIAGNOSIS — H35342 Macular cyst, hole, or pseudohole, left eye: Secondary | ICD-10-CM

## 2018-05-18 MED ORDER — KETOROLAC TROMETHAMINE 0.5 % OP SOLN: 1.0000 [drp] | Freq: Four times a day (QID) | OPHTHALMIC | 1 refills | Status: AC

## 2018-05-18 MED ORDER — PREDNISOLONE ACETATE 1 % OP SUSP: 1.0000 [drp] | Freq: Four times a day (QID) | OPHTHALMIC | 1 refills | Status: DC

## 2018-05-20 ENCOUNTER — Encounter (INDEPENDENT_AMBULATORY_CARE_PROVIDER_SITE_OTHER): Payer: Self-pay | Admitting: Ophthalmology

## 2018-06-12 NOTE — Progress Notes (Signed)
[facility] Note  06/14/2018     CHIEF COMPLAINT Patient presents for Post-op Follow-up   HISTORY OF PRESENT ILLNESS: Hannah Hooper is a 84 y.o. female who presents to the clinic today for:   HPI    Post-op Follow-up    In left eye.  Discomfort includes none.  Vision is improved.  I, the attending physician,  performed the HPI with the patient and updated documentation appropriately.          Comments    OOV S/P PPV/MP/FAX 20% SF6OS (03/02/18). Patient states her vision has" gotten better", denies new visual onsets.Pt is using Acular and PF gtts       Last edited by Bernarda Caffey, MD on 06/14/2018 11:07 AM. (History)    pt states she can tell that her vision is getting better  Referring physician: Kirk Ruths, MD [facility] [place], [place] [address]  HISTORICAL INFORMATION:   Selected notes from the MEDICAL RECORD NUMBER Referred by Dr. Marvel Plan for concern of mac hole OS;  LEE- 06.04.19 ([place]) [BCVA OD: 20/30-1 OS: 20/80-1] Ocular Hx- pseudophakia (2014) PMH- arthritis, HTN, high cholesterol    CURRENT MEDICATIONS: Current Outpatient Medications (Ophthalmic Drugs)  Medication Sig  . brimonidine (ALPHAGAN) 0.2 % ophthalmic solution Place 1 drop into the left eye 2 (two) times daily.  Marland Kitchen ketorolac (ACULAR) 0.5 % ophthalmic solution Place 1 drop into the left eye 4 (four) times daily.  . prednisoLONE acetate (PRED FORTE) 1 % ophthalmic suspension Place 1 drop into the left eye 4 (four) times daily.  . Tetrahydrozoline HCl (VISINE OP) Place 1 drop into both eyes daily as needed (for burning).    No current facility-administered medications for this visit.  (Ophthalmic Drugs)   Current Outpatient Medications (Other)  Medication Sig  . amLODipine (NORVASC) 5 MG tablet Take 5 mg by mouth daily.  Marland Kitchen HYDROcodone-acetaminophen (NORCO) 5-325 MG tablet Take 1 tablet by mouth every 6 (six) hours as needed  for moderate pain.  Marland Kitchen lisinopril-hydrochlorothiazide (PRINZIDE,ZESTORETIC) 20-12.5 MG tablet Take 1 tablet by mouth daily.  . pravastatin (PRAVACHOL) 80 MG tablet Take 80 mg by mouth at bedtime.  . sulfaSALAzine (AZULFIDINE) 500 MG tablet Take 1,000 mg by mouth daily.   No current facility-administered medications for this visit.  (Other)      REVIEW OF SYSTEMS: ROS    Positive for: Eyes   Negative for: Constitutional, Gastrointestinal, Neurological, Skin, Genitourinary, Musculoskeletal, HENT, Endocrine, Cardiovascular, Respiratory, Psychiatric, Allergic/Imm, Heme/Lymph   Last edited by Zenovia Jordan, LPN on 36/46/8032  1:22 AM. (History)       ALLERGIES No Known Allergies  PAST MEDICAL HISTORY Past Medical History:  Diagnosis Date  . Benign neoplasm of colon, unspecified   . Carotid artery disease ([place])   . Diverticulosis   . Hyperlipemia   . Hypertension   . Hypertensive kidney disease with CKD stage III ([place])   . Lesion of ulnar nerve   . Osteoarthritis    Past Surgical History:  Procedure Laterality Date  . bunions  1990  . CARPOMETACARPAL (CMC) FUSION OF THUMB Left 06/24/2016   Procedure: CARPOMETACARPAL [facility]) ARthroplasty OF THUMB;  Surgeon: Hessie Knows, MD;  Location: [facility] ORS;  Service: Orthopedics;  Laterality: Left;  . CATARACT EXTRACTION    . EYE SURGERY Bilateral 2014   cataract surgery  . GAS/FLUID EXCHANGE Left 03/02/2018   Procedure: GAS/FLUID EXCHANGE;  Surgeon: Bernarda Caffey, MD;  Location: [facility]  OR;  Service: Ophthalmology;  Laterality: Left;  . MEMBRANE PEEL Left 03/02/2018   Procedure: MEMBRANE PEEL;  Surgeon: Bernarda Caffey, MD;  Location: [facility];  Service: Ophthalmology;  Laterality: Left;  . PARS PLANA VITRECTOMY Left 03/02/2018   Procedure: PARS PLANA VITRECTOMY WITH 25 GAUGE;  Surgeon: Bernarda Caffey, MD;  Location: [facility];  Service: Ophthalmology;  Laterality: Left;  . SHOULDER ARTHROSCOPY Right 2014  . TONSILLECTOMY     as a child  . ULNAR NERVE  TRANSPOSITION Right     FAMILY HISTORY Family History  Problem Relation Age of Onset  . Breast cancer Mother 37    SOCIAL HISTORY Social History   Tobacco Use  . Smoking status: Never Smoker  . Smokeless tobacco: Never Used  Substance Use Topics  . Alcohol use: Yes    Comment: occasionally mixed drink  . Drug use: No         OPHTHALMIC EXAM:  Base Eye Exam    Visual Acuity (Snellen - Linear)      Right Left   Dist [place] 20/25 +2 20/30 +1   Dist ph [place] NI NI       Tonometry (Tonopen, 9:24 AM)      Right Left   Pressure 12 16       Pupils      Dark Light Shape React APD   Right 3 2 Round Brisk None   Left 3 2 Round Brisk None       Visual Fields (Counting fingers)      Left Right    Full Full       Extraocular Movement      Right Left    Full, Ortho Full, Ortho       Neuro/Psych    Oriented x3:  Yes   Mood/Affect:  Normal       Dilation    Both eyes:  1.0% Mydriacyl, 2.5% Phenylephrine @ 9:24 AM        Slit Lamp and Fundus Exam    Slit Lamp Exam      Right Left   Lids/Lashes Dermatochalasis - upper lid Dermatochalasis - upper lid   Conjunctiva/Sclera White and quiet Temporal Pinguecula, sutures desolved   Cornea 2+ inferior Punctate epithelial erosions, Temporal Well healed cataract wounds 1+ inferior Punctate epithelial erosions, Temporal Well healed cataract wounds, Arcus   Anterior Chamber Deep and quiet Deep, clear   Iris Round and dilated Round and dilated   Lens PC IOL in good position PC IOL in good position with open PC   Vitreous Vitreous syneresis, Posterior vitreous detachment post vitrectomy, mild pigment       Fundus Exam      Right Left   Disc Pink and Sharp Pink and Sharp, Tilted disc   C/D Ratio 0.4 0.4   Macula Blunted foveal reflex, mild Retinal pigment epithelial mottling, No heme or edema Blunted foveal reflex, persistent cystic changes -- mildly improved, no ERM   Vessels Normal Mild Vascular attenuation   Periphery  [place], Confluent mid-zonal drusen Attached, confluent mid-zonal drusen, DBH along IT arcade          IMAGING AND PROCEDURES  Imaging and Procedures for @TODAY @  OCT, Retina - OU - Both Eyes       Right Eye Quality was good. Central Foveal Thickness: 239. Progression has been stable. Findings include normal foveal contour, no IRF, no SRF (PVD).   Left Eye Quality was borderline. Central Foveal Thickness: 411. Progression has been stable.  Findings include intraretinal fluid, abnormal foveal contour, no SRF (Mild persistent IRF).   Notes *Images captured and stored on drive  Diagnosis / Impression:  OD: NFP, No IRF/SRF  OS: VMT resolved, mild CME/IRF vs foveoschisis -- mild improvement   Clinical management:  See below  Abbreviations: NFP - Normal foveal profile. CME - cystoid macular edema. PED - pigment epithelial detachment. IRF - intraretinal fluid. SRF - subretinal fluid. EZ - ellipsoid zone. ERM - epiretinal membrane. ORA - outer retinal atrophy. ORT - outer retinal tubulation. 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 Macular retinal cyst of left eye H35.342   3. Epiretinal membrane (ERM) of left eye H35.372   4. Retinal edema H35.81 OCT, Retina - OU - Both Eyes  5. Posterior vitreous detachment of right eye H43.811   6. Pseudophakia of both eyes Z96.1   7. PCO (posterior capsular opacification), left H26.492     1-4. 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              - IOP good at 16 today  - OCT shows ERM gone, but mild residual CME/IRF vs foveoschisis, improving  - stop Ketorolac QID OS             - start PF taper -- reduce by 1 gtts every 2 weeks (QID x2 wks, TID x2 wks,  etc)  - stop Alphagan QD OS                         PSO ung QID OS             - post op drop instructions reviewed  - can DC positioning - F/U 2 months, DFE, OCT  5. PVD / vitreous syneresis OD  Discussed findings and prognosis  No RT or RD on 360 scleral depressed exam  Reviewed s/s of RT/RD  Strict return precautions for any such RT/RD sings/symptoms  6. Pseudophakia OU  - s/p CE/IOL OU  - s/p YAG capsulotomy OS 02/08/18  - stable   Ophthalmic Meds Ordered this visit:  No orders of the defined types were placed in this encounter.      Return in about 8 weeks (around 08/09/2018) for F/U VMT, DFE, OCT.  There are no Patient Instructions on file for this visit.   Explained the diagnoses, plan, and follow up with the patient and they expressed understanding.  Patient expressed understanding of the importance of proper follow up care.   This document serves as a record of services personally performed by Gardiner Sleeper, MD, PhD. It was created on their behalf by Ernest Mallick, OA, an ophthalmic assistant. The creation of this record is the provider's dictation and/or activities during the visit.    Electronically signed by: Ernest Mallick, OA  11.25.19 1:11 PM    Gardiner Sleeper, M.D., Ph.D. Diseases & Surgery of the Retina and Vitreous [facility]   I have reviewed the above documentation for accuracy and completeness, and I agree with the above. Sharyon Cable  Coralyn Pear, M.D., Ph.D. 06/15/18 1:13 PM    Abbreviations: M myopia (nearsighted); A astigmatism; H hyperopia (farsighted); P presbyopia; Mrx spectacle prescription;  CTL contact lenses; OD right eye; OS left eye; OU both eyes  XT exotropia; ET esotropia; PEK punctate epithelial keratitis; PEE punctate epithelial erosions; DES dry eye syndrome; MGD meibomian gland dysfunction; ATs artificial tears; PFAT's preservative free artificial tears; [place] nuclear sclerotic cataract; PSC posterior subcapsular  cataract; ERM epi-retinal membrane; PVD posterior vitreous detachment; RD retinal detachment; DM diabetes mellitus; DR diabetic retinopathy; NPDR non-proliferative diabetic retinopathy; PDR proliferative diabetic retinopathy; CSME clinically significant macular edema; DME diabetic macular edema; dbh dot blot hemorrhages; CWS cotton wool spot; POAG primary open angle glaucoma; C/D cup-to-disc ratio; HVF humphrey visual field; GVF goldmann visual field; OCT optical coherence tomography; IOP intraocular pressure; BRVO Branch retinal vein occlusion; CRVO central retinal vein occlusion; CRAO central retinal artery occlusion; BRAO branch retinal artery occlusion; RT retinal tear; SB scleral buckle; PPV pars plana vitrectomy; VH Vitreous hemorrhage; PRP panretinal laser photocoagulation; IVK intravitreal kenalog; VMT vitreomacular traction; MH Macular hole;  NVD neovascularization of the disc; NVE neovascularization elsewhere; AREDS age related eye disease study; ARMD age related macular degeneration; POAG primary open angle glaucoma; EBMD epithelial/anterior basement membrane dystrophy; ACIOL anterior chamber intraocular lens; IOL intraocular lens; PCIOL posterior chamber intraocular lens; Phaco/IOL phacoemulsification with intraocular lens placement; [place] photorefractive keratectomy; LASIK laser assisted in situ keratomileusis; HTN hypertension; DM diabetes mellitus; COPD chronic obstructive pulmonary disease

## 2018-06-14 ENCOUNTER — Encounter (INDEPENDENT_AMBULATORY_CARE_PROVIDER_SITE_OTHER): Payer: Self-pay | Admitting: Ophthalmology

## 2018-06-14 ENCOUNTER — Ambulatory Visit (INDEPENDENT_AMBULATORY_CARE_PROVIDER_SITE_OTHER): Payer: Medicare Other | Admitting: Ophthalmology

## 2018-06-14 DIAGNOSIS — H26492 Other secondary cataract, left eye: Secondary | ICD-10-CM

## 2018-06-14 DIAGNOSIS — H43822 Vitreomacular adhesion, left eye: Secondary | ICD-10-CM

## 2018-06-14 DIAGNOSIS — H35372 Puckering of macula, left eye: Secondary | ICD-10-CM | POA: Diagnosis not present

## 2018-06-14 DIAGNOSIS — H3581 Retinal edema: Secondary | ICD-10-CM | POA: Diagnosis not present

## 2018-06-14 DIAGNOSIS — H35342 Macular cyst, hole, or pseudohole, left eye: Secondary | ICD-10-CM | POA: Diagnosis not present

## 2018-06-14 DIAGNOSIS — Z961 Presence of intraocular lens: Secondary | ICD-10-CM

## 2018-06-14 DIAGNOSIS — H43811 Vitreous degeneration, right eye: Secondary | ICD-10-CM

## 2018-06-15 ENCOUNTER — Encounter (INDEPENDENT_AMBULATORY_CARE_PROVIDER_SITE_OTHER): Payer: Self-pay | Admitting: Ophthalmology

## 2018-08-15 NOTE — Progress Notes (Addendum)
[facility] Note  08/17/2018     CHIEF COMPLAINT Patient presents for Retina Follow Up   HISTORY OF PRESENT ILLNESS: Hannah Hooper is a 85 y.o. female who presents to the clinic today for:   HPI    Retina Follow Up    Patient presents with  Other.  In left eye.  Severity is moderate.  Duration of 2 months.  Since onset it is stable.  I, the attending physician,  performed the HPI with the patient and updated documentation appropriately.          Comments    Retinal follow up for Mac cyst/Erm os. Patient states no changes since last visit.       Last edited by Bernarda Caffey, MD on 08/17/2018 10:00 AM. (History)    pt states she is down to one drop a day of PF  Referring physician: Kirk Ruths, MD [place], [place] [address]  HISTORICAL INFORMATION:   Selected notes from the MEDICAL RECORD NUMBER Referred by Dr. Marvel Plan for concern of mac hole OS;  LEE- 06.04.19 ([facility]) [BCVA OD: 20/30-1 OS: 20/80-1] Ocular Hx- pseudophakia (2014) PMH- arthritis, HTN, high cholesterol    CURRENT MEDICATIONS: Current Outpatient Medications (Ophthalmic Drugs)  Medication Sig  . brimonidine (ALPHAGAN) 0.2 % ophthalmic solution Place 1 drop into the left eye 2 (two) times daily.  Marland Kitchen ketorolac (ACULAR) 0.5 % ophthalmic solution Place 1 drop into the left eye 4 (four) times daily.  . prednisoLONE acetate (PRED FORTE) 1 % ophthalmic suspension Place 1 drop into the left eye 4 (four) times daily.  . Tetrahydrozoline HCl (VISINE OP) Place 1 drop into both eyes daily as needed (for burning).    No current facility-administered medications for this visit.  (Ophthalmic Drugs)   Current Outpatient Medications (Other)  Medication Sig  . amLODipine (NORVASC) 5 MG tablet Take 5 mg by mouth daily.  Marland Kitchen HYDROcodone-acetaminophen (NORCO) 5-325 MG tablet Take 1 tablet by mouth every 6 (six) hours as needed for moderate pain.   Marland Kitchen lisinopril-hydrochlorothiazide (PRINZIDE,ZESTORETIC) 20-12.5 MG tablet Take 1 tablet by mouth daily.  . pravastatin (PRAVACHOL) 80 MG tablet Take 80 mg by mouth at bedtime.  . sulfaSALAzine (AZULFIDINE) 500 MG tablet Take 1,000 mg by mouth daily.   No current facility-administered medications for this visit.  (Other)      REVIEW OF SYSTEMS: ROS    Positive for: Eyes   Negative for: Constitutional, Gastrointestinal, Neurological, Skin, Genitourinary, Musculoskeletal, HENT, Endocrine, Cardiovascular, Respiratory, Psychiatric, Allergic/Imm, Heme/Lymph   Last edited by Elmore Guise on 08/17/2018  9:27 AM. (History)       ALLERGIES No Known Allergies  PAST MEDICAL HISTORY Past Medical History:  Diagnosis Date  . Benign neoplasm of colon, unspecified   . Carotid artery disease ([place])   . Diverticulosis   . Hyperlipemia   . Hypertension   . Hypertensive kidney disease with CKD stage III ([place])   . Lesion of ulnar nerve   . Osteoarthritis    Past Surgical History:  Procedure Laterality Date  . bunions  1990  . CARPOMETACARPAL ([facility]) FUSION OF THUMB Left 06/24/2016   Procedure: CARPOMETACARPAL [facility]) ARthroplasty OF THUMB;  Surgeon: Hessie Knows, MD;  Location: [facility] ORS;  Service: Orthopedics;  Laterality: Left;  . CATARACT EXTRACTION    . EYE SURGERY Bilateral 2014   cataract surgery  . GAS/FLUID EXCHANGE Left 03/02/2018   Procedure: GAS/FLUID EXCHANGE;  Surgeon: Bernarda Caffey, MD;  Location: [facility];  Service: Ophthalmology;  Laterality: Left;  . MEMBRANE PEEL Left 03/02/2018   Procedure: MEMBRANE PEEL;  Surgeon: Bernarda Caffey, MD;  Location: [place];  Service: Ophthalmology;  Laterality: Left;  . PARS PLANA VITRECTOMY Left 03/02/2018   Procedure: PARS PLANA VITRECTOMY WITH 25 GAUGE;  Surgeon: Bernarda Caffey, MD;  Location: [place];  Service: Ophthalmology;  Laterality: Left;  . SHOULDER ARTHROSCOPY Right 2014  . TONSILLECTOMY     as a child  . ULNAR NERVE TRANSPOSITION Right      FAMILY HISTORY Family History  Problem Relation Age of Onset  . Breast cancer Mother 14    SOCIAL HISTORY Social History   Tobacco Use  . Smoking status: Never Smoker  . Smokeless tobacco: Never Used  Substance Use Topics  . Alcohol use: Yes    Comment: occasionally mixed drink  . Drug use: No         OPHTHALMIC EXAM:  Base Eye Exam    Visual Acuity (Snellen - Linear)      Right Left   Dist [place] 20/25-2 20/30   Dist ph [place] 20/20-2 20/25-3       Tonometry (Tonopen, 9:28 AM)      Right Left   Pressure 19 18       Pupils      Dark Light Shape React APD   Right 3 2 Round Brisk None   Left 3 2 Round Brisk None       Visual Fields (Counting fingers)      Left Right    Full Full       Extraocular Movement      Right Left    Full, Ortho Full, Ortho       Neuro/Psych    Oriented x3:  Yes   Mood/Affect:  Normal       Dilation    Both eyes:  1.0% Mydriacyl, 2.5% Phenylephrine @ 9:29 AM        Slit Lamp and Fundus Exam    Slit Lamp Exam      Right Left   Lids/Lashes Dermatochalasis - upper lid Dermatochalasis - upper lid   Conjunctiva/Sclera White and quiet Temporal Pinguecula, sutures desolved   Cornea 2-3+ inferior Punctate epithelial erosions, Temporal Well healed cataract wounds trace inferior Punctate epithelial erosions, Temporal Well healed cataract wounds, Arcus   Anterior Chamber Deep and quiet Deep, clear   Iris Round and dilated Round and dilated   Lens PC IOL in good position PC IOL in good position with open PC   Vitreous Vitreous syneresis, Posterior vitreous detachment post vitrectomy, mild pigment       Fundus Exam      Right Left   Disc Pink and Sharp Pink and Sharp, Tilted disc   C/D Ratio 0.5 0.4   Macula Blunted foveal reflex, mild Retinal pigment epithelial mottling, No heme or edema Blunted foveal reflex, trace cystic changes -- improving, no ERM   Vessels Normal Mild Vascular attenuation   Periphery Attahced, Confluent  mid-zonal drusen Attached, confluent mid-zonal drusen, DBH along IT arcade          IMAGING AND PROCEDURES  Imaging and Procedures for _0 @  OCT, Retina - OU - Both Eyes       Right Eye Quality was good. Central Foveal Thickness: 233. Progression has been stable. Findings include normal foveal contour, no IRF, no SRF (PVD).   Left Eye Quality was borderline. Central Foveal Thickness: 356.  Progression has improved. Findings include intraretinal fluid, no SRF, normal foveal contour (Mild persistent cystic changes and foveal contour).   Notes *Images captured and stored on drive  Diagnosis / Impression:  OD: NFP, No IRF/SRF  OS: VMT resolved, interval improvement in foveal contour and resolving foveoschisis  Clinical management:  See below  Abbreviations: NFP - Normal foveal profile. CME - cystoid macular edema. PED - pigment epithelial detachment. IRF - intraretinal fluid. SRF - subretinal fluid. EZ - ellipsoid zone. ERM - epiretinal membrane. ORA - outer retinal atrophy. ORT - outer retinal tubulation. 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 Macular retinal cyst of left eye H35.342   3. Epiretinal membrane (ERM) of left eye H35.372   4. Retinal edema H35.81 OCT, Retina - OU - Both Eyes  5. Posterior vitreous detachment of right eye H43.811   6. Pseudophakia of both eyes Z96.1     1-4. 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              - IOP good at 18 today  - OCT shows ERM gone, but mild residual CME/IRF vs foveoschisis, improved             - cont PF taper -- currently one 1 drop Qdalily             - post op drop instructions reviewed  - cleared from a retina standpoint for MRx  -  F/U 2-3 months, DFE, OCT  5. PVD / vitreous syneresis OD  Discussed findings and prognosis  No RT or RD on 360 scleral depressed exam  Reviewed s/s of RT/RD  Strict return precautions for any such RT/RD signs/symptoms  6. Pseudophakia OU  - s/p CE/IOL OU  - s/p YAG capsulotomy OS 02/08/18  - stable   Ophthalmic Meds Ordered this visit:  No orders of the defined types were placed in this encounter.      Return for 2-3 mos, Dilated Exam, OCT.  There are no Patient Instructions on file for this visit.   Explained the diagnoses, plan, and follow up with the patient and they expressed understanding.  Patient expressed understanding of the importance of proper follow up care.   This document serves as a record of services personally performed by Gardiner Sleeper, MD, PhD. It was created on their behalf by Ernest Mallick, OA, an ophthalmic assistant. The creation of this record is the provider's dictation and/or activities during the visit.    Electronically signed by: Ernest Mallick, OA  01.28.2020 10:27 AM     Gardiner Sleeper, M.D., Ph.D. Diseases & Surgery of the Retina and Vitreous [facility]   I have reviewed the above documentation for accuracy and completeness, and I agree with the above. Gardiner Sleeper, M.D., Ph.D. 08/17/18 10:27 AM    Abbreviations: M myopia (nearsighted); A astigmatism; H hyperopia (farsighted); P presbyopia; Mrx spectacle prescription;  CTL contact lenses; OD right eye; OS left eye; OU both eyes  XT exotropia; ET esotropia; PEK punctate epithelial keratitis; PEE punctate epithelial erosions; DES  dry eye syndrome; MGD meibomian gland dysfunction; ATs artificial tears; PFAT's preservative free artificial tears; [place] nuclear sclerotic cataract; PSC posterior subcapsular cataract; ERM epi-retinal membrane; PVD posterior vitreous detachment; RD retinal detachment; DM diabetes mellitus; DR diabetic retinopathy; NPDR non-proliferative  diabetic retinopathy; PDR proliferative diabetic retinopathy; CSME clinically significant macular edema; DME diabetic macular edema; dbh dot blot hemorrhages; CWS cotton wool spot; POAG primary open angle glaucoma; C/D cup-to-disc ratio; HVF humphrey visual field; GVF goldmann visual field; OCT optical coherence tomography; IOP intraocular pressure; BRVO Branch retinal vein occlusion; CRVO central retinal vein occlusion; CRAO central retinal artery occlusion; BRAO branch retinal artery occlusion; RT retinal tear; SB scleral buckle; PPV pars plana vitrectomy; VH Vitreous hemorrhage; PRP panretinal laser photocoagulation; IVK intravitreal kenalog; VMT vitreomacular traction; MH Macular hole;  NVD neovascularization of the disc; NVE neovascularization elsewhere; AREDS age related eye disease study; ARMD age related macular degeneration; POAG primary open angle glaucoma; EBMD epithelial/anterior basement membrane dystrophy; ACIOL anterior chamber intraocular lens; IOL intraocular lens; PCIOL posterior chamber intraocular lens; Phaco/IOL phacoemulsification with intraocular lens placement; [place] photorefractive keratectomy; LASIK laser assisted in situ keratomileusis; HTN hypertension; DM diabetes mellitus; COPD chronic obstructive pulmonary disease

## 2018-08-17 ENCOUNTER — Encounter (INDEPENDENT_AMBULATORY_CARE_PROVIDER_SITE_OTHER): Payer: Self-pay | Admitting: Ophthalmology

## 2018-08-17 ENCOUNTER — Ambulatory Visit (INDEPENDENT_AMBULATORY_CARE_PROVIDER_SITE_OTHER): Payer: Medicare Other | Admitting: Ophthalmology

## 2018-08-17 DIAGNOSIS — H3581 Retinal edema: Secondary | ICD-10-CM | POA: Diagnosis not present

## 2018-08-17 DIAGNOSIS — Z961 Presence of intraocular lens: Secondary | ICD-10-CM

## 2018-08-17 DIAGNOSIS — H35342 Macular cyst, hole, or pseudohole, left eye: Secondary | ICD-10-CM

## 2018-08-17 DIAGNOSIS — H35372 Puckering of macula, left eye: Secondary | ICD-10-CM | POA: Diagnosis not present

## 2018-08-17 DIAGNOSIS — H43822 Vitreomacular adhesion, left eye: Secondary | ICD-10-CM

## 2018-08-17 DIAGNOSIS — H43811 Vitreous degeneration, right eye: Secondary | ICD-10-CM

## 2018-10-23 ENCOUNTER — Encounter (INDEPENDENT_AMBULATORY_CARE_PROVIDER_SITE_OTHER): Payer: Medicare Other | Admitting: Ophthalmology

## 2018-10-27 IMAGING — MG MM DIGITAL SCREENING BILAT W/ CAD
5 series · 5 of 5 positions shown · non-contrast
Comparison: Previous exam(s).

CLINICAL DATA: Screening.

EXAM:
DIGITAL SCREENING BILATERAL MAMMOGRAM WITH CAD

[R CC (1 of 2)]
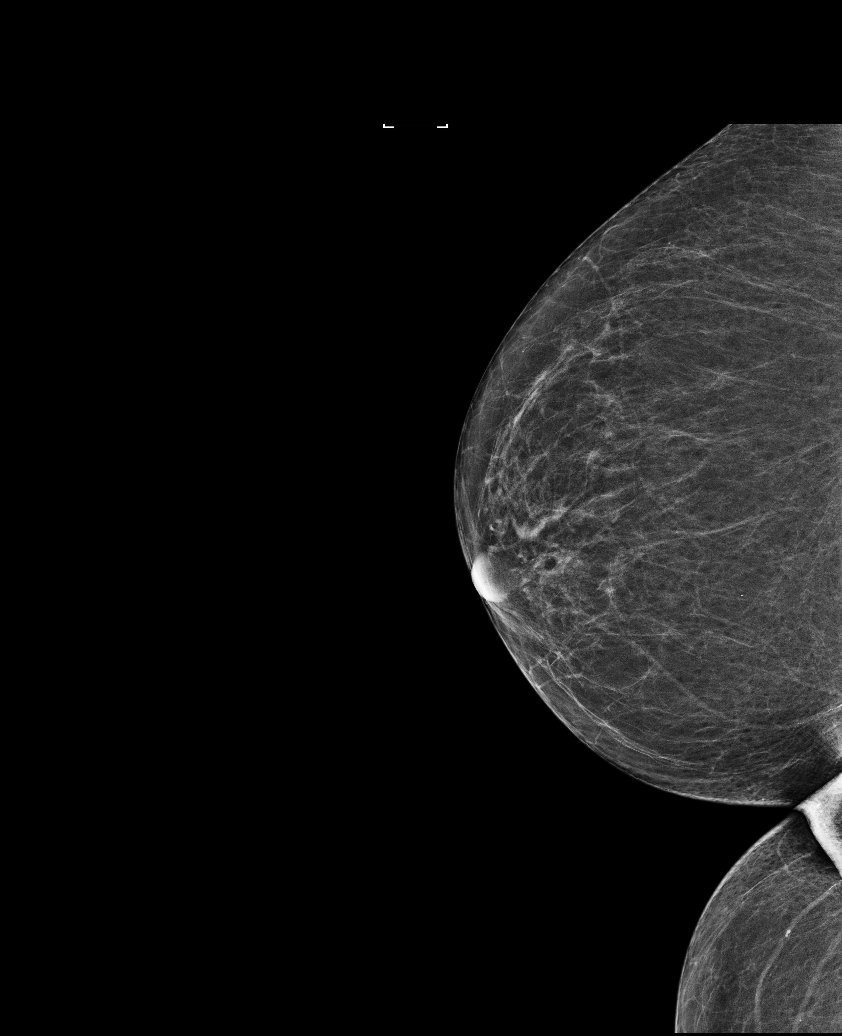

[R CC (2 of 2)]
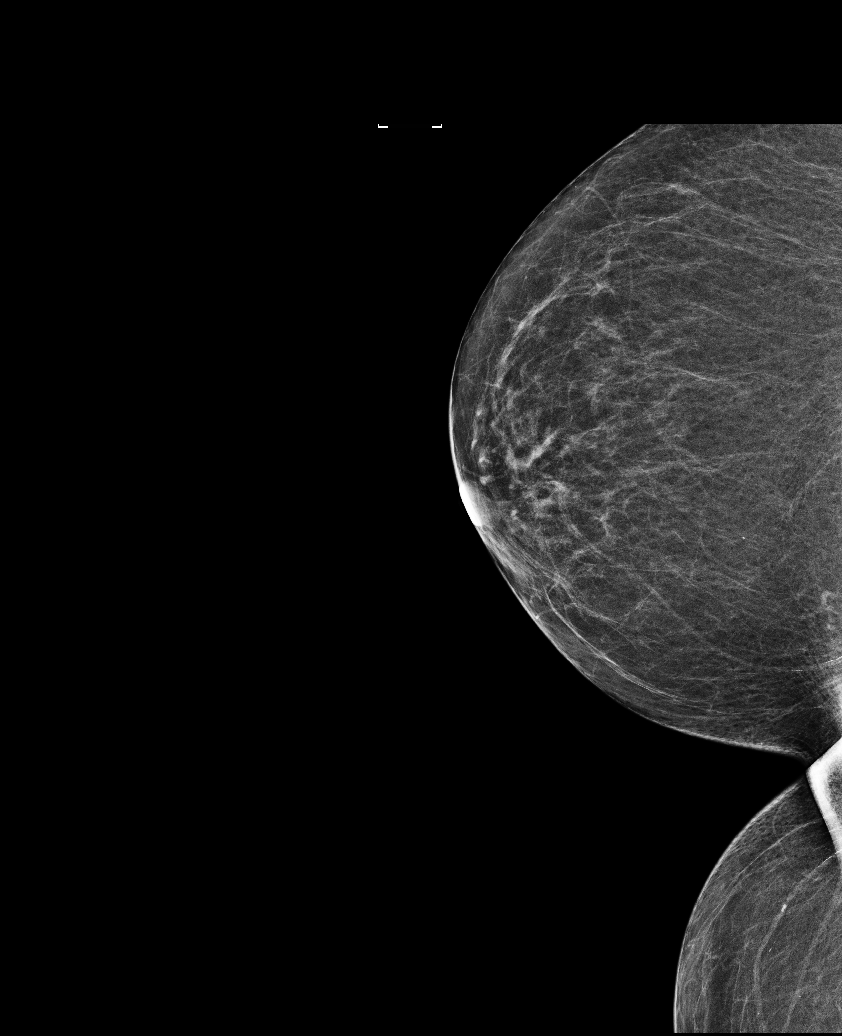

[R MLO]
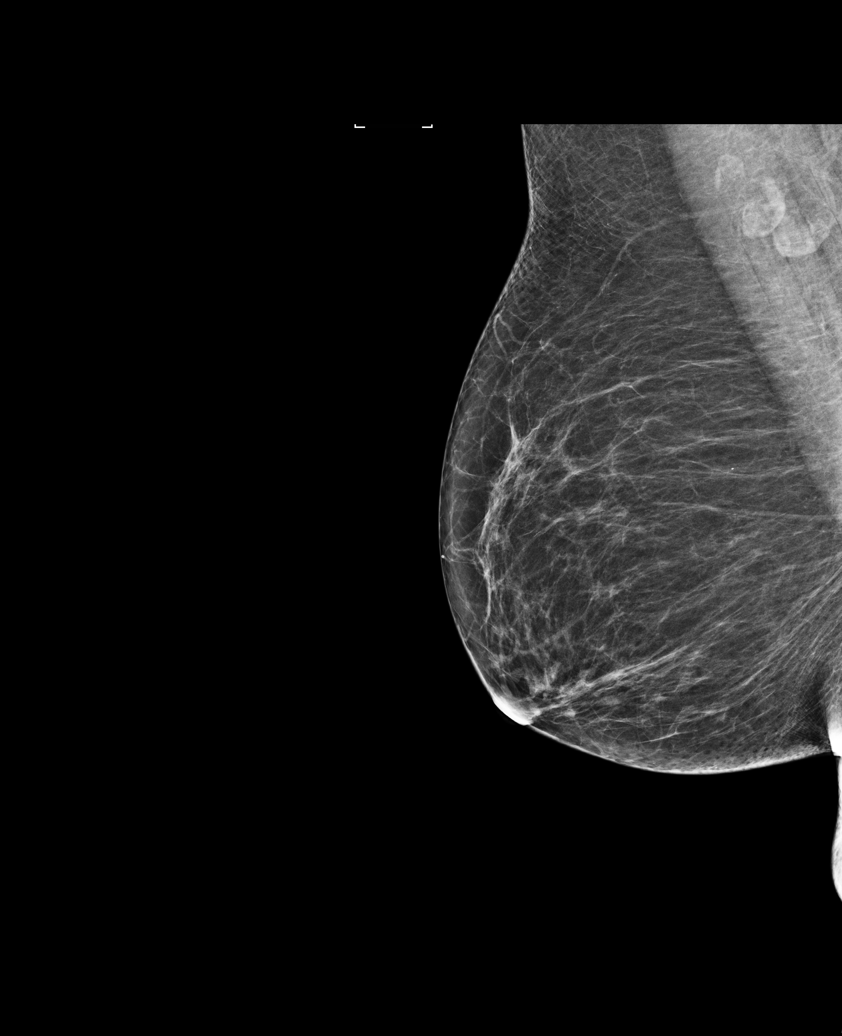

[L MLO]
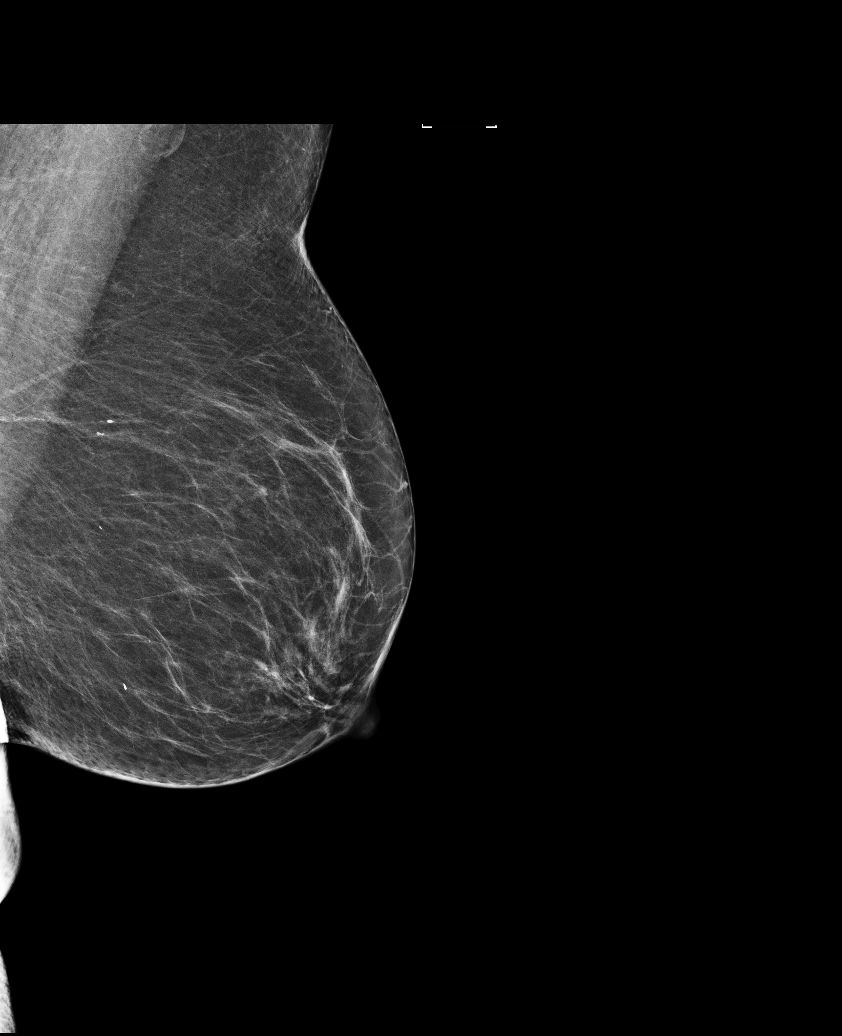

[L CC]
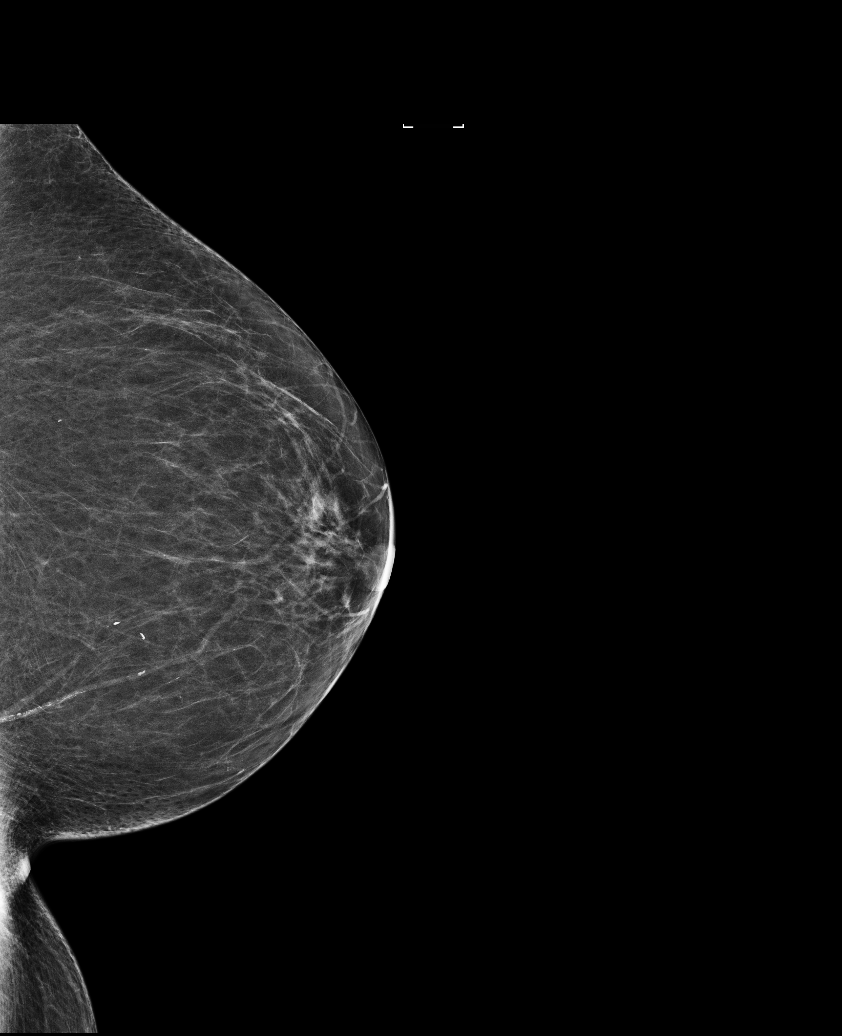

[5 of 5 positions shown; findings below may reference images not displayed]

ACR Breast Density Category b: There are scattered areas of
fibroglandular density.
FINDINGS: There are no findings suspicious for malignancy. Images were
processed with CAD.
IMPRESSION: No mammographic evidence of malignancy. A result letter of this
screening mammogram will be mailed directly to the patient.

RECOMMENDATION:
Screening mammogram in one year. (Code:AS-G-LCT)

BI-RADS CATEGORY  1: Negative.

## 2018-11-10 NOTE — Progress Notes (Signed)
[facility] Note  11/13/2018     CHIEF COMPLAINT Patient presents for Retina Evaluation   HISTORY OF PRESENT ILLNESS: Hannah Hooper is a 85 y.o. female who presents to the clinic today for:   HPI    Retina Evaluation    In left eye.  This started months ago.  Duration of months.  Response to treatment was mild improvement.  I, the attending physician,  performed the HPI with the patient and updated documentation appropriately.          Comments    Patient states her vision in her left eye continues to improve.  She denies eye pain or discomfort and denies any new or worsening floaters or flashes of light OU.       Last edited by Bernarda Caffey, MD on 11/13/2018 12:51 PM. (History)    pt states she is down to one drop a day of PF  Referring physician: Kirk Ruths, MD [address] [place], [place] [address]  HISTORICAL INFORMATION:   Selected notes from the MEDICAL RECORD NUMBER Referred by Dr. Marvel Plan for concern of mac hole OS;  LEE- 06.04.19 ([place]) [BCVA OD: 20/30-1 OS: 20/80-1] Ocular Hx- pseudophakia (2014) PMH- arthritis, HTN, high cholesterol    CURRENT MEDICATIONS: Current Outpatient Medications (Ophthalmic Drugs)  Medication Sig  . brimonidine (ALPHAGAN) 0.2 % ophthalmic solution Place 1 drop into the left eye 2 (two) times daily.  Marland Kitchen ketorolac (ACULAR) 0.5 % ophthalmic solution Place 1 drop into the left eye 4 (four) times daily.  . prednisoLONE acetate (PRED FORTE) 1 % ophthalmic suspension Place 1 drop into the left eye 4 (four) times daily.  . Tetrahydrozoline HCl (VISINE OP) Place 1 drop into both eyes daily as needed (for burning).    No current facility-administered medications for this visit.  (Ophthalmic Drugs)   Current Outpatient Medications (Other)  Medication Sig  . amLODipine (NORVASC) 5 MG tablet Take 5 mg by mouth daily.  Marland Kitchen HYDROcodone-acetaminophen (NORCO) 5-325 MG tablet  Take 1 tablet by mouth every 6 (six) hours as needed for moderate pain.  Marland Kitchen lisinopril-hydrochlorothiazide (PRINZIDE,ZESTORETIC) 20-12.5 MG tablet Take 1 tablet by mouth daily.  . pravastatin (PRAVACHOL) 80 MG tablet Take 80 mg by mouth at bedtime.  . sulfaSALAzine (AZULFIDINE) 500 MG tablet Take 1,000 mg by mouth daily.   No current facility-administered medications for this visit.  (Other)      REVIEW OF SYSTEMS: ROS    Positive for: Eyes   Negative for: Constitutional, Gastrointestinal, Neurological, Skin, Genitourinary, Musculoskeletal, HENT, Endocrine, Cardiovascular, Respiratory, Psychiatric, Allergic/Imm, Heme/Lymph   Last edited by Doneen Poisson on 11/13/2018 10:06 AM. (History)       ALLERGIES No Known Allergies  PAST MEDICAL HISTORY Past Medical History:  Diagnosis Date  . Benign neoplasm of colon, unspecified   . Carotid artery disease ([place])   . Diverticulosis   . Hyperlipemia   . Hypertension   . Hypertensive kidney disease with CKD stage III ([place])   . Lesion of ulnar nerve   . Osteoarthritis    Past Surgical History:  Procedure Laterality Date  . bunions  1990  . CARPOMETACARPAL ([facility]) FUSION OF THUMB Left 06/24/2016   Procedure: CARPOMETACARPAL [facility]) ARthroplasty OF THUMB;  Surgeon: Hessie Knows, MD;  Location: [facility] ORS;  Service: Orthopedics;  Laterality: Left;  . CATARACT EXTRACTION    . EYE SURGERY Bilateral 2014   cataract surgery  . GAS/FLUID EXCHANGE  Left 03/02/2018   Procedure: GAS/FLUID EXCHANGE;  Surgeon: Bernarda Caffey, MD;  Location: [facility];  Service: Ophthalmology;  Laterality: Left;  . MEMBRANE PEEL Left 03/02/2018   Procedure: MEMBRANE PEEL;  Surgeon: Bernarda Caffey, MD;  Location: [facility];  Service: Ophthalmology;  Laterality: Left;  . PARS PLANA VITRECTOMY Left 03/02/2018   Procedure: PARS PLANA VITRECTOMY WITH 25 GAUGE;  Surgeon: Bernarda Caffey, MD;  Location: [facility];  Service: Ophthalmology;  Laterality: Left;  . SHOULDER ARTHROSCOPY Right 2014   . TONSILLECTOMY     as a child  . ULNAR NERVE TRANSPOSITION Right     FAMILY HISTORY Family History  Problem Relation Age of Onset  . Breast cancer Mother 65    SOCIAL HISTORY Social History   Tobacco Use  . Smoking status: Never Smoker  . Smokeless tobacco: Never Used  Substance Use Topics  . Alcohol use: Yes    Comment: occasionally mixed drink  . Drug use: No         OPHTHALMIC EXAM:  Base Eye Exam    Visual Acuity (Snellen - Linear)      Right Left   Dist [place] 20/25 -1 20/30 +2   Dist ph [place] 20/20 -2 20/25 -3       Tonometry (Tonopen, 10:04 AM)      Right Left   Pressure 7 8       Pupils      Dark Light Shape React APD   Right 3 2 Round Minimal 0   Left 3 2 Round Minimal 0       Extraocular Movement      Right Left    Full Full       Neuro/Psych    Oriented x3:  Yes   Mood/Affect:  Normal       Dilation    Both eyes:  1.0% Mydriacyl, 2.5% Phenylephrine @ 10:04 AM        Slit Lamp and Fundus Exam    Slit Lamp Exam      Right Left   Lids/Lashes Dermatochalasis - upper lid Dermatochalasis - upper lid   Conjunctiva/Sclera White and quiet Temporal Pinguecula, sutures desolved   Cornea 2-3+ inferior Punctate epithelial erosions, Temporal Well healed cataract wounds trace inferior Punctate epithelial erosions, Temporal Well healed cataract wounds, Arcus   Anterior Chamber Deep and quiet Deep, clear   Iris Round and dilated Round and dilated   Lens PC IOL in good position PC IOL in good position with open PC   Vitreous Vitreous syneresis, Posterior vitreous detachment post vitrectomy, mild pigment       Fundus Exam      Right Left   Disc Pink and Sharp Pink and Sharp, Tilted disc   C/D Ratio 0.5 0.4   Macula Blunted foveal reflex, mild Retinal pigment epithelial mottling, No heme or edema Blunted foveal reflex, trace cystic changes -- improving, no ERM   Vessels Normal Mild Vascular attenuation   Periphery Attahced, Confluent mid-zonal drusen  Attached, confluent mid-zonal drusen, DBH along IT arcade        Refraction    Wearing Rx      Sphere Cylinder Axis   Right +2.25 +0.25 003   Left +2.75 +0.25 002   Type:  SVL          IMAGING AND PROCEDURES  Imaging and Procedures for _0 @  OCT, Retina - OU - Both Eyes       Right Eye Quality was good. Central Foveal Thickness:  230. Progression has been stable. Findings include normal foveal contour, no IRF, no SRF (PVD).   Left Eye Quality was borderline. Central Foveal Thickness: 345. Progression has improved. Findings include intraretinal fluid, no SRF, normal foveal contour (Interval improvement in IRF; foveoschisis resolved).   Notes *Images captured and stored on drive  Diagnosis / Impression:  OD: NFP, No IRF/SRF  OS: VMT resolved, interval improvement in foveal contour and IRF; resolved foveoschisis  Clinical management:  See below  Abbreviations: NFP - Normal foveal profile. CME - cystoid macular edema. PED - pigment epithelial detachment. IRF - intraretinal fluid. SRF - subretinal fluid. EZ - ellipsoid zone. ERM - epiretinal membrane. ORA - outer retinal atrophy. ORT - outer retinal tubulation. 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 Macular retinal cyst of left eye H35.342   3. Epiretinal membrane (ERM) of left eye H35.372   4. Retinal edema H35.81 OCT, Retina - OU - Both Eyes  5. Posterior vitreous detachment of right eye H43.811   6. Pseudophakia of both eyes Z96.1   7. PCO (posterior capsular opacification), left H26.492     1-4. 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              - IOP good at 8 today  - OCT shows VMT resolved, interval improvement in foveal  contour and IRF; resolved foveoschisis  - clear from a retina standpoint for MRx  - F/U 6 months, DFE, OCT  5. PVD / vitreous syneresis OD  Discussed findings and prognosis  No RT or RD on 360 scleral depressed exam  Reviewed s/s of RT/RD  Strict return precautions for any such RT/RD signs/symptoms  6. Pseudophakia OU  - s/p CE/IOL OU  - s/p YAG capsulotomy OS 02/08/18  - stable   Ophthalmic Meds Ordered this visit:  No orders of the defined types were placed in this encounter.      Return in about 6 months (around 05/15/2019) for f/u macular cyst OS, DFE, OCT.  There are no Patient Instructions on file for this visit.   Explained the diagnoses, plan, and follow up with the patient and they expressed understanding.  Patient expressed understanding of the importance of proper follow up care.   This document serves as a record of services personally performed by Gardiner Sleeper, MD, PhD. It was created on their behalf by Ernest Mallick, OA, an ophthalmic assistant. The creation of this record is the provider's dictation and/or activities during the visit.    Electronically signed by: Ernest Mallick, OA 04.24.2020 12:55 PM    Gardiner Sleeper, M.D., Ph.D. Diseases & Surgery of the Retina and Vitreous [facility]  I have reviewed the above documentation for accuracy and completeness, and I agree with the above. Gardiner Sleeper, M.D., Ph.D. 11/13/18 12:55 PM     Abbreviations: M myopia (nearsighted); A astigmatism; H hyperopia (farsighted); P presbyopia; Mrx spectacle prescription;  CTL contact lenses; OD right eye; OS left eye; OU both eyes  XT exotropia; ET esotropia; PEK punctate epithelial keratitis; PEE punctate epithelial  erosions; DES dry eye syndrome; MGD meibomian gland dysfunction; ATs artificial tears; PFAT's preservative free artificial tears; [place] nuclear sclerotic cataract; PSC posterior subcapsular cataract; ERM epi-retinal membrane; PVD posterior  vitreous detachment; RD retinal detachment; DM diabetes mellitus; DR diabetic retinopathy; NPDR non-proliferative diabetic retinopathy; PDR proliferative diabetic retinopathy; CSME clinically significant macular edema; DME diabetic macular edema; dbh dot blot hemorrhages; CWS cotton wool spot; POAG primary open angle glaucoma; C/D cup-to-disc ratio; HVF humphrey visual field; GVF goldmann visual field; OCT optical coherence tomography; IOP intraocular pressure; BRVO Branch retinal vein occlusion; CRVO central retinal vein occlusion; CRAO central retinal artery occlusion; BRAO branch retinal artery occlusion; RT retinal tear; SB scleral buckle; PPV pars plana vitrectomy; VH Vitreous hemorrhage; PRP panretinal laser photocoagulation; IVK intravitreal kenalog; VMT vitreomacular traction; MH Macular hole;  NVD neovascularization of the disc; NVE neovascularization elsewhere; AREDS age related eye disease study; ARMD age related macular degeneration; POAG primary open angle glaucoma; EBMD epithelial/anterior basement membrane dystrophy; ACIOL anterior chamber intraocular lens; IOL intraocular lens; PCIOL posterior chamber intraocular lens; Phaco/IOL phacoemulsification with intraocular lens placement; [place] photorefractive keratectomy; LASIK laser assisted in situ keratomileusis; HTN hypertension; DM diabetes mellitus; COPD chronic obstructive pulmonary disease

## 2018-11-13 ENCOUNTER — Ambulatory Visit (INDEPENDENT_AMBULATORY_CARE_PROVIDER_SITE_OTHER): Payer: Medicare Other | Admitting: Ophthalmology

## 2018-11-13 ENCOUNTER — Encounter (INDEPENDENT_AMBULATORY_CARE_PROVIDER_SITE_OTHER): Payer: Self-pay | Admitting: Ophthalmology

## 2018-11-13 ENCOUNTER — Other Ambulatory Visit: Payer: Self-pay

## 2018-11-13 DIAGNOSIS — H35342 Macular cyst, hole, or pseudohole, left eye: Secondary | ICD-10-CM

## 2018-11-13 DIAGNOSIS — H3581 Retinal edema: Secondary | ICD-10-CM | POA: Diagnosis not present

## 2018-11-13 DIAGNOSIS — H43822 Vitreomacular adhesion, left eye: Secondary | ICD-10-CM | POA: Diagnosis not present

## 2018-11-13 DIAGNOSIS — H43811 Vitreous degeneration, right eye: Secondary | ICD-10-CM

## 2018-11-13 DIAGNOSIS — H35372 Puckering of macula, left eye: Secondary | ICD-10-CM

## 2018-11-13 DIAGNOSIS — Z961 Presence of intraocular lens: Secondary | ICD-10-CM

## 2018-12-25 ENCOUNTER — Other Ambulatory Visit: Payer: Self-pay | Admitting: Internal Medicine

## 2018-12-25 DIAGNOSIS — Z1231 Encounter for screening mammogram for malignant neoplasm of breast: Secondary | ICD-10-CM

## 2019-02-05 ENCOUNTER — Other Ambulatory Visit: Payer: Self-pay

## 2019-02-05 ENCOUNTER — Ambulatory Visit
Admission: RE | Admit: 2019-02-05 | Discharge: 2019-02-05 | Disposition: A | Discharge status: Home / Self Care | Payer: Medicare Other | Source: Ambulatory Visit | Attending: Internal Medicine | Admitting: Internal Medicine

## 2019-02-05 DIAGNOSIS — Z1231 Encounter for screening mammogram for malignant neoplasm of breast: Secondary | ICD-10-CM | POA: Insufficient documentation

## 2019-05-07 NOTE — Progress Notes (Signed)
[facility] Note  05/15/2019   CHIEF COMPLAINT Patient presents for Retina Follow Up  HISTORY OF PRESENT ILLNESS: Hannah Hooper is a 85 y.o. female who presents to the clinic today for:  HPI    Retina Follow Up    Patient presents with  Other.  In left eye.  This started 1 year ago.  Severity is mild.  Duration of 6 months.  Since onset it is gradually improving.  I, the attending physician,  performed the HPI with the patient and updated documentation appropriately.          Comments    85 y/o female pt here for 6 mo f/u for mac cyst OS.  S/p PPV/MP OS 8.15.19.  VA good OU, and improved OS since last visit.  Denies pain, flashes, floaters.  No gtts.       Last edited by Bernarda Caffey, MD on 05/16/2019 12:19 AM. (History)    Pt is doing well, she states her eye dr told her she doesn't need to wear glasses   Referring physician: [facility] [place],  [place] [address]  HISTORICAL INFORMATION:   Selected notes from the MEDICAL RECORD NUMBER Referred by Dr. Marvel Plan for concern of mac hole OS    CURRENT MEDICATIONS: Current Outpatient Medications (Ophthalmic Drugs)  Medication Sig  . Tetrahydrozoline HCl (VISINE OP) Place 1 drop into both eyes daily as needed (for burning).   Marland Kitchen ketorolac (ACULAR) 0.5 % ophthalmic solution Place 1 drop into the left eye 4 (four) times daily. (Patient not taking: Reported on 05/15/2019)  . prednisoLONE acetate (PRED FORTE) 1 % ophthalmic suspension Place 1 drop into the left eye 4 (four) times daily. (Patient not taking: Reported on 05/15/2019)   No current facility-administered medications for this visit.  (Ophthalmic Drugs)   Current Outpatient Medications (Other)  Medication Sig  . amLODipine (NORVASC) 5 MG tablet Take 5 mg by mouth daily.  Marland Kitchen HYDROcodone-acetaminophen (NORCO) 5-325 MG tablet Take 1 tablet by mouth every 6 (six) hours as needed for moderate pain.  Marland Kitchen  lisinopril-hydrochlorothiazide (PRINZIDE,ZESTORETIC) 20-12.5 MG tablet Take 1 tablet by mouth daily.  . pravastatin (PRAVACHOL) 80 MG tablet Take 80 mg by mouth at bedtime.  . sulfaSALAzine (AZULFIDINE) 500 MG tablet Take 1,000 mg by mouth daily.   No current facility-administered medications for this visit.  (Other)      REVIEW OF SYSTEMS: ROS    Positive for: Eyes   Negative for: Constitutional, Gastrointestinal, Neurological, Skin, Genitourinary, Musculoskeletal, HENT, Endocrine, Cardiovascular, Respiratory, Psychiatric, Allergic/Imm, Heme/Lymph   Last edited by Matthew Folks, COA on 05/15/2019 10:08 AM. (History)       ALLERGIES No Known Allergies  PAST MEDICAL HISTORY Past Medical History:  Diagnosis Date  . Benign neoplasm of colon, unspecified   . Carotid artery disease ([place])   . Diverticulosis   . Hyperlipemia   . Hypertension   . Hypertensive kidney disease with CKD stage III   . Lesion of ulnar nerve   . Osteoarthritis    Past Surgical History:  Procedure Laterality Date  . bunions  1990  . CARPOMETACARPAL (CMC) FUSION OF THUMB Left 06/24/2016   Procedure: CARPOMETACARPAL [facility]) ARthroplasty OF THUMB;  Surgeon: Hessie Knows, MD;  Location: [facility];  Service: Orthopedics;  Laterality: Left;  . CATARACT EXTRACTION Bilateral   . EYE SURGERY Bilateral 2014   cataract surgery  . GAS/FLUID EXCHANGE Left 03/02/2018   Procedure: GAS/FLUID EXCHANGE;  Surgeon: Coralyn Pear,  Aaron Edelman, MD;  Location: [place];  Service: Ophthalmology;  Laterality: Left;  . MEMBRANE PEEL Left 03/02/2018   Procedure: MEMBRANE PEEL;  Surgeon: Bernarda Caffey, MD;  Location: [place];  Service: Ophthalmology;  Laterality: Left;  . PARS PLANA VITRECTOMY Left 03/02/2018   Procedure: PARS PLANA VITRECTOMY WITH 25 GAUGE;  Surgeon: Bernarda Caffey, MD;  Location: [place];  Service: Ophthalmology;  Laterality: Left;  . SHOULDER ARTHROSCOPY Right 2014  . TONSILLECTOMY     as a child  . ULNAR NERVE TRANSPOSITION Right      FAMILY HISTORY Family History  Problem Relation Age of Onset  . Breast cancer Mother 39    SOCIAL HISTORY Social History   Tobacco Use  . Smoking status: Never Smoker  . Smokeless tobacco: Never Used  Substance Use Topics  . Alcohol use: Yes    Comment: occasionally mixed drink  . Drug use: No         OPHTHALMIC EXAM:  Base Eye Exam    Visual Acuity (Snellen - Linear)      Right Left   Dist [place] 20/20 -2 20/20 -2       Tonometry (Tonopen, 10:11 AM)      Right Left   Pressure 10 12       Pupils      Dark Light Shape React APD   Right 3 2 Round Brisk None   Left 3 2 Round Brisk None       Visual Fields (Counting fingers)      Left Right    Full Full       Extraocular Movement      Right Left    Full, Ortho Full, Ortho       Neuro/Psych    Oriented x3: Yes   Mood/Affect: Normal       Dilation    Both eyes: 1.0% Mydriacyl, 2.5% Phenylephrine @ 10:11 AM        Slit Lamp and Fundus Exam    Slit Lamp Exam      Right Left   Lids/Lashes Dermatochalasis - upper lid Dermatochalasis - upper lid   Conjunctiva/Sclera White and quiet Temporal Pinguecula, sutures desolved   Cornea 1+ inferior Punctate epithelial erosions, Temporal Well healed cataract wounds 1+Punctate epithelial erosions, Temporal Well healed cataract wounds, Arcus   Anterior Chamber Deep and quiet Deep, clear   Iris Round and dilated Round and dilated   Lens PC IOL in good position PC IOL in good position with open PC   Vitreous Vitreous syneresis, Posterior vitreous detachment post vitrectomy, mild pigment       Fundus Exam      Right Left   Disc Pink and Sharp, Compact Pink and Sharp, Tilted disc, Compact   C/D Ratio 0.5 0.4   Macula Blunted foveal reflex, mild Retinal pigment epithelial mottling, No heme or edema Blunted foveal reflex, trace cystic changes -- improving, no ERM   Vessels Normal Mild Vascular attenuation, mild Tortuousity   Periphery Attahced, Confluent mid-zonal  drusen Attached, confluent mid-zonal drusen, DBH along IT arcade          IMAGING AND PROCEDURES  Imaging and Procedures for _0 @  OCT, Retina - OU - Both Eyes       Right Eye Quality was good. Central Foveal Thickness: 233. Progression has been stable. Findings include normal foveal contour, no IRF, no SRF (PVD).   Left Eye Quality was borderline. Central Foveal Thickness: 339. Progression has improved. Findings include intraretinal fluid, no  SRF, normal foveal contour (Mild Interval improvement in IRF; foveoschisis resolved).   Notes *Images captured and stored on drive  Diagnosis / Impression:  OD: NFP, No IRF/SRF  OS: VMT resolved, mild interval improvement in foveal contour and IRF; resolved foveoschisis  Clinical management:  See below  Abbreviations: NFP - Normal foveal profile. CME - cystoid macular edema. PED - pigment epithelial detachment. IRF - intraretinal fluid. SRF - subretinal fluid. EZ - ellipsoid zone. ERM - epiretinal membrane. ORA - outer retinal atrophy. ORT - outer retinal tubulation. SRHM - subretinal hyper-reflective material                  ASSESSMENT/PLAN:    ICD-10-CM   1. Vitreomacular adhesion of left eye  H43.822   2. Macular retinal cyst of left eye  H35.342   3. Epiretinal membrane (ERM) of left eye  H35.372   4. Retinal edema  H35.81 OCT, Retina - OU - Both Eyes  5. Posterior vitreous detachment of right eye  H43.811   6. Pseudophakia of both eyes  Z96.1   7. PCO (posterior capsular opacification), left  H26.492     1-4. 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              - IOP good at 12 today  - OCT shows VMT resolved, interval improvement in foveal contour and IRF; resolved foveoschisis  - F/U 9-12 months, DFE, OCT  5. PVD / vitreous syneresis  OD  - Discussed findings and prognosis  - No RT or RD on 360 scleral depressed exam  - Reviewed s/s of RT/RD  - Strict return precautions for any such RT/RD signs/symptoms  6. Pseudophakia OU  - s/p CE/IOL OU  - s/p YAG capsulotomy OS 02/08/18  - stable  Ophthalmic Meds Ordered this visit:  No orders of the defined types were placed in this encounter.  Return for f/u 9-12 months, VMT OS, DFE, OCT.  There are no Patient Instructions on file for this visit.  Explained the diagnoses, plan, and follow up with the patient and they expressed understanding.  Patient expressed understanding of the importance of proper follow up care.   Electronically signed by: Estill Bakes, COT 05/07/19 11:02 a.m.  Gardiner Sleeper, M.D., Ph.D. Diseases & Surgery of the Retina and Vitreous [facility] 05/16/19  I have reviewed the above documentation for accuracy and completeness, and I agree with the above. Gardiner Sleeper, M.D., Ph.D. 05/16/19 12:22 AM   Abbreviations: M myopia (nearsighted); A astigmatism; H hyperopia (farsighted); P presbyopia; Mrx spectacle prescription;  CTL contact lenses; OD right eye; OS left eye; OU both eyes  XT exotropia; ET esotropia; PEK punctate epithelial keratitis; PEE punctate epithelial erosions; DES dry eye syndrome; MGD meibomian gland dysfunction; ATs artificial tears; PFAT's preservative free artificial tears; [place] nuclear sclerotic cataract; PSC posterior subcapsular cataract; ERM epi-retinal membrane; PVD posterior vitreous detachment; RD retinal detachment; DM diabetes mellitus; DR diabetic retinopathy; NPDR non-proliferative diabetic retinopathy; PDR proliferative diabetic retinopathy; CSME clinically significant macular edema; DME diabetic macular edema; dbh dot blot hemorrhages; CWS cotton wool spot; POAG primary open angle glaucoma; C/D cup-to-disc ratio; HVF humphrey visual field; GVF goldmann visual field; OCT optical coherence tomography;  IOP  intraocular pressure; BRVO Branch retinal vein occlusion; CRVO central retinal vein occlusion; CRAO central retinal artery occlusion; BRAO branch retinal artery occlusion; RT retinal tear; SB scleral buckle; PPV pars plana vitrectomy; VH Vitreous hemorrhage; PRP panretinal laser photocoagulation; IVK intravitreal kenalog; VMT vitreomacular traction; MH Macular hole;  NVD neovascularization of the disc; NVE neovascularization elsewhere; AREDS age related eye disease study; ARMD age related macular degeneration; POAG primary open angle glaucoma; EBMD epithelial/anterior basement membrane dystrophy; ACIOL anterior chamber intraocular lens; IOL intraocular lens; PCIOL posterior chamber intraocular lens; Phaco/IOL phacoemulsification with intraocular lens placement; [place] photorefractive keratectomy; LASIK laser assisted in situ keratomileusis; HTN hypertension; DM diabetes mellitus; COPD chronic obstructive pulmonary disease

## 2019-05-15 ENCOUNTER — Ambulatory Visit (INDEPENDENT_AMBULATORY_CARE_PROVIDER_SITE_OTHER): Payer: Medicare Other | Admitting: Ophthalmology

## 2019-05-15 ENCOUNTER — Encounter (INDEPENDENT_AMBULATORY_CARE_PROVIDER_SITE_OTHER): Payer: Self-pay | Admitting: Ophthalmology

## 2019-05-15 ENCOUNTER — Other Ambulatory Visit: Payer: Self-pay

## 2019-05-15 DIAGNOSIS — H43811 Vitreous degeneration, right eye: Secondary | ICD-10-CM

## 2019-05-15 DIAGNOSIS — H35372 Puckering of macula, left eye: Secondary | ICD-10-CM | POA: Diagnosis not present

## 2019-05-15 DIAGNOSIS — H35342 Macular cyst, hole, or pseudohole, left eye: Secondary | ICD-10-CM

## 2019-05-15 DIAGNOSIS — H3581 Retinal edema: Secondary | ICD-10-CM

## 2019-05-15 DIAGNOSIS — H43822 Vitreomacular adhesion, left eye: Secondary | ICD-10-CM

## 2019-05-15 DIAGNOSIS — Z961 Presence of intraocular lens: Secondary | ICD-10-CM

## 2019-05-16 ENCOUNTER — Encounter (INDEPENDENT_AMBULATORY_CARE_PROVIDER_SITE_OTHER): Payer: Self-pay | Admitting: Ophthalmology

## 2019-12-28 ENCOUNTER — Other Ambulatory Visit: Payer: Self-pay | Admitting: Internal Medicine

## 2019-12-28 DIAGNOSIS — Z1231 Encounter for screening mammogram for malignant neoplasm of breast: Secondary | ICD-10-CM

## 2020-02-06 ENCOUNTER — Ambulatory Visit
Admission: RE | Admit: 2020-02-06 | Discharge: 2020-02-06 | Disposition: A | Discharge status: Home / Self Care | Payer: Medicare Other | Source: Ambulatory Visit | Attending: Internal Medicine | Admitting: Internal Medicine

## 2020-02-06 DIAGNOSIS — Z1231 Encounter for screening mammogram for malignant neoplasm of breast: Secondary | ICD-10-CM | POA: Insufficient documentation

## 2020-05-13 NOTE — Progress Notes (Signed)
[facility] Note  05/14/2020   CHIEF COMPLAINT Patient presents for Retina Follow Up  HISTORY OF PRESENT ILLNESS: Hannah Hooper is a 86 y.o. female who presents to the clinic today for:  HPI    Retina Follow Up    Patient presents with  Other.  In left eye.  This started 1 year ago.  I, the attending physician,  performed the HPI with the patient and updated documentation appropriately.       Last edited by Bernarda Caffey, MD on 05/14/2020  1:32 PM. (History)    Pt states no change in vision or overall health since she was here last, she still follows with Dr. Marvel Plan, she wears glasses when reading only   Referring physician: [facility] [place],  [place] [address]  HISTORICAL INFORMATION:   Selected notes from the [place] Referred by Dr. Marvel Plan for concern of mac hole OS    CURRENT MEDICATIONS: Current Outpatient Medications (Ophthalmic Drugs)  Medication Sig  . prednisoLONE acetate (PRED FORTE) 1 % ophthalmic suspension Place 1 drop into the left eye 4 (four) times daily. (Patient not taking: Reported on 05/15/2019)  . Tetrahydrozoline HCl (VISINE OP) Place 1 drop into both eyes daily as needed (for burning).    No current facility-administered medications for this visit. (Ophthalmic Drugs)   Current Outpatient Medications (Other)  Medication Sig  . amLODipine (NORVASC) 5 MG tablet Take 5 mg by mouth daily.  Marland Kitchen HYDROcodone-acetaminophen (NORCO) 5-325 MG tablet Take 1 tablet by mouth every 6 (six) hours as needed for moderate pain.  Marland Kitchen lisinopril-hydrochlorothiazide (PRINZIDE,ZESTORETIC) 20-12.5 MG tablet Take 1 tablet by mouth daily.  . pravastatin (PRAVACHOL) 80 MG tablet Take 80 mg by mouth at bedtime.  . sulfaSALAzine (AZULFIDINE) 500 MG tablet Take 1,000 mg by mouth daily.   No current facility-administered medications for this visit. (Other)      REVIEW OF SYSTEMS: ROS    Positive for: Eyes    Negative for: Constitutional, Gastrointestinal, Neurological, Skin, Genitourinary, Musculoskeletal, HENT, Endocrine, Cardiovascular, Respiratory, Psychiatric, Allergic/Imm, Heme/Lymph   Last edited by Theodore Demark, COA on 05/14/2020  1:26 PM. (History)       ALLERGIES No Known Allergies  PAST MEDICAL HISTORY Past Medical History:  Diagnosis Date  . Benign neoplasm of colon, unspecified   . Carotid artery disease ([place])   . Diverticulosis   . Hyperlipemia   . Hypertension   . Hypertensive kidney disease with CKD stage III ([place])   . Lesion of ulnar nerve   . Osteoarthritis    Past Surgical History:  Procedure Laterality Date  . bunions  1990  . CARPOMETACARPAL (CMC) FUSION OF THUMB Left 06/24/2016   Procedure: CARPOMETACARPAL [facility]) ARthroplasty OF THUMB;  Surgeon: Hessie Knows, MD;  Location: [facility];  Service: Orthopedics;  Laterality: Left;  . CATARACT EXTRACTION Bilateral   . EYE SURGERY Bilateral 2014   cataract surgery  . GAS/FLUID EXCHANGE Left 03/02/2018   Procedure: GAS/FLUID EXCHANGE;  Surgeon: Bernarda Caffey, MD;  Location: [place];  Service: Ophthalmology;  Laterality: Left;  . MEMBRANE PEEL Left 03/02/2018   Procedure: MEMBRANE PEEL;  Surgeon: Bernarda Caffey, MD;  Location: [place];  Service: Ophthalmology;  Laterality: Left;  . PARS PLANA VITRECTOMY Left 03/02/2018   Procedure: PARS PLANA VITRECTOMY WITH 25 GAUGE;  Surgeon: Bernarda Caffey, MD;  Location: [place];  Service: Ophthalmology;  Laterality: Left;  . SHOULDER ARTHROSCOPY Right 2014  . TONSILLECTOMY  as a child  . ULNAR NERVE TRANSPOSITION Right     FAMILY HISTORY Family History  Problem Relation Age of Onset  . Breast cancer Mother 71    SOCIAL HISTORY Social History   Tobacco Use  . Smoking status: Never Smoker  . Smokeless tobacco: Never Used  Vaping Use  . Vaping Use: Never used  Substance Use Topics  . Alcohol use: Yes    Comment: occasionally mixed drink  . Drug use: No          OPHTHALMIC EXAM:  Base Eye Exam    Visual Acuity (Snellen - Linear)      Right Left   Dist [place] 20/30 -2 20/30 -2   Dist ph [place] 20/20 -1 20/20 -2       Tonometry (Tonopen, 1:23 PM)      Right Left   Pressure 09 08       Pupils      Dark Light Shape React APD   Right 3 2 Round Brisk None   Left 3 2 Round Brisk None       Visual Fields (Counting fingers)      Left Right    Full Full       Extraocular Movement      Right Left    Full, Ortho Full, Ortho       Neuro/Psych    Oriented x3: Yes   Mood/Affect: Normal       Dilation    Both eyes: 1.0% Mydriacyl, 2.5% Phenylephrine @ 1:23 PM        Slit Lamp and Fundus Exam    Slit Lamp Exam      Right Left   Lids/Lashes Dermatochalasis - upper lid Dermatochalasis - upper lid   Conjunctiva/Sclera White and quiet Temporal Pinguecula, sutures desolved   Cornea 1-2+ inferior Punctate epithelial erosions, Temporal Well healed cataract wounds Trace Punctate epithelial erosions, Temporal Well healed cataract wounds, Arcus   Anterior Chamber Deep and quiet Deep, clear   Iris Round and dilated Round and dilated   Lens PC IOL in good position, trace, non-central Posterior capsular opacification PC IOL in good position with open PC   Vitreous mild Vitreous syneresis, Posterior vitreous detachment post vitrectomy       Fundus Exam      Right Left   Disc Pink and Sharp, Compact Pink and Sharp, Tilted disc, Compact, mild Peripapillary atrophy   C/D Ratio 0.4 0.4   Macula Flat, good foveal reflex, mild Retinal pigment epithelial mottling, No heme or edema Flat, Blunted foveal reflex, stable resolution of trace cystic changes, no ERM   Vessels mild attenuation, mild tortuousity Mild Vascular attenuation, mild Tortuousity   Periphery Attahced, Confluent mid-zonal drusen Attached, confluent mid-zonal drusen          IMAGING AND PROCEDURES  Imaging and Procedures for _0 @  OCT, Retina - OU - Both Eyes       Right  Eye Quality was good. Central Foveal Thickness: 237. Progression has been stable. Findings include normal foveal contour, no IRF, no SRF (Partial PVD).   Left Eye Quality was borderline. Central Foveal Thickness: 332. Progression has been stable. Findings include no SRF, normal foveal contour, no IRF (Stable resolution of foveoschisis and cystic changes).   Notes *Images captured and stored on drive  Diagnosis / Impression:  OD: NFP, No IRF/SRF  OS: VMT resolved, Stable resolution of foveoschisis and cystic changes  Clinical management:  See below  Abbreviations: NFP - Normal foveal  profile. CME - cystoid macular edema. PED - pigment epithelial detachment. IRF - intraretinal fluid. SRF - subretinal fluid. EZ - ellipsoid zone. ERM - epiretinal membrane. ORA - outer retinal atrophy. ORT - outer retinal tubulation. SRHM - subretinal hyper-reflective material                  ASSESSMENT/PLAN:    ICD-10-CM   1. Vitreomacular adhesion of left eye  H43.822   2. Macular retinal cyst of left eye  H35.342   3. Epiretinal membrane (ERM) of left eye  H35.372   4. Retinal edema  H35.81 OCT, Retina - OU - Both Eyes  5. Posterior vitreous detachment of right eye  H43.811   6. Pseudophakia of both eyes  Z96.1     1-4. 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 PVD / vitreous syneresis OD  - Discussed findings and prognosis  - No RT or RD on 360 scleral depressed exam  - Reviewed s/s of RT/RD  - strict return precautions for any such RT/RD symptoms  6. Pseudophakia OU  - s/p CE/IOL OU  - s/p YAG capsulotomy OS 02/08/18  - stable  Ophthalmic Meds Ordered  this visit:  No orders of the defined types were placed in this encounter.  Return in about 1 year (around 05/14/2021) for f/u VMT OS, DFE, OCT.  There are no Patient Instructions on file for this visit.  This document serves as a record of services personally performed by Gardiner Sleeper, MD, PhD. It was created on their behalf by Roselee Nova, COMT. The creation of this record is the provider's dictation and/or activities during the visit.  Electronically signed by: Roselee Nova, COMT 05/15/20 9:36 AM  This document serves as a record of services personally performed by Gardiner Sleeper, MD, PhD. It was created on their behalf by San Jetty. Owens Shark, OA an ophthalmic technician. The creation of this record is the provider's dictation and/or activities during the visit.    Electronically signed by: San Jetty. Owens Shark, [place] 10.27.2021 9:36 AM  Gardiner Sleeper, M.D., Ph.D. Diseases & Surgery of the Retina and Vitreous [facility]  I have reviewed the above documentation for accuracy and completeness, and I agree with the above. Gardiner Sleeper, M.D., Ph.D. 05/15/20 9:36 AM   Abbreviations: M myopia (nearsighted); A astigmatism; H hyperopia (farsighted); P presbyopia; Mrx spectacle prescription;  CTL contact lenses; OD right eye; OS left eye; OU both eyes  XT exotropia; ET esotropia; PEK punctate epithelial keratitis; PEE punctate epithelial erosions; DES dry eye syndrome; MGD meibomian gland dysfunction; ATs artificial tears; PFAT's preservative free artificial tears; [place] nuclear sclerotic cataract; PSC posterior subcapsular cataract; ERM epi-retinal membrane; PVD posterior vitreous detachment; RD retinal detachment; DM diabetes mellitus; DR diabetic retinopathy; NPDR non-proliferative diabetic retinopathy; PDR proliferative diabetic retinopathy; CSME clinically significant macular edema; DME diabetic macular edema; dbh dot blot hemorrhages; CWS cotton wool spot; POAG primary open  angle glaucoma; C/D cup-to-disc ratio; HVF humphrey visual field;  GVF goldmann visual field; OCT optical coherence tomography; IOP intraocular pressure; BRVO Branch retinal vein occlusion; CRVO central retinal vein occlusion; CRAO central retinal artery occlusion; BRAO branch retinal artery occlusion; RT retinal tear; SB scleral buckle; PPV pars plana vitrectomy; VH Vitreous hemorrhage; PRP panretinal laser photocoagulation; IVK intravitreal kenalog; VMT vitreomacular traction; MH Macular hole;  NVD neovascularization of the disc; NVE neovascularization elsewhere; AREDS age related eye disease study; ARMD age related macular degeneration; POAG primary open angle glaucoma; EBMD epithelial/anterior basement membrane dystrophy; ACIOL anterior chamber intraocular lens; IOL intraocular lens; PCIOL posterior chamber intraocular lens; Phaco/IOL phacoemulsification with intraocular lens placement; [place] photorefractive keratectomy; LASIK laser assisted in situ keratomileusis; HTN hypertension; DM diabetes mellitus; COPD chronic obstructive pulmonary disease

## 2020-05-14 ENCOUNTER — Other Ambulatory Visit: Payer: Self-pay

## 2020-05-14 ENCOUNTER — Ambulatory Visit (INDEPENDENT_AMBULATORY_CARE_PROVIDER_SITE_OTHER): Payer: Medicare Other | Admitting: Ophthalmology

## 2020-05-14 ENCOUNTER — Encounter (INDEPENDENT_AMBULATORY_CARE_PROVIDER_SITE_OTHER): Payer: Self-pay | Admitting: Ophthalmology

## 2020-05-14 DIAGNOSIS — H3581 Retinal edema: Secondary | ICD-10-CM

## 2020-05-14 DIAGNOSIS — H43811 Vitreous degeneration, right eye: Secondary | ICD-10-CM

## 2020-05-14 DIAGNOSIS — H43822 Vitreomacular adhesion, left eye: Secondary | ICD-10-CM | POA: Diagnosis not present

## 2020-05-14 DIAGNOSIS — H35342 Macular cyst, hole, or pseudohole, left eye: Secondary | ICD-10-CM

## 2020-05-14 DIAGNOSIS — H35372 Puckering of macula, left eye: Secondary | ICD-10-CM | POA: Diagnosis not present

## 2020-05-14 DIAGNOSIS — Z961 Presence of intraocular lens: Secondary | ICD-10-CM

## 2021-01-09 ENCOUNTER — Other Ambulatory Visit: Payer: Self-pay | Admitting: Internal Medicine

## 2021-01-09 DIAGNOSIS — Z1231 Encounter for screening mammogram for malignant neoplasm of breast: Secondary | ICD-10-CM

## 2021-02-06 ENCOUNTER — Inpatient Hospital Stay: Admission: RE | Admit: 2021-02-06 | Payer: PRIVATE HEALTH INSURANCE | Source: Ambulatory Visit

## 2021-02-17 ENCOUNTER — Ambulatory Visit
Admission: RE | Admit: 2021-02-17 | Discharge: 2021-02-17 | Disposition: A | Discharge status: Home / Self Care | Payer: Medicare Other | Source: Ambulatory Visit | Attending: Internal Medicine | Admitting: Internal Medicine

## 2021-02-17 ENCOUNTER — Other Ambulatory Visit: Payer: Self-pay

## 2021-02-17 DIAGNOSIS — Z1231 Encounter for screening mammogram for malignant neoplasm of breast: Secondary | ICD-10-CM | POA: Diagnosis not present

## 2021-05-11 NOTE — Progress Notes (Shared)
[facility] Note  05/15/2021   CHIEF COMPLAINT Patient presents for No chief complaint on file.  HISTORY OF PRESENT ILLNESS: Hannah Hooper is a 87 y.o. female who presents to the clinic today for:   Pt states no change in vision or overall health since she was here last, she still follows with Dr. Marvel Plan, she wears glasses when reading only   Referring physician: Kirk Ruths, MD [facility] [place],  [place] [address]  HISTORICAL INFORMATION:   Selected notes from the [place] Referred by Dr. Marvel Plan for concern of mac hole OS    CURRENT MEDICATIONS: Current Outpatient Medications (Ophthalmic Drugs)  Medication Sig   prednisoLONE acetate (PRED FORTE) 1 % ophthalmic suspension Place 1 drop into the left eye 4 (four) times daily. (Patient not taking: Reported on 05/15/2019)   Tetrahydrozoline HCl (VISINE OP) Place 1 drop into both eyes daily as needed (for burning).    No current facility-administered medications for this visit. (Ophthalmic Drugs)   Current Outpatient Medications (Other)  Medication Sig   amLODipine (NORVASC) 5 MG tablet Take 5 mg by mouth daily.   HYDROcodone-acetaminophen (NORCO) 5-325 MG tablet Take 1 tablet by mouth every 6 (six) hours as needed for moderate pain.   lisinopril-hydrochlorothiazide (PRINZIDE,ZESTORETIC) 20-12.5 MG tablet Take 1 tablet by mouth daily.   pravastatin (PRAVACHOL) 80 MG tablet Take 80 mg by mouth at bedtime.   sulfaSALAzine (AZULFIDINE) 500 MG tablet Take 1,000 mg by mouth daily.   No current facility-administered medications for this visit. (Other)      REVIEW OF SYSTEMS:     ALLERGIES No Known Allergies  PAST MEDICAL HISTORY Past Medical History:  Diagnosis Date   Benign neoplasm of colon, unspecified    Carotid artery disease ([place])    Diverticulosis    Hyperlipemia    Hypertension    Hypertensive kidney disease with CKD stage  III (HCC)    Lesion of ulnar nerve    Osteoarthritis    Past Surgical History:  Procedure Laterality Date   bunions  1990   CARPOMETACARPAL ([place]) FUSION OF THUMB Left 06/24/2016   Procedure: CARPOMETACARPAL [facility]) ARthroplasty OF THUMB;  Surgeon: Hessie Knows, MD;  Location: [facility];  Service: Orthopedics;  Laterality: Left;   CATARACT EXTRACTION Bilateral    EYE SURGERY Bilateral 2014   cataract surgery   GAS/FLUID EXCHANGE Left 03/02/2018   Procedure: GAS/FLUID EXCHANGE;  Surgeon: Bernarda Caffey, MD;  Location: [place];  Service: Ophthalmology;  Laterality: Left;   MEMBRANE PEEL Left 03/02/2018   Procedure: MEMBRANE PEEL;  Surgeon: Bernarda Caffey, MD;  Location: [place];  Service: Ophthalmology;  Laterality: Left;   PARS PLANA VITRECTOMY Left 03/02/2018   Procedure: PARS PLANA VITRECTOMY WITH 25 GAUGE;  Surgeon: Bernarda Caffey, MD;  Location: [place];  Service: Ophthalmology;  Laterality: Left;   SHOULDER ARTHROSCOPY Right 2014   TONSILLECTOMY     as a child   ULNAR NERVE TRANSPOSITION Right     FAMILY HISTORY Family History  Problem Relation Age of Onset   Breast cancer Mother 52    SOCIAL HISTORY Social History   Tobacco Use   Smoking status: Never   Smokeless tobacco: Never  Vaping Use   Vaping Use: Never used  Substance Use Topics   Alcohol use: Yes    Comment: occasionally mixed drink   Drug use: No         OPHTHALMIC EXAM:  Not recorded     IMAGING AND PROCEDURES  Imaging and Procedures for _0 @            ASSESSMENT/PLAN:    ICD-10-CM   1. Vitreomacular adhesion of left eye  H43.822     2. Macular retinal cyst of left eye  H35.342     3. Epiretinal membrane (ERM) of left eye  H35.372     4. Retinal edema  H35.81     5. Posterior vitreous detachment of right eye  H43.811     6. Pseudophakia of both eyes  Z96.1        1-4. 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 PVD / vitreous syneresis OD  - Discussed findings and prognosis  - No RT or RD on 360 scleral depressed exam  - Reviewed s/s of RT/RD  - strict return precautions for any such RT/RD symptoms  6. Pseudophakia OU  - s/p CE/IOL OU  - s/p YAG capsulotomy OS 02/08/18  - stable  Ophthalmic Meds Ordered this visit:  No orders of the defined types were placed in this encounter.  No follow-ups on file.  There are no Patient Instructions on file for this visit.  This document serves as a record of services personally performed by Gardiner Sleeper, MD, PhD. It was created on their behalf by Leonie Douglas, an ophthalmic technician. The creation of this record is the provider's dictation and/or activities during the visit.    Electronically signed by: Leonie Douglas COA, 05/11/21  12:14 PM   Gardiner Sleeper, M.D., Ph.D. Diseases & Surgery of the Retina and [place] 05/15/2021   Abbreviations: M myopia (nearsighted); A astigmatism; H hyperopia (farsighted); P presbyopia; Mrx spectacle prescription;  CTL contact lenses; OD right eye; OS left eye; OU both eyes  XT exotropia; ET esotropia; PEK punctate epithelial keratitis; PEE punctate epithelial erosions; DES dry eye syndrome; MGD meibomian gland dysfunction; ATs artificial tears; PFAT's preservative free artificial tears; [place] nuclear sclerotic cataract; PSC posterior subcapsular cataract; ERM epi-retinal membrane; PVD posterior vitreous detachment; RD retinal detachment; DM diabetes mellitus; DR diabetic retinopathy; NPDR non-proliferative diabetic retinopathy; PDR proliferative diabetic retinopathy; CSME clinically significant macular edema; DME diabetic  macular edema; dbh dot blot hemorrhages; CWS cotton wool spot; POAG primary open angle glaucoma; C/D cup-to-disc ratio; HVF humphrey visual field; GVF goldmann visual field; OCT optical coherence tomography; IOP intraocular pressure; BRVO Branch retinal vein occlusion; CRVO central retinal vein occlusion; CRAO central retinal artery occlusion; BRAO branch retinal artery occlusion; RT retinal tear; SB scleral buckle; PPV pars plana vitrectomy; VH Vitreous hemorrhage; PRP panretinal laser photocoagulation; IVK intravitreal kenalog; VMT vitreomacular traction; MH Macular hole;  NVD neovascularization of the disc; NVE neovascularization elsewhere; AREDS age related eye disease study; ARMD age related macular degeneration; POAG primary open angle glaucoma; EBMD epithelial/anterior basement membrane dystrophy; ACIOL anterior chamber intraocular lens; IOL intraocular lens; PCIOL posterior chamber intraocular lens; Phaco/IOL phacoemulsification with intraocular lens placement; PRK photorefractive keratectomy; LASIK laser  assisted in situ keratomileusis; HTN hypertension; DM diabetes mellitus; COPD chronic obstructive pulmonary disease

## 2021-05-15 ENCOUNTER — Encounter (INDEPENDENT_AMBULATORY_CARE_PROVIDER_SITE_OTHER): Payer: Medicare Other | Admitting: Ophthalmology

## 2021-05-15 DIAGNOSIS — H43811 Vitreous degeneration, right eye: Secondary | ICD-10-CM

## 2021-05-15 DIAGNOSIS — H35372 Puckering of macula, left eye: Secondary | ICD-10-CM

## 2021-05-15 DIAGNOSIS — Z961 Presence of intraocular lens: Secondary | ICD-10-CM

## 2021-05-15 DIAGNOSIS — H43822 Vitreomacular adhesion, left eye: Secondary | ICD-10-CM

## 2021-05-15 DIAGNOSIS — H3581 Retinal edema: Secondary | ICD-10-CM

## 2021-05-15 DIAGNOSIS — H35342 Macular cyst, hole, or pseudohole, left eye: Secondary | ICD-10-CM

## 2021-05-18 NOTE — Progress Notes (Shared)
[facility] Note  05/20/2021   CHIEF COMPLAINT Patient presents for No chief complaint on file.  HISTORY OF PRESENT ILLNESS: Hannah Hooper is a 87 y.o. female who presents to the clinic today for:     Referring physician: Kirk Ruths, MD [place] [facility] [place],  [place] [id number]  HISTORICAL INFORMATION:   Selected notes from the MEDICAL RECORD NUMBER Referred by Dr. Marvel Plan for concern of mac hole OS    CURRENT MEDICATIONS: Current Outpatient Medications (Ophthalmic Drugs)  Medication Sig   prednisoLONE acetate (PRED FORTE) 1 % ophthalmic suspension Place 1 drop into the left eye 4 (four) times daily. (Patient not taking: Reported on 05/15/2019)   Tetrahydrozoline HCl (VISINE OP) Place 1 drop into both eyes daily as needed (for burning).    No current facility-administered medications for this visit. (Ophthalmic Drugs)   Current Outpatient Medications (Other)  Medication Sig   amLODipine (NORVASC) 5 MG tablet Take 5 mg by mouth daily.   HYDROcodone-acetaminophen (NORCO) 5-325 MG tablet Take 1 tablet by mouth every 6 (six) hours as needed for moderate pain.   lisinopril-hydrochlorothiazide (PRINZIDE,ZESTORETIC) 20-12.5 MG tablet Take 1 tablet by mouth daily.   pravastatin (PRAVACHOL) 80 MG tablet Take 80 mg by mouth at bedtime.   sulfaSALAzine (AZULFIDINE) 500 MG tablet Take 1,000 mg by mouth daily.   No current facility-administered medications for this visit. (Other)      REVIEW OF SYSTEMS:     ALLERGIES No Known Allergies  PAST MEDICAL HISTORY Past Medical History:  Diagnosis Date   Benign neoplasm of colon, unspecified    Carotid artery disease ([place])    Diverticulosis    Hyperlipemia    Hypertension    Hypertensive kidney disease with CKD stage III (HCC)    Lesion of ulnar nerve    Osteoarthritis    Past Surgical History:  Procedure Laterality Date   bunions  1990   CARPOMETACARPAL  ([place]) FUSION OF THUMB Left 06/24/2016   Procedure: CARPOMETACARPAL [facility]) ARthroplasty OF THUMB;  Surgeon: Hessie Knows, MD;  Location: [facility];  Service: Orthopedics;  Laterality: Left;   CATARACT EXTRACTION Bilateral    EYE SURGERY Bilateral 2014   cataract surgery   GAS/FLUID EXCHANGE Left 03/02/2018   Procedure: GAS/FLUID EXCHANGE;  Surgeon: Bernarda Caffey, MD;  Location: [place];  Service: Ophthalmology;  Laterality: Left;   MEMBRANE PEEL Left 03/02/2018   Procedure: MEMBRANE PEEL;  Surgeon: Bernarda Caffey, MD;  Location: [place];  Service: Ophthalmology;  Laterality: Left;   PARS PLANA VITRECTOMY Left 03/02/2018   Procedure: PARS PLANA VITRECTOMY WITH 25 GAUGE;  Surgeon: Bernarda Caffey, MD;  Location: [place];  Service: Ophthalmology;  Laterality: Left;   SHOULDER ARTHROSCOPY Right 2014   TONSILLECTOMY     as a child   ULNAR NERVE TRANSPOSITION Right     FAMILY HISTORY Family History  Problem Relation Age of Onset   Breast cancer Mother 47    SOCIAL HISTORY Social History   Tobacco Use   Smoking status: Never   Smokeless tobacco: Never  Vaping Use   Vaping Use: Never used  Substance Use Topics   Alcohol use: Yes    Comment: occasionally mixed drink   Drug use: No         OPHTHALMIC EXAM:  Not recorded     IMAGING AND PROCEDURES  Imaging and Procedures for _0 @  ASSESSMENT/PLAN:    ICD-10-CM   1. Vitreomacular adhesion of left eye  H43.822     2. Retinal edema  H35.81     3. Macular retinal cyst of left eye  H35.342     4. Epiretinal membrane (ERM) of left eye  H35.372     5. Posterior vitreous detachment of right eye  H43.811     6. Pseudophakia of both eyes  Z96.1        1-4. 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 PVD / vitreous syneresis OD  - Discussed findings and prognosis  - No RT or RD on 360 scleral depressed exam  - Reviewed s/s of RT/RD  - strict return precautions for any such RT/RD symptoms  6. Pseudophakia OU  - s/p CE/IOL OU  - s/p YAG capsulotomy OS 02/08/18  - stable  Ophthalmic Meds Ordered this visit:  No orders of the defined types were placed in this encounter.  No follow-ups on file.  There are no Patient Instructions on file for this visit.   This document serves as a record of services personally performed by Gardiner Sleeper, MD, PhD. It was created on their behalf by Orvan Falconer, an ophthalmic technician. The creation of this record is the provider's dictation and/or activities during the visit.    Electronically signed by: Orvan Falconer, OA, 05/18/21  10:46 AM   Gardiner Sleeper, M.D., Ph.D. Diseases & Surgery of the Retina and Vitreous [facility]  I have reviewed the above documentation for accuracy and completeness, and I agree with the above. Gardiner Sleeper, M.D., Ph.D. 05/15/20 10:46 AM   Abbreviations: M myopia (nearsighted); A astigmatism; H hyperopia (farsighted); P presbyopia; Mrx spectacle prescription;  CTL contact lenses; OD right eye; OS left eye; OU both eyes  XT exotropia; ET esotropia; PEK punctate epithelial keratitis; PEE punctate epithelial erosions; DES dry eye syndrome; MGD meibomian gland dysfunction; ATs artificial tears; PFAT's preservative free artificial tears; [place] nuclear sclerotic cataract; PSC posterior subcapsular cataract; ERM epi-retinal membrane; PVD posterior vitreous detachment; RD retinal detachment; DM diabetes mellitus; DR diabetic retinopathy; NPDR non-proliferative diabetic retinopathy; PDR proliferative diabetic retinopathy; CSME clinically significant macular edema; DME diabetic macular  edema; dbh dot blot hemorrhages; CWS cotton wool spot; POAG primary open angle glaucoma; C/D cup-to-disc ratio; HVF humphrey visual field; GVF goldmann visual field; OCT optical coherence tomography; IOP intraocular pressure; BRVO Branch retinal vein occlusion; CRVO central retinal vein occlusion; CRAO central retinal artery occlusion; BRAO branch retinal artery occlusion; RT retinal tear; SB scleral buckle; PPV pars plana vitrectomy; VH Vitreous hemorrhage; PRP panretinal laser photocoagulation; IVK intravitreal kenalog; VMT vitreomacular traction; MH Macular hole;  NVD neovascularization of the disc; NVE neovascularization elsewhere; AREDS age related eye disease study; ARMD age related macular degeneration; POAG primary open angle glaucoma; EBMD epithelial/anterior basement membrane dystrophy; ACIOL anterior chamber intraocular lens; IOL intraocular lens; PCIOL posterior chamber intraocular lens; Phaco/IOL phacoemulsification with intraocular lens placement; [place] photorefractive keratectomy; LASIK laser assisted in situ keratomileusis; HTN hypertension; DM diabetes mellitus; COPD chronic obstructive  pulmonary disease

## 2021-05-19 ENCOUNTER — Ambulatory Visit (INDEPENDENT_AMBULATORY_CARE_PROVIDER_SITE_OTHER): Payer: Medicare Other | Admitting: Ophthalmology

## 2021-05-19 ENCOUNTER — Encounter (INDEPENDENT_AMBULATORY_CARE_PROVIDER_SITE_OTHER): Payer: Self-pay | Admitting: Ophthalmology

## 2021-05-19 ENCOUNTER — Other Ambulatory Visit: Payer: Self-pay

## 2021-05-19 DIAGNOSIS — H43811 Vitreous degeneration, right eye: Secondary | ICD-10-CM | POA: Diagnosis not present

## 2021-05-19 DIAGNOSIS — H35372 Puckering of macula, left eye: Secondary | ICD-10-CM

## 2021-05-19 DIAGNOSIS — H26492 Other secondary cataract, left eye: Secondary | ICD-10-CM

## 2021-05-19 DIAGNOSIS — H35342 Macular cyst, hole, or pseudohole, left eye: Secondary | ICD-10-CM | POA: Diagnosis not present

## 2021-05-19 DIAGNOSIS — H3581 Retinal edema: Secondary | ICD-10-CM

## 2021-05-19 DIAGNOSIS — H43822 Vitreomacular adhesion, left eye: Secondary | ICD-10-CM | POA: Diagnosis not present

## 2021-05-19 DIAGNOSIS — Z961 Presence of intraocular lens: Secondary | ICD-10-CM

## 2021-05-19 NOTE — Progress Notes (Addendum)
[facility] Note  05/19/2021   CHIEF COMPLAINT Patient presents for Retina Follow Up  HISTORY OF PRESENT ILLNESS: Hannah Hooper is a 87 y.o. female who presents to the clinic today for:  HPI     Retina Follow Up   Patient presents with  Other.  In left eye.  This started 1 year ago.  I, the attending physician,  performed the HPI with the patient and updated documentation appropriately.        Comments   Patient here for 1 year retina follow up for VMT OS. Patient states vision not having any problems. No eye pain.       Last edited by Bernarda Caffey, MD on 05/22/2021  1:10 AM.    Pt states no change in vision or overall health since she was here last, she still follows with Dr. Marvel Plan, she wears glasses when reading only  Referring physician: [facility] [address] [place],  [place] [id number]  HISTORICAL INFORMATION:   Selected notes from the [facility] Referred by Dr. Marvel Plan for concern of mac hole OS    CURRENT MEDICATIONS: Current Outpatient Medications (Ophthalmic Drugs)  Medication Sig   prednisoLONE acetate (PRED FORTE) 1 % ophthalmic suspension Place 1 drop into the left eye 4 (four) times daily. (Patient not taking: Reported on 05/15/2019)   Tetrahydrozoline HCl (VISINE OP) Place 1 drop into both eyes daily as needed (for burning).    No current facility-administered medications for this visit. (Ophthalmic Drugs)   Current Outpatient Medications (Other)  Medication Sig   amLODipine (NORVASC) 5 MG tablet Take 5 mg by mouth daily.   HYDROcodone-acetaminophen (NORCO) 5-325 MG tablet Take 1 tablet by mouth every 6 (six) hours as needed for moderate pain.   lisinopril-hydrochlorothiazide (PRINZIDE,ZESTORETIC) 20-12.5 MG tablet Take 1 tablet by mouth daily.   pravastatin (PRAVACHOL) 80 MG tablet Take 80 mg by mouth at bedtime.   sulfaSALAzine (AZULFIDINE) 500 MG tablet Take 1,000 mg by mouth daily.   No current  facility-administered medications for this visit. (Other)   REVIEW OF SYSTEMS: ROS   Positive for: Eyes Negative for: Constitutional, Gastrointestinal, Neurological, Skin, Genitourinary, Musculoskeletal, HENT, Endocrine, Cardiovascular, Respiratory, Psychiatric, Allergic/Imm, Heme/Lymph Last edited by Theodore Demark, COA on 05/19/2021  2:00 PM.     ALLERGIES No Known Allergies  PAST MEDICAL HISTORY Past Medical History:  Diagnosis Date   Benign neoplasm of colon, unspecified    Carotid artery disease ([place])    Diverticulosis    Hyperlipemia    Hypertension    Hypertensive kidney disease with CKD stage III ([place])    Lesion of ulnar nerve    Osteoarthritis    Past Surgical History:  Procedure Laterality Date   bunions  1990   CARPOMETACARPAL ([place]) FUSION OF THUMB Left 06/24/2016   Procedure: CARPOMETACARPAL [facility]) ARthroplasty OF THUMB;  Surgeon: Hessie Knows, MD;  Location: [facility] ORS;  Service: Orthopedics;  Laterality: Left;   CATARACT EXTRACTION Bilateral    EYE SURGERY Bilateral 2014   cataract surgery   GAS/FLUID EXCHANGE Left 03/02/2018   Procedure: GAS/FLUID EXCHANGE;  Surgeon: Bernarda Caffey, MD;  Location: [facility];  Service: Ophthalmology;  Laterality: Left;   MEMBRANE PEEL Left 03/02/2018   Procedure: MEMBRANE PEEL;  Surgeon: Bernarda Caffey, MD;  Location: [facility];  Service: Ophthalmology;  Laterality: Left;   PARS PLANA VITRECTOMY Left 03/02/2018   Procedure: PARS PLANA VITRECTOMY WITH 25 GAUGE;  Surgeon: Bernarda Caffey, MD;  [facility]  OR;  Service: Ophthalmology;  Laterality: Left;   SHOULDER ARTHROSCOPY Right 2014   TONSILLECTOMY     as a child   ULNAR NERVE TRANSPOSITION Right     FAMILY HISTORY Family History  Problem Relation Age of Onset   Breast cancer Mother 65    SOCIAL HISTORY Social History   Tobacco Use   Smoking status: Never   Smokeless tobacco: Never  Vaping Use   Vaping Use: Never used  Substance Use Topics   Alcohol use: Yes    Comment:  occasionally mixed drink   Drug use: No         OPHTHALMIC EXAM:  Base Eye Exam     Visual Acuity (Snellen - Linear)       Right Left   Dist [place] 20/25 -2 20/25   Dist ph [place] 20/20 20/20 -1         Tonometry (Tonopen, 1:57 PM)       Right Left   Pressure 08 10         Pupils       Dark Light Shape React APD   Right 3 2 Round Brisk None   Left 3 2 Round Brisk None         Visual Fields (Counting fingers)       Left Right    Full Full         Extraocular Movement       Right Left    Full, Ortho Full, Ortho         Neuro/Psych     Oriented x3: Yes   Mood/Affect: Normal         Dilation     Both eyes: 1.0% Mydriacyl, 2.5% Phenylephrine @ 1:57 PM           Slit Lamp and Fundus Exam     Slit Lamp Exam       Right Left   Lids/Lashes Dermatochalasis - upper lid Dermatochalasis - upper lid   Conjunctiva/Sclera White and quiet Temporal Pinguecula, sutures desolved   Cornea 1-2+ inferior Punctate epithelial erosions, Temporal Well healed cataract wounds 1-2+Punctate epithelial erosions, Temporal Well healed cataract wounds, Arcus   Anterior Chamber Deep and quiet Deep, clear   Iris Round and dilated Round and dilated   Lens PC IOL in good position, trace, non-central Posterior capsular opacification PC IOL in good position with open PC   Vitreous mild Vitreous syneresis, Posterior vitreous detachment post vitrectomy         Fundus Exam       Right Left   Disc Pink and Sharp, Compact Pink and Sharp, Tilted disc, Compact, mild Peripapillary atrophy   C/D Ratio 0.4 0.5   Macula Flat, good foveal reflex, mild Retinal pigment epithelial mottling, No heme or edema Flat, Blunted foveal reflex, stable resolution of trace cystic changes, no ERM, RPE mottling and clumping   Vessels mild attenuation Mild Vascular attenuation, mild Tortuousity   Periphery Attahced, Confluent mid-zonal drusen, No heme  Attached, confluent mid-zonal drusen, No heme             Refraction     Wearing Rx       Sphere Cylinder Axis   Right +2.25 +0.25 003   Left +2.75 +0.25 002    Type: SVL            IMAGING AND PROCEDURES  Imaging and Procedures for _0 @  OCT, Retina - OU - Both Eyes  Right Eye Quality was good. Central Foveal Thickness: 234. Progression has been stable. Findings include normal foveal contour, no IRF, no SRF (Partial PVD).   Left Eye Quality was borderline. Central Foveal Thickness: 335. Progression has been stable. Findings include no SRF, normal foveal contour, no IRF (Stable resolution of foveoschisis and cystic changes).   Notes *Images captured and stored on drive  Diagnosis / Impression:  OD: NFP, No IRF/SRF  OS: VMT resolved, Stable resolution of foveoschisis and cystic changes  Clinical management:  See below  Abbreviations: NFP - Normal foveal profile. CME - cystoid macular edema. PED - pigment epithelial detachment. IRF - intraretinal fluid. SRF - subretinal fluid. EZ - ellipsoid zone. ERM - epiretinal membrane. ORA - outer retinal atrophy. ORT - outer retinal tubulation. SRHM - subretinal hyper-reflective material                ASSESSMENT/PLAN:    ICD-10-CM   1. Vitreomacular adhesion of left eye  H43.822     2. Macular retinal cyst of left eye  H35.342     3. Epiretinal membrane (ERM) of left eye  H35.372     4. Retinal edema  H35.81 OCT, Retina - OU - Both Eyes    5. Posterior vitreous detachment of right eye  H43.811     6. Pseudophakia of both eyes  Z96.1      1-4. 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  Marvel Plan and resumption of primary eye care  5. PVD / vitreous syneresis OD  - Discussed findings and prognosis  - No RT or RD on 360 scleral depressed exam  - Reviewed s/s of RT/RD  - strict return precautions for any such RT/RD symptoms  6. Pseudophakia OU  - s/p CE/IOL OU  - s/p YAG capsulotomy OS 02/08/18  - stable  Ophthalmic Meds Ordered this visit:  No orders of the defined types were placed in this encounter.  Return if symptoms worsen or fail to improve.  There are no Patient Instructions on file for this visit.  This document serves as a record of services personally performed by Gardiner Sleeper, MD, PhD. It was created on their behalf by San Jetty. Owens Shark, OA an ophthalmic technician. The creation of this record is the provider's dictation and/or activities during the visit.    Electronically signed by: San Jetty. Owens Shark, [place] 11.01.2022 1:23 AM   Gardiner Sleeper, M.D., Ph.D. Diseases & Surgery of the Retina and Vitreous [facility]  I have reviewed the above documentation for accuracy and completeness, and I agree with the above. Gardiner Sleeper, M.D., Ph.D. 05/22/21 1:23 AM   Abbreviations: M myopia (nearsighted); A astigmatism; H hyperopia (farsighted); P presbyopia; Mrx spectacle prescription;  CTL contact lenses; OD right eye; OS left eye; OU both eyes  XT exotropia; ET esotropia; PEK punctate epithelial keratitis; PEE punctate epithelial erosions; DES dry eye syndrome; MGD meibomian gland dysfunction; ATs artificial tears; PFAT's preservative free artificial  tears; [place] nuclear sclerotic cataract; PSC posterior subcapsular cataract; ERM epi-retinal membrane; PVD posterior vitreous detachment; RD retinal detachment; DM diabetes mellitus; DR diabetic retinopathy; NPDR non-proliferative diabetic retinopathy; PDR proliferative diabetic retinopathy; CSME clinically significant macular edema; DME diabetic macular edema; dbh dot blot hemorrhages; CWS cotton  wool spot; POAG primary open angle glaucoma; C/D cup-to-disc ratio; HVF humphrey visual field; GVF goldmann visual field; OCT optical coherence tomography; IOP intraocular pressure; BRVO Branch retinal vein occlusion; CRVO central retinal vein occlusion; CRAO central retinal artery occlusion; BRAO branch retinal artery occlusion; RT retinal tear; SB scleral buckle; PPV pars plana vitrectomy; VH Vitreous hemorrhage; PRP panretinal laser photocoagulation; IVK intravitreal kenalog; VMT vitreomacular traction; MH Macular hole;  NVD neovascularization of the disc; NVE neovascularization elsewhere; AREDS age related eye disease study; ARMD age related macular degeneration; POAG primary open angle glaucoma; EBMD epithelial/anterior basement membrane dystrophy; ACIOL anterior chamber intraocular lens; IOL intraocular lens; PCIOL posterior chamber intraocular lens; Phaco/IOL phacoemulsification with intraocular lens placement; [place] photorefractive keratectomy; LASIK laser assisted in situ keratomileusis; HTN hypertension; DM diabetes mellitus; COPD chronic obstructive pulmonary disease

## 2021-05-20 ENCOUNTER — Encounter (INDEPENDENT_AMBULATORY_CARE_PROVIDER_SITE_OTHER): Payer: Medicare Other | Admitting: Ophthalmology

## 2021-05-20 DIAGNOSIS — H3581 Retinal edema: Secondary | ICD-10-CM

## 2021-05-20 DIAGNOSIS — H43811 Vitreous degeneration, right eye: Secondary | ICD-10-CM

## 2021-05-20 DIAGNOSIS — H35372 Puckering of macula, left eye: Secondary | ICD-10-CM

## 2021-05-20 DIAGNOSIS — H43822 Vitreomacular adhesion, left eye: Secondary | ICD-10-CM

## 2021-05-20 DIAGNOSIS — H35342 Macular cyst, hole, or pseudohole, left eye: Secondary | ICD-10-CM

## 2021-05-20 DIAGNOSIS — Z961 Presence of intraocular lens: Secondary | ICD-10-CM

## 2021-05-22 ENCOUNTER — Encounter (INDEPENDENT_AMBULATORY_CARE_PROVIDER_SITE_OTHER): Payer: Self-pay | Admitting: Ophthalmology

## 2022-01-08 ENCOUNTER — Other Ambulatory Visit: Payer: Self-pay | Admitting: Internal Medicine

## 2022-01-08 DIAGNOSIS — Z1231 Encounter for screening mammogram for malignant neoplasm of breast: Secondary | ICD-10-CM

## 2022-03-17 ENCOUNTER — Inpatient Hospital Stay: Admission: RE | Admit: 2022-03-17 | Payer: PRIVATE HEALTH INSURANCE | Source: Ambulatory Visit

## 2022-04-21 ENCOUNTER — Ambulatory Visit
Admission: RE | Admit: 2022-04-21 | Discharge: 2022-04-21 | Disposition: A | Discharge status: Home / Self Care | Payer: Medicare (Managed Care) | Source: Ambulatory Visit | Attending: Internal Medicine | Admitting: Internal Medicine

## 2022-04-21 DIAGNOSIS — Z1231 Encounter for screening mammogram for malignant neoplasm of breast: Secondary | ICD-10-CM

## 2023-03-23 ENCOUNTER — Other Ambulatory Visit: Payer: Self-pay | Admitting: Internal Medicine

## 2023-03-23 DIAGNOSIS — Z1231 Encounter for screening mammogram for malignant neoplasm of breast: Secondary | ICD-10-CM

## 2023-04-08 ENCOUNTER — Inpatient Hospital Stay
Admission: EM | Admit: 2023-04-08 | Discharge: 2023-04-12 | DRG: 282 | Disposition: A | Discharge status: Home / Self Care | Payer: Medicare (Managed Care) | Attending: Internal Medicine | Admitting: Internal Medicine

## 2023-04-08 DIAGNOSIS — R079 Chest pain, unspecified: Secondary | ICD-10-CM

## 2023-04-08 DIAGNOSIS — Z803 Family history of malignant neoplasm of breast: Secondary | ICD-10-CM

## 2023-04-08 DIAGNOSIS — I4891 Unspecified atrial fibrillation: Principal | ICD-10-CM | POA: Diagnosis present

## 2023-04-08 DIAGNOSIS — I2510 Atherosclerotic heart disease of native coronary artery without angina pectoris: Secondary | ICD-10-CM | POA: Diagnosis present

## 2023-04-08 DIAGNOSIS — R0989 Other specified symptoms and signs involving the circulatory and respiratory systems: Secondary | ICD-10-CM | POA: Diagnosis present

## 2023-04-08 DIAGNOSIS — I272 Pulmonary hypertension, unspecified: Secondary | ICD-10-CM | POA: Diagnosis present

## 2023-04-08 DIAGNOSIS — E876 Hypokalemia: Secondary | ICD-10-CM | POA: Diagnosis present

## 2023-04-08 DIAGNOSIS — I21A1 Myocardial infarction type 2: Secondary | ICD-10-CM | POA: Diagnosis present

## 2023-04-08 DIAGNOSIS — F0390 Unspecified dementia without behavioral disturbance: Secondary | ICD-10-CM | POA: Diagnosis present

## 2023-04-08 DIAGNOSIS — Z79899 Other long term (current) drug therapy: Secondary | ICD-10-CM

## 2023-04-08 DIAGNOSIS — Z86010 Personal history of colonic polyps: Secondary | ICD-10-CM

## 2023-04-08 DIAGNOSIS — I350 Nonrheumatic aortic (valve) stenosis: Secondary | ICD-10-CM | POA: Diagnosis present

## 2023-04-08 DIAGNOSIS — Z1152 Encounter for screening for COVID-19: Secondary | ICD-10-CM

## 2023-04-08 DIAGNOSIS — Z7982 Long term (current) use of aspirin: Secondary | ICD-10-CM

## 2023-04-08 DIAGNOSIS — K5790 Diverticulosis of intestine, part unspecified, without perforation or abscess without bleeding: Secondary | ICD-10-CM | POA: Diagnosis present

## 2023-04-08 DIAGNOSIS — N1831 Chronic kidney disease, stage 3a: Secondary | ICD-10-CM | POA: Diagnosis present

## 2023-04-08 DIAGNOSIS — Z7901 Long term (current) use of anticoagulants: Secondary | ICD-10-CM

## 2023-04-08 DIAGNOSIS — I358 Other nonrheumatic aortic valve disorders: Secondary | ICD-10-CM | POA: Diagnosis present

## 2023-04-08 DIAGNOSIS — I214 Non-ST elevation (NSTEMI) myocardial infarction: Secondary | ICD-10-CM

## 2023-04-08 DIAGNOSIS — I129 Hypertensive chronic kidney disease with stage 1 through stage 4 chronic kidney disease, or unspecified chronic kidney disease: Secondary | ICD-10-CM | POA: Diagnosis present

## 2023-04-08 DIAGNOSIS — I480 Paroxysmal atrial fibrillation: Principal | ICD-10-CM | POA: Diagnosis present

## 2023-04-08 DIAGNOSIS — I471 Supraventricular tachycardia, unspecified: Secondary | ICD-10-CM | POA: Diagnosis present

## 2023-04-08 DIAGNOSIS — R7989 Other specified abnormal findings of blood chemistry: Secondary | ICD-10-CM | POA: Insufficient documentation

## 2023-04-08 DIAGNOSIS — E1122 Type 2 diabetes mellitus with diabetic chronic kidney disease: Secondary | ICD-10-CM | POA: Diagnosis present

## 2023-04-08 DIAGNOSIS — E785 Hyperlipidemia, unspecified: Secondary | ICD-10-CM | POA: Diagnosis present

## 2023-04-08 HISTORY — DX: Endocarditis, valve unspecified: I38

## 2023-04-08 NOTE — ED Triage Notes (Signed)
Pt BIB [facility] from home c/o CP (resolved) and palpitations. EMS found her in A fib RVR, no hx of same. HR 120-190's. PTA gave 324 ASA and 500 fluids.

## 2023-04-08 NOTE — ED Provider Notes (Signed)
[facility] Provider Note    Event Date/Time   First MD Initiated Contact with Patient 04/08/23 2358     (approximate)   History   Palpitations   HPI  Hannah Hooper is a 89 y.o. female brought to the ED via EMS from home with a chief complaint of chest pain.  EMS reports 2 episodes of chest pain today.  Given aspirin per EMS prior to arrival.  EMS reports atrial fibrillation with highest rate to 190 on their monitor.  Patient with no prior history of atrial fibrillation.  EMS reports rate resolved itself without intervention.  Patient arrives to the emergency department awake, alert, with no complaints of chest pain.  Endorses mild shortness of breath.  Denies recent fever/chills, cough, abdominal pain, nausea, vomiting or dizziness.     Past Medical History   Past Medical History:  Diagnosis Date   Benign neoplasm of colon, unspecified    Carotid artery disease (HCC)    Diverticulosis    Hyperlipemia    Hypertension    Hypertensive kidney disease with CKD stage III (HCC)    Lesion of ulnar nerve    Osteoarthritis      Active Problem List  There are no problems to display for this patient.    Past Surgical History   Past Surgical History:  Procedure Laterality Date   bunions  1990   CARPOMETACARPAL [facility]) FUSION OF THUMB Left 06/24/2016   Procedure: CARPOMETACARPAL [facility]) ARthroplasty OF THUMB;  Surgeon: Kennedy Bucker, MD;  Location: [facility] ORS;  Service: Orthopedics;  Laterality: Left;   CATARACT EXTRACTION Bilateral    EYE SURGERY Bilateral 2014   cataract surgery   GAS/FLUID EXCHANGE Left 03/02/2018   Procedure: GAS/FLUID EXCHANGE;  Surgeon: Rennis Chris, MD;  Location: [facility] OR;  Service: Ophthalmology;  Laterality: Left;   MEMBRANE PEEL Left 03/02/2018   Procedure: MEMBRANE PEEL;  Surgeon: Rennis Chris, MD;  Location: [facility] OR;  Service: Ophthalmology;  Laterality: Left;   PARS PLANA VITRECTOMY Left 03/02/2018   Procedure: PARS PLANA VITRECTOMY  WITH 25 GAUGE;  Surgeon: Rennis Chris, MD;  Location: [facility] OR;  Service: Ophthalmology;  Laterality: Left;   SHOULDER ARTHROSCOPY Right 2014   TONSILLECTOMY     as a child   ULNAR NERVE TRANSPOSITION Right      Home Medications   Prior to Admission medications   Medication Sig Start Date End Date Taking? Authorizing Provider  amLODipine (NORVASC) 5 MG tablet Take 5 mg by mouth daily.    [provider]  HYDROcodone-acetaminophen (NORCO) 5-325 MG tablet Take 1 tablet by mouth every 6 (six) hours as needed for moderate pain. 06/24/16   Kennedy Bucker, MD  lisinopril-hydrochlorothiazide (PRINZIDE,ZESTORETIC) 20-12.5 MG tablet Take 1 tablet by mouth daily.    [provider]  pravastatin (PRAVACHOL) 80 MG tablet Take 80 mg by mouth at bedtime.    [provider]  prednisoLONE acetate (PRED FORTE) 1 % ophthalmic suspension Place 1 drop into the left eye 4 (four) times daily. Patient not taking: Reported on 05/15/2019 05/18/18   Rennis Chris, MD  sulfaSALAzine (AZULFIDINE) 500 MG tablet Take 1,000 mg by mouth daily.    [provider]  Tetrahydrozoline HCl (VISINE OP) Place 1 drop into both eyes daily as needed (for burning).     [provider]     Allergies  Patient has no known allergies.   Family History   Family History  Problem Relation Age of Onset   Breast  cancer Mother 50     Physical Exam  Triage Vital Signs: ED Triage Vitals  Encounter Vitals Group     BP      Systolic BP Percentile      Diastolic BP Percentile      Pulse      Resp      Temp      Temp src      SpO2      Weight      Height      Head Circumference      Peak Flow      Pain Score      Pain Loc      Pain Education      Exclude from Growth Chart     Updated Vital Signs: There were no vitals taken for this visit.   General: Awake, no distress.  CV:  ***Good peripheral perfusion.  Resp:  Normal effort. *** Abd:  No distention.  Other:  No  thyromegaly.  Bilateral calves are supple and nontender.   ED Results / Procedures / Treatments  Labs (all labs ordered are listed, but only abnormal results are displayed) Labs Reviewed  RESP PANEL BY RT-PCR (RSV, FLU A&B, COVID)  [id number]  CBC WITH DIFFERENTIAL/PLATELET  COMPREHENSIVE METABOLIC PANEL  BRAIN NATRIURETIC PEPTIDE  PROTIME-INR  TSH  T4, FREE  URINALYSIS, ROUTINE W REFLEX MICROSCOPIC  TROPONIN I (HIGH SENSITIVITY)     EKG  ED ECG REPORT I, Kirstyn Lean J, the attending physician, personally viewed and interpreted this ECG.   Date: 04/09/2023  EKG Time: ***  Rate: ***  Rhythm: {ekg findings:315101}  Axis: ***  Intervals:{conduction defects:17367}  ST&T Change: ***    RADIOLOGY I have independently visualized and interpreted patient's imaging study as well as noted the radiology interpretation:  {You MUST document your own interpretation of imaging, as well as the fact that you reviewed the radiologist's report!:1}  Official radiology report(s): No results found.   PROCEDURES:  Critical Care performed: Yes, see critical care procedure note(s)  CRITICAL CARE Performed by: Irean Hong   Total critical care time: *** minutes  Critical care time was exclusive of separately billable procedures and treating other patients.  Critical care was necessary to treat or prevent imminent or life-threatening deterioration.  Critical care was time spent personally by me on the following activities: development of treatment plan with patient and/or surrogate as well as nursing, discussions with consultants, evaluation of patient's response to treatment, examination of patient, obtaining history from patient or surrogate, ordering and performing treatments and interventions, ordering and review of laboratory studies, ordering and review of radiographic studies, pulse oximetry and re-evaluation of patient's condition.   Marland Kitchen1-3 Lead EKG Interpretation  Performed by:  Irean Hong, MD Authorized by: Irean Hong, MD     Ectopy: none     Conduction: normal   Comments:     Patient placed on cardiac monitor to evaluate for arrhythmias    MEDICATIONS ORDERED IN ED: Medications - No data to display   IMPRESSION / MDM / ASSESSMENT AND PLAN / ED COURSE  I reviewed the triage vital signs and the nursing notes.                             89 year old female brought to the ED for chest pain; new onset atrial fibrillation found on monitor. Differential diagnosis includes, but is not limited to, ACS, aortic dissection, pulmonary embolism, cardiac  tamponade, pneumothorax, pneumonia, pericarditis, myocarditis, GI-related causes including esophagitis/gastritis, and musculoskeletal chest wall pain.   I have personally reviewed patient's records and note a last PCP office visit on 10/13/2022 for dementia, hypertensive kidney disease, stage III CKD, prediabetes, carotid artery disease.  Patient's presentation is most consistent with acute presentation with potential threat to life or bodily function.  The patient is on the cardiac monitor to evaluate for evidence of arrhythmia and/or significant heart rate changes.  Will obtain cardiac panel, check COVID, thyroid function, chest x-ray.  Anticipate hospitalization.      FINAL CLINICAL IMPRESSION(S) / ED DIAGNOSES   Final diagnoses:  Atrial fibrillation with RVR (HCC)  Chest pain, unspecified type     Rx / DC Orders   ED Discharge Orders     None        Note:  This document was prepared using Dragon voice recognition software and may include unintentional dictation errors.

## 2023-04-09 ENCOUNTER — Encounter: Payer: Self-pay | Admitting: Family Medicine

## 2023-04-09 ENCOUNTER — Inpatient Hospital Stay: Payer: Medicare (Managed Care)

## 2023-04-09 ENCOUNTER — Other Ambulatory Visit: Payer: Self-pay

## 2023-04-09 ENCOUNTER — Emergency Department: Payer: Medicare (Managed Care)

## 2023-04-09 DIAGNOSIS — Z7901 Long term (current) use of anticoagulants: Secondary | ICD-10-CM | POA: Diagnosis not present

## 2023-04-09 DIAGNOSIS — R0989 Other specified symptoms and signs involving the circulatory and respiratory systems: Secondary | ICD-10-CM | POA: Diagnosis present

## 2023-04-09 DIAGNOSIS — N1831 Chronic kidney disease, stage 3a: Secondary | ICD-10-CM | POA: Diagnosis present

## 2023-04-09 DIAGNOSIS — R7989 Other specified abnormal findings of blood chemistry: Secondary | ICD-10-CM | POA: Diagnosis not present

## 2023-04-09 DIAGNOSIS — I214 Non-ST elevation (NSTEMI) myocardial infarction: Secondary | ICD-10-CM | POA: Diagnosis not present

## 2023-04-09 DIAGNOSIS — E876 Hypokalemia: Secondary | ICD-10-CM

## 2023-04-09 DIAGNOSIS — R011 Cardiac murmur, unspecified: Secondary | ICD-10-CM

## 2023-04-09 DIAGNOSIS — Z7982 Long term (current) use of aspirin: Secondary | ICD-10-CM | POA: Diagnosis not present

## 2023-04-09 DIAGNOSIS — I21A1 Myocardial infarction type 2: Secondary | ICD-10-CM | POA: Diagnosis present

## 2023-04-09 DIAGNOSIS — E785 Hyperlipidemia, unspecified: Secondary | ICD-10-CM | POA: Diagnosis present

## 2023-04-09 DIAGNOSIS — Z803 Family history of malignant neoplasm of breast: Secondary | ICD-10-CM | POA: Diagnosis not present

## 2023-04-09 DIAGNOSIS — I350 Nonrheumatic aortic (valve) stenosis: Secondary | ICD-10-CM | POA: Diagnosis present

## 2023-04-09 DIAGNOSIS — I471 Supraventricular tachycardia, unspecified: Secondary | ICD-10-CM | POA: Diagnosis present

## 2023-04-09 DIAGNOSIS — I129 Hypertensive chronic kidney disease with stage 1 through stage 4 chronic kidney disease, or unspecified chronic kidney disease: Secondary | ICD-10-CM | POA: Diagnosis present

## 2023-04-09 DIAGNOSIS — Z1152 Encounter for screening for COVID-19: Secondary | ICD-10-CM | POA: Diagnosis not present

## 2023-04-09 DIAGNOSIS — I4891 Unspecified atrial fibrillation: Secondary | ICD-10-CM | POA: Diagnosis present

## 2023-04-09 DIAGNOSIS — Z86010 Personal history of colonic polyps: Secondary | ICD-10-CM | POA: Diagnosis not present

## 2023-04-09 DIAGNOSIS — I2510 Atherosclerotic heart disease of native coronary artery without angina pectoris: Secondary | ICD-10-CM | POA: Diagnosis present

## 2023-04-09 DIAGNOSIS — R079 Chest pain, unspecified: Secondary | ICD-10-CM | POA: Diagnosis not present

## 2023-04-09 DIAGNOSIS — I480 Paroxysmal atrial fibrillation: Secondary | ICD-10-CM | POA: Diagnosis present

## 2023-04-09 DIAGNOSIS — E1122 Type 2 diabetes mellitus with diabetic chronic kidney disease: Secondary | ICD-10-CM | POA: Diagnosis present

## 2023-04-09 DIAGNOSIS — Z79899 Other long term (current) drug therapy: Secondary | ICD-10-CM | POA: Diagnosis not present

## 2023-04-09 DIAGNOSIS — K5790 Diverticulosis of intestine, part unspecified, without perforation or abscess without bleeding: Secondary | ICD-10-CM | POA: Diagnosis present

## 2023-04-09 DIAGNOSIS — I358 Other nonrheumatic aortic valve disorders: Secondary | ICD-10-CM | POA: Diagnosis present

## 2023-04-09 DIAGNOSIS — I272 Pulmonary hypertension, unspecified: Secondary | ICD-10-CM | POA: Diagnosis present

## 2023-04-09 DIAGNOSIS — F0390 Unspecified dementia without behavioral disturbance: Secondary | ICD-10-CM | POA: Diagnosis present

## 2023-04-09 LAB — APTT
aPTT: 31 seconds (ref 24–36)
aPTT: 63 seconds — ABNORMAL HIGH (ref 24–36)

## 2023-04-09 LAB — CBC WITH DIFFERENTIAL/PLATELET
Abs Immature Granulocytes: 0.02 10*3/uL (ref 0.00–0.07)
Basophils Absolute: 0 10*3/uL (ref 0.0–0.1)
Basophils Relative: 1 %
Eosinophils Absolute: 0 10*3/uL (ref 0.0–0.5)
Eosinophils Relative: 0 %
HCT: 43.1 % (ref 36.0–46.0)
Hemoglobin: 13.8 g/dL (ref 12.0–15.0)
Immature Granulocytes: 0 %
Lymphocytes Relative: 21 %
Lymphs Abs: 1.4 10*3/uL (ref 0.7–4.0)
MCH: 29.4 pg (ref 26.0–34.0)
MCHC: 32 g/dL (ref 30.0–36.0)
MCV: 91.7 fL (ref 80.0–100.0)
Monocytes Absolute: 0.6 10*3/uL (ref 0.1–1.0)
Monocytes Relative: 9 %
Neutro Abs: 4.5 10*3/uL (ref 1.7–7.7)
Neutrophils Relative %: 69 %
Platelets: 200 10*3/uL (ref 150–400)
RBC: 4.7 MIL/uL (ref 3.87–5.11)
RDW: 13.2 % (ref 11.5–15.5)
WBC: 6.5 10*3/uL (ref 4.0–10.5)
nRBC: 0 % (ref 0.0–0.2)

## 2023-04-09 LAB — COMPREHENSIVE METABOLIC PANEL
ALT: 25 U/L (ref 0–44)
AST: 27 U/L (ref 15–41)
Albumin: 3.9 g/dL (ref 3.5–5.0)
Alkaline Phosphatase: 60 U/L (ref 38–126)
Anion gap: 14 (ref 5–15)
BUN: 21 mg/dL (ref 8–23)
CO2: 23 mmol/L (ref 22–32)
Calcium: 8.9 mg/dL (ref 8.9–10.3)
Chloride: 102 mmol/L (ref 98–111)
Creatinine, Ser: 0.95 mg/dL (ref 0.44–1.00)
GFR, Estimated: 57 mL/min — ABNORMAL LOW (ref >60)
Glucose, Bld: 204 mg/dL — ABNORMAL HIGH (ref 70–99)
Potassium: 3.4 mmol/L — ABNORMAL LOW (ref 3.5–5.1)
Sodium: 139 mmol/L (ref 135–145)
Total Bilirubin: 0.8 mg/dL (ref 0.3–1.2)
Total Protein: 6.6 g/dL (ref 6.5–8.1)

## 2023-04-09 LAB — TROPONIN I (HIGH SENSITIVITY)
Troponin I (High Sensitivity): 10271 ng/L (ref <18)
Troponin I (High Sensitivity): 116 ng/L (ref <18)
Troponin I (High Sensitivity): 27 ng/L — ABNORMAL HIGH (ref <18)
Troponin I (High Sensitivity): 6206 ng/L (ref <18)

## 2023-04-09 LAB — RESP PANEL BY RT-PCR (RSV, FLU A&B, COVID)  RVPGX2
Influenza A by PCR: NEGATIVE
Influenza B by PCR: NEGATIVE
Resp Syncytial Virus by PCR: NEGATIVE
SARS Coronavirus 2 by RT PCR: NEGATIVE

## 2023-04-09 LAB — BRAIN NATRIURETIC PEPTIDE: B Natriuretic Peptide: 574.6 pg/mL — ABNORMAL HIGH (ref 0.0–100.0)

## 2023-04-09 LAB — HEPARIN LEVEL (UNFRACTIONATED): Heparin Unfractionated: 0.79 IU/mL — ABNORMAL HIGH (ref 0.30–0.70)

## 2023-04-09 LAB — PROTIME-INR
INR: 0.9 (ref 0.8–1.2)
Prothrombin Time: 12.6 seconds (ref 11.4–15.2)

## 2023-04-09 LAB — TSH: TSH: 3.501 u[IU]/mL (ref 0.350–4.500)

## 2023-04-09 LAB — T4, FREE: Free T4: 0.9 ng/dL (ref 0.61–1.12)

## 2023-04-09 LAB — D-DIMER, QUANTITATIVE: D-Dimer, Quant: 0.56 ug/mL-FEU — ABNORMAL HIGH (ref 0.00–0.50)

## 2023-04-09 MED ORDER — METOPROLOL TARTRATE 50 MG PO TABS
50.0000 mg | ORAL_TABLET | Freq: Two times a day (BID) | ORAL | Status: DC
  Administered 2023-04-09 – 2023-04-12 (×6): 50 mg via ORAL
  Filled 2023-04-09 (×6): qty 1

## 2023-04-09 MED ORDER — HEPARIN (PORCINE) 25000 UT/250ML-% IV SOLN
750.0000 [IU]/h | INTRAVENOUS | Status: DC
  Administered 2023-04-09: 650 [IU]/h via INTRAVENOUS
  Administered 2023-04-10: 750 [IU]/h via INTRAVENOUS
  Filled 2023-04-09 (×2): qty 250

## 2023-04-09 MED ORDER — ATORVASTATIN CALCIUM 80 MG PO TABS
80.0000 mg | ORAL_TABLET | Freq: Every day | ORAL | Status: DC
  Administered 2023-04-09 – 2023-04-12 (×4): 80 mg via ORAL
  Filled 2023-04-09 (×4): qty 1

## 2023-04-09 MED ORDER — ACETAMINOPHEN 325 MG PO TABS: 650.0000 mg | ORAL_TABLET | ORAL | Status: DC | PRN

## 2023-04-09 MED ORDER — DILTIAZEM HCL-DEXTROSE 125-5 MG/125ML-% IV SOLN (PREMIX)
5.0000 mg/h | INTRAVENOUS | Status: DC
  Filled 2023-04-09: qty 125

## 2023-04-09 MED ORDER — SODIUM CHLORIDE 0.9 % IV BOLUS
1000.0000 mL | Freq: Once | INTRAVENOUS | Status: AC
  Administered 2023-04-09: 1000 mL via INTRAVENOUS

## 2023-04-09 MED ORDER — MAGNESIUM HYDROXIDE 400 MG/5ML PO SUSP: 30.0000 mL | Freq: Every day | ORAL | Status: DC | PRN

## 2023-04-09 MED ORDER — CALCIUM GLUCONATE-NACL 1-0.675 GM/50ML-% IV SOLN
1.0000 g | Freq: Once | INTRAVENOUS | Status: AC
  Administered 2023-04-09: 1000 mg via INTRAVENOUS
  Filled 2023-04-09: qty 50

## 2023-04-09 MED ORDER — HEPARIN BOLUS VIA INFUSION
1500.0000 [IU] | Freq: Once | INTRAVENOUS | Status: AC
  Administered 2023-04-09: 1500 [IU] via INTRAVENOUS
  Filled 2023-04-09: qty 1500

## 2023-04-09 MED ORDER — ASPIRIN 81 MG PO CHEW
81.0000 mg | CHEWABLE_TABLET | Freq: Every day | ORAL | Status: DC
  Administered 2023-04-09 – 2023-04-12 (×4): 81 mg via ORAL
  Filled 2023-04-09 (×4): qty 1

## 2023-04-09 MED ORDER — APIXABAN 2.5 MG PO TABS
2.5000 mg | ORAL_TABLET | Freq: Two times a day (BID) | ORAL | Status: DC
  Administered 2023-04-09: 2.5 mg via ORAL
  Filled 2023-04-09 (×2): qty 1

## 2023-04-09 MED ORDER — TRAZODONE HCL 50 MG PO TABS: 25.0000 mg | ORAL_TABLET | Freq: Every evening | ORAL | Status: DC | PRN

## 2023-04-09 MED ORDER — DILTIAZEM LOAD VIA INFUSION
2.5000 mg | Freq: Once | INTRAVENOUS | Status: AC
  Administered 2023-04-09: 2.5 mg via INTRAVENOUS
  Filled 2023-04-09: qty 3

## 2023-04-09 MED ORDER — ONDANSETRON HCL 4 MG/2ML IJ SOLN: 4.0000 mg | Freq: Four times a day (QID) | INTRAMUSCULAR | Status: DC | PRN

## 2023-04-09 MED ORDER — POTASSIUM CHLORIDE 20 MEQ PO PACK
40.0000 meq | PACK | Freq: Once | ORAL | Status: AC
  Administered 2023-04-09: 40 meq via ORAL
  Filled 2023-04-09: qty 2

## 2023-04-09 MED ORDER — DILTIAZEM HCL 25 MG/5ML IV SOLN
10.0000 mg | Freq: Once | INTRAVENOUS | Status: AC
  Administered 2023-04-09: 10 mg via INTRAVENOUS
  Filled 2023-04-09: qty 5

## 2023-04-09 MED ORDER — SODIUM CHLORIDE 0.9 % IV BOLUS
250.0000 mL | Freq: Once | INTRAVENOUS | Status: AC
  Administered 2023-04-09: 250 mL via INTRAVENOUS

## 2023-04-09 MED ORDER — DILTIAZEM HCL-DEXTROSE 125-5 MG/125ML-% IV SOLN (PREMIX)
5.0000 mg/h | INTRAVENOUS | Status: DC
  Administered 2023-04-09: 5 mg/h via INTRAVENOUS

## 2023-04-09 NOTE — Progress Notes (Signed)
Patient is seen and examined today morning.  She is admitted for evaluation of chest pain, palpitations.  Patient's troponin peaked to 6206, started on heparin drip per pharmacy [facility] protocol this morning.  She does not have any complaints of chest pain today morning. Patient's A-fib is well-controlled, beta-blocker restarted.  Patient has severe aortic stenosis and will need further management as per cardiology.  She was asked to be full code.  Further management as per clinical course.

## 2023-04-09 NOTE — H&P (Addendum)
[facility]   PATIENT NAME: Hannah Hooper    MR#:  [id number]  DATE OF BIRTH:  March 28, 1932  DATE OF ADMISSION:  04/08/2023  PRIMARY CARE PHYSICIAN: Lauro Regulus, MD   Patient is coming from: Home  REQUESTING/REFERRING PHYSICIAN: Chiquita Loth, MD  CHIEF COMPLAINT:   Chief Complaint  Patient presents with   Palpitations    HISTORY OF PRESENT ILLNESS:  Hannah Hooper is a 89 y.o. Caucasian female with medical history significant for paroxysmal atrial fibrillation, hypertension, dyslipidemia, diverticulosis, coronary artery disease and stage III chronic kidney disease, who presented to the emergency room with acute onset of palpitations with associated chest pain felt this tightness that started at 10 PM tonight.  No fever or chills.  No dyspnea or cough or wheezing.  No nausea or vomiting or abdominal pain.  No leg pain or edema or recent travels or surgeries.  No bleeding diathesis.  No dysuria, oliguria or hematuria or flank pain.  ED Course: Initially in the ER heart rate was 156 with a BP of 143/90 with otherwise normal vital signs.  Labs revealed a high sensitive troponin I of 27 and later 116.  CBC was within normal.  Respiratory panel came back negative. EKG as reviewed by me : EKG initially showed Atrial fibrillation with RVR of 136 and later sinus rhythm with rate of 76 with premature supraventricular complexes and T wave inversion anterolaterally. Imaging: Portable chest x-ray showed no acute cardiopulmonary disease.  The patient was given 10 mg of IV Cardizem followed by IV Cardizem drip and 1 L bolus of IV normal saline as well as 1 g of IV calcium gluconate.Marland Kitchen  She will be admitted to a progressive unit bed for further evaluation and management. PAST MEDICAL HISTORY:   Past Medical History:  Diagnosis Date   Benign neoplasm of colon, unspecified    Carotid artery disease (HCC)    Diverticulosis    Hyperlipemia    Hypertension    Hypertensive kidney disease with  CKD stage III (HCC)    Lesion of ulnar nerve    Osteoarthritis   -Paroxysmal atrial fibrillation  PAST SURGICAL HISTORY:   Past Surgical History:  Procedure Laterality Date   bunions  1990   CARPOMETACARPAL ([facility]) FUSION OF THUMB Left 06/24/2016   Procedure: CARPOMETACARPAL [facility]) ARthroplasty OF THUMB;  Surgeon: Kennedy Bucker, MD;  Location: [facility] ORS;  Service: Orthopedics;  Laterality: Left;   CATARACT EXTRACTION Bilateral    EYE SURGERY Bilateral 2014   cataract surgery   GAS/FLUID EXCHANGE Left 03/02/2018   Procedure: GAS/FLUID EXCHANGE;  Surgeon: Rennis Chris, MD;  Location: [facility] OR;  Service: Ophthalmology;  Laterality: Left;   MEMBRANE PEEL Left 03/02/2018   Procedure: MEMBRANE PEEL;  Surgeon: Rennis Chris, MD;  Location: [facility] OR;  Service: Ophthalmology;  Laterality: Left;   PARS PLANA VITRECTOMY Left 03/02/2018   Procedure: PARS PLANA VITRECTOMY WITH 25 GAUGE;  Surgeon: Rennis Chris, MD;  Location: [facility] OR;  Service: Ophthalmology;  Laterality: Left;   SHOULDER ARTHROSCOPY Right 2014   TONSILLECTOMY     as a child   ULNAR NERVE TRANSPOSITION Right     SOCIAL HISTORY:   Social History   Tobacco Use   Smoking status: Never   Smokeless tobacco: Never  Substance Use Topics   Alcohol use: Yes    Comment: occasionally mixed drink    FAMILY HISTORY:   Family History  Problem Relation Age of Onset   Breast cancer Mother 20  DRUG ALLERGIES:  No Known Allergies  REVIEW OF SYSTEMS:   ROS As per history of present illness. All pertinent systems were reviewed above. Constitutional, HEENT, cardiovascular, respiratory, GI, GU, musculoskeletal, neuro, psychiatric, endocrine, integumentary and hematologic systems were reviewed and are otherwise negative/unremarkable except for positive findings mentioned above in the HPI.   MEDICATIONS AT HOME:   Prior to Admission medications   Medication Sig Start Date End Date Taking? Authorizing Provider  amLODipine (NORVASC) 5 MG  tablet Take 5 mg by mouth daily.   Yes [provider]  cyanocobalamin (VITAMIN B12) 1000 MCG tablet Take 1,000 mcg by mouth daily. 02/26/20  Yes [provider]  HYDROcodone-acetaminophen (NORCO) 5-325 MG tablet Take 1 tablet by mouth every 6 (six) hours as needed for moderate pain. 06/24/16  Yes Kennedy Bucker, MD  lisinopril-hydrochlorothiazide (PRINZIDE,ZESTORETIC) 20-12.5 MG tablet Take 1 tablet by mouth daily.   Yes [provider]  pravastatin (PRAVACHOL) 80 MG tablet Take 80 mg by mouth at bedtime.   Yes [provider]  sulfaSALAzine (AZULFIDINE) 500 MG tablet Take 1,000 mg by mouth daily.   Yes [provider]  prednisoLONE acetate (PRED FORTE) 1 % ophthalmic suspension Place 1 drop into the left eye 4 (four) times daily. Patient not taking: Reported on 05/15/2019 05/18/18   Rennis Chris, MD  Tetrahydrozoline HCl (VISINE OP) Place 1 drop into both eyes daily as needed (for burning).  Patient not taking: Reported on 04/09/2023    [provider]      VITAL SIGNS:  Blood pressure (!) 106/54, pulse 70, temperature 97.7 F (36.5 C), temperature source Oral, resp. rate 20, height 5' (1.524 m), weight 54.4 kg, SpO2 96%.  PHYSICAL EXAMINATION:  Physical Exam  GENERAL:  89 y.o.-year-old Caucasian female patient lying in the bed with no acute distress.  EYES: Pupils equal, round, reactive to light and accommodation. No scleral icterus. Extraocular muscles intact.  HEENT: Head atraumatic, normocephalic. Oropharynx and nasopharynx clear.  NECK:  Supple, no jugular venous distention. No thyroid enlargement, no tenderness.  LUNGS: Normal breath sounds bilaterally, no wheezing, rales,rhonchi or crepitation. No use of accessory muscles of respiration.  CARDIOVASCULAR: Irregularly regular tachycardic rhythm, S1, S2 normal. No murmurs, rubs, or gallops.  ABDOMEN: Soft, nondistended, nontender. Bowel sounds present. No organomegaly or mass.   EXTREMITIES: No pedal edema, cyanosis, or clubbing.  NEUROLOGIC: Cranial nerves II through XII are intact. Muscle strength 5/5 in all extremities. Sensation intact. Gait not checked.  PSYCHIATRIC: The patient is alert and oriented x 3.  Normal affect and good eye contact. SKIN: No obvious rash, lesion, or ulcer.   LABORATORY PANEL:   CBC Recent Labs  Lab 04/09/23 0006  WBC 6.5  HGB 13.8  HCT 43.1  PLT 200   ------------------------------------------------------------------------------------------------------------------  Chemistries  Recent Labs  Lab 04/09/23 0006  NA 139  K 3.4*  CL 102  CO2 23  GLUCOSE 204*  BUN 21  CREATININE 0.95  CALCIUM 8.9  AST 27  ALT 25  ALKPHOS 60  BILITOT 0.8   ------------------------------------------------------------------------------------------------------------------  Cardiac Enzymes No results for input(s): "TROPONINI" in the last 168 hours. ------------------------------------------------------------------------------------------------------------------  RADIOLOGY:  DG Chest Port 1 View  Result Date: 04/09/2023 CLINICAL DATA:  Chest pain EXAM: PORTABLE CHEST 1 VIEW COMPARISON:  None Available. FINDINGS: The heart size and mediastinal contours are within normal limits. Both lungs are clear. The visualized skeletal structures are unremarkable. IMPRESSION: No active disease. Electronically Signed   By: Jasmine Pang M.D.   On:  04/09/2023 00:31      IMPRESSION AND PLAN:  Assessment and Plan: * Atrial fibrillation with rapid ventricular response (HCC) - The patient will be admitted to a progressive unit bed. -Patient is having associated chest pain - We will continue IV Cardizem drip. - We will continue Eliquis. - Will optimize electrolytes. - 2D echo and cardiology consult will be obtained. - I notified Dr. Elease Hashimoto about the patient.  Hypokalemia - Potassium will be placed and magnesium level will be checked.  Elevated  brain natriuretic peptide (BNP) level - Will obtain a D-dimer to rule out PE. - Chest x-ray showed no evidence for CHF. - The patient was not clinically in CHF. - This is likely secondary to her atrial fibrillation.  Elevated troponin - We will follow serial troponins. - This is like secondary to her atrial fibrillation with RVR and possibly associated CHF.       DVT prophylaxis: Eliquis. Advanced Care Planning:  Code Status: full code.  Family Communication:  The plan of care was discussed in details with the patient (and family). I answered all questions. The patient agreed to proceed with the above mentioned plan. Further management will depend upon hospital course. Disposition Plan: Back to previous home environment Consults called: Cardiology All the records are reviewed and case discussed with ED provider.  Status is: Inpatient    At the time of the admission, it appears that the appropriate admission status for this patient is inpatient.  This is judged to be reasonable and necessary in order to provide the required intensity of service to ensure the patient's safety given the presenting symptoms, physical exam findings and initial radiographic and laboratory data in the context of comorbid conditions.  The patient requires inpatient status due to high intensity of service, high risk of further deterioration and high frequency of surveillance required.  I certify that at the time of admission, it is my clinical judgment that the patient will require inpatient hospital care extending more than 2 midnights.                            Dispo: The patient is from: Home              Anticipated d/c is to: Home              Patient currently is not medically stable to d/c.              Difficult to place patient: No  Hannah Beat M.D on 04/09/2023 at 5:12 AM  [facility]   From 7 PM-7 AM, contact night-coverage [email or web address]  CC: Primary care physician; Lauro Regulus, MD

## 2023-04-09 NOTE — Assessment & Plan Note (Signed)
-  Potassium will be placed and magnesium level will be checked. ?

## 2023-04-09 NOTE — H&P (Signed)
Cardiology Consult    Patient ID: Hannah Hooper MRN: [id number], DOB/AGE: 01/17/1932   Admit date: 04/08/2023 Date of Consult: 04/09/2023  Primary Physician: Lauro Regulus, MD Primary Cardiologist: None - new Requesting Provider: N. Clide Dales, MD  Patient Profile    Hannah Hooper is a 89 y.o. female with a history of Dementia, HTN, HL, CKD III, prediabetes, carotid dzs, OA, valvular heart dzs and diverticulosis, who is being seen today for the evaluation of afib w/ RVR and NSTEMI at the request of Dr. Clide Dales.  Past Medical History   Past Medical History:  Diagnosis Date   Benign neoplasm of colon, unspecified    Carotid artery disease (HCC)    Diverticulosis    Hyperlipemia    Hypertension    Hypertensive kidney disease with CKD stage III (HCC)    Lesion of ulnar nerve    Osteoarthritis    Valvular heart disease    a. 02/2020 Echo: EF 50%, triv AI, mild AS/MR/MS/TR.    Past Surgical History:  Procedure Laterality Date   bunions  1990   CARPOMETACARPAL [facility]) FUSION OF THUMB Left 06/24/2016   Procedure: CARPOMETACARPAL [facility]) ARthroplasty OF THUMB;  Surgeon: Kennedy Bucker, MD;  Location: [facility] ORS;  Service: Orthopedics;  Laterality: Left;   CATARACT EXTRACTION Bilateral    EYE SURGERY Bilateral 2014   cataract surgery   GAS/FLUID EXCHANGE Left 03/02/2018   Procedure: GAS/FLUID EXCHANGE;  Surgeon: Rennis Chris, MD;  Location: [facility] OR;  Service: Ophthalmology;  Laterality: Left;   MEMBRANE PEEL Left 03/02/2018   Procedure: MEMBRANE PEEL;  Surgeon: Rennis Chris, MD;  Location: [facility] OR;  Service: Ophthalmology;  Laterality: Left;   PARS PLANA VITRECTOMY Left 03/02/2018   Procedure: PARS PLANA VITRECTOMY WITH 25 GAUGE;  Surgeon: Rennis Chris, MD;  Location: [facility] OR;  Service: Ophthalmology;  Laterality: Left;   SHOULDER ARTHROSCOPY Right 2014   TONSILLECTOMY     as a child   ULNAR NERVE TRANSPOSITION Right      Allergies  No Known Allergies  History of Present Illness    89  y/o ? w/ a h/o Dementia, HTN, HL, CKD III, prediabetes, carotid dzs, OA, valvular heart dzs, carotid bruits, and diverticulosis.  Previous echo in 02/2020 showed EF of 50% w/ mild AS/MR/MS/TR. she lives locally with her son and a friend.  She is active at home and still drives and does all of her grocery shopping.  I spoke with her son and friend this morning, who were with her last night.  They confirmed that she is active, though she does have some dementia and memory loss.  Ms. Sluyter does not remember exactly why she presented to the hospital last night but believes she was having chest pain.  In discussing this with her son and friend, she reported to them that she was having difficulty breathing and felt like she was running a marathon.  They immediately called EMS, and she was found to be in rapid atrial fibrillation and transported to the [facility] ED.  Here, she was afebrile and tachycardic @ 156 (ECG: Afib, 116, inferior infarct, 1-2 mm inferior ST elevation w/ lateral ST depression - new).  BP 143/90 on arrival and O2 sat 99% on RA.  CXR w/o active dzs.  Labs notable for K of 3.4, BNP 574.6, and hsTrop of 27  116  6206.  TSH/FT4 nl.  She was treated w/ Dilt bolus of 10 mg followed by gtt.  She was also give 1 amp  of calcium gluconate and a liter of fluid.  She converted to sinus rhythm just before 4 AM this morning with complete resolution of ST segment elevation.  Patient has no complaints this morning.  Repeat troponin this morning returned at 10,271.  Inpatient Medications     aspirin  81 mg Oral Daily   atorvastatin  80 mg Oral Daily   metoprolol tartrate  50 mg Oral BID  Heparin infusion  Family History    Family History  Problem Relation Age of Onset   Breast cancer Mother 28   She indicated that the status of her mother is unknown.   Social History    Social History   Socioeconomic History   Marital status: Widowed    Spouse name: Not on file   Number of children: Not on file    Years of education: Not on file   Highest education level: Not on file  Occupational History   Not on file  Tobacco Use   Smoking status: Never   Smokeless tobacco: Never  Vaping Use   Vaping status: Never Used  Substance and Sexual Activity   Alcohol use: Not Currently    Comment: occasionally mixed drink   Drug use: No   Sexual activity: Never  Other Topics Concern   Not on file  Social History Narrative   Lives locally with son and friend.  Very active.  Does not routinely exercise.  Still drives.   Social Determinants of Health   Financial Resource Strain: Not on file  Food Insecurity: Not on file  Transportation Needs: Not on file  Physical Activity: Not on file  Stress: Not on file  Social Connections: Not on file  Intimate Partner Violence: Not on file     Review of Systems    General:  No chills, fever, night sweats or weight changes.  Cardiovascular:  +++ chest pain, +++ dyspnea, no edema, orthopnea, +++ palpitations, no paroxysmal nocturnal dyspnea. Dermatological: No rash, lesions/masses Respiratory: No cough, +++ dyspnea Urologic: No hematuria, dysuria Abdominal:   No nausea, vomiting, diarrhea, bright red blood per rectum, melena, or hematemesis Neurologic:  +++ Mild dementia with memory loss.  No visual changes, wkns, changes in mental status. All other systems reviewed and are otherwise negative except as noted above.  Physical Exam    Blood pressure (!) 128/57, pulse 77, temperature 98.4 F (36.9 C), temperature source Oral, resp. rate 20, height 5' (1.524 m), weight 54.4 kg, SpO2 93%.  General: Pleasant, NAD Psych: Normal affect. Neuro: Alert and oriented X 3. Moves all extremities spontaneously. HEENT: Normal  Neck: Supple without JVD.  Bilateral carotid bruits. Lungs:  Resp regular and unlabored, CTA. Heart: RRR no s3, s4.  2/6 systolic murmur heard throughout, loudest at the upper sternal borders. Abdomen: Soft, non-tender, non-distended, BS + x  4.  Extremities: No clubbing, cyanosis or edema. DP/PT2+, Radials 2+ and equal bilaterally.  Labs    Cardiac Enzymes Recent Labs  Lab 04/09/23 0006 04/09/23 0145 04/09/23 0625 04/09/23 0749  TROPONINIHS 27* 116* 6,206* 10,271*     BNP    Component Value Date/Time   BNP 574.6 (H) 04/09/2023 0006   Lab Results  Component Value Date   WBC 6.5 04/09/2023   HGB 13.8 04/09/2023   HCT 43.1 04/09/2023   MCV 91.7 04/09/2023   PLT 200 04/09/2023    Recent Labs  Lab 04/09/23 0006  NA 139  K 3.4*  CL 102  CO2 23  BUN 21  CREATININE  0.95  CALCIUM 8.9  PROT 6.6  BILITOT 0.8  ALKPHOS 60  ALT 25  AST 27  GLUCOSE 204*   Lab Results  Component Value Date   DDIMER 0.56 (H) 04/09/2023    Radiology Studies    DG Chest Port 1 View  Result Date: 04/09/2023 CLINICAL DATA:  Chest pain EXAM: PORTABLE CHEST 1 VIEW COMPARISON:  None Available. FINDINGS: The heart size and mediastinal contours are within normal limits. Both lungs are clear. The visualized skeletal structures are unremarkable. IMPRESSION: No active disease. Electronically Signed   By: Jasmine Pang M.D.   On: 04/09/2023 00:31    ECG & Cardiac Imaging    9/21 @ 0026 - Afib, 116, inferior infarct, 1-2 mm inferior ST elevation w/ lateral ST depression - personally reviewed  09/21 @ 0438 - Sinus rhythm, freq PACs, 76, inferior/anterolateral ST/T abnormalities - ST elevation resolved. - personally reviewed.  Assessment & Plan    1.  Non-STEMI: Patient presented with chest pain, dyspnea, and palpitations, he was found to be in rapid atrial fibrillation at 156 bpm.  She initially had inferior ST segment elevation with reciprocal changes, with resolution of ST elevation with rate control and subsequent conversion to sinus rhythm.  Troponin was initially 27 has subsequently risen to 10,271.  She is currently symptom-free.  BNP elevated on presentation, though she is euvolemic on examination and chest x-ray did not show any  active disease.  We have added aspirin and statin therapy.  Continue heparin.  Switching intravenous diltiazem to oral metoprolol.  Follow-up echo today.  Discussed with patient, son, and friend.  All are in agreement to proceed with diagnostic catheterization on Monday.  The patient understands that risks include but are not limited to stroke (1 in 1000), death (1 in 1000), kidney failure [usually temporary] (1 in 500), bleeding (1 in 200), allergic reaction [possibly serious] (1 in 200), and agrees to proceed.    2.  Atrial fibrillation with rapid ventricular response: No known prior history of atrial fibrillation.  She was tachycardic on admission with dynamic ST segment changes as outlined above.  She subsequently converted with IV diltiazem, which has since been discontinued in favor of oral metoprolol.  Continue heparin.  CHA2DS2-VASc equals at least 5 and pending echo, potentially 6.  Await diagnostic catheterization but will require oral anticoagulation prior to discharge.  3.  Aortic stenosis: Patient with prior history of mild aortic stenosis.  She has an impressive AS murmur on examination.  Await repeat echo.  Can further assess gradient at time of catheterization if appropriate.  4.  Essential hypertension: Currently stable.  Follow on oral beta-blocker.  Was previously on amlodipine and lisinopril HCTZ as outpatient.  5.  Bilateral carotid bruits: Outpatient notes indicate a history of carotid disease, though I do not see that she has had a carotid ultrasound either through our system or through her primary care's office.  Will arrange.  Aspirin and statin added.  6.  Hyperlipidemia: LDL of 201 earlier this month.  She is on pravastatin at home.  Changing to atorvastatin 80 mg as outlined above.  7.  Dementia: Outpatient primary care notes indicate that she has been taking Aricept 5 mg at bedtime.  Defer to primary team.  Risk Assessment/Risk Scores:     TIMI Risk Score for Unstable  Angina or Non-ST Elevation MI:   The patient's TIMI risk score is 4, which indicates a 20% risk of all cause mortality, new or recurrent myocardial infarction  or need for urgent revascularization in the next 14 days.    CHA2DS2-VASc Score = 5   This indicates a 7.2% annual risk of stroke. The patient's score is based upon: CHF History: 0 HTN History: 1 Diabetes History: 0 Stroke History: 0 Vascular Disease History: 1 Age Score: 2 Gender Score: 1     Signed, Nicolasa Ducking, NP 04/09/2023, 10:45 AM  For questions or updates, please contact   Please consult [email or web address] for contact info under Cardiology/STEMI.

## 2023-04-09 NOTE — Consult Note (Signed)
ANTICOAGULATION CONSULT NOTE  Pharmacy Consult for Heparin Infusion Indication: chest pain/ACS  No Known Allergies  Patient Measurements: Height: 5' (152.4 cm) Weight: 54.4 kg (120 lb) IBW/kg (Calculated) : 45.5 Heparin Dosing Weight: 54.4 kg  Vital Signs: Temp: 99.3 F (37.4 C) (09/21 1729) Temp Source: Oral (09/21 1729) BP: 121/65 (09/21 1729) Pulse Rate: 85 (09/21 1729)  Labs: Recent Labs    04/09/23 0006 04/09/23 0145 04/09/23 0625 04/09/23 0749 04/09/23 0826 04/09/23 1803  HGB 13.8  --   --   --   --   --   HCT 43.1  --   --   --   --   --   PLT 200  --   --   --   --   --   APTT  --   --   --   --  31 63*  LABPROT 12.6  --   --   --   --   --   INR 0.9  --   --   --   --   --   HEPARINUNFRC  --   --   --   --  0.79*  --   CREATININE 0.95  --   --   --   --   --   TROPONINIHS 27* 116* 6,206* 10,271*  --   --     Estimated Creatinine Clearance: 28.8 mL/min (by C-G formula based on SCr of 0.95 mg/dL).   Medical History: Past Medical History:  Diagnosis Date   Benign neoplasm of colon, unspecified    Carotid artery disease (HCC)    Diverticulosis    Hyperlipemia    Hypertension    Hypertensive kidney disease with CKD stage III (HCC)    Lesion of ulnar nerve    Osteoarthritis    Valvular heart disease    a. 02/2020 Echo: EF 50%, triv AI, mild AS/MR/MS/TR.   Assessment: Hannah Hooper is a 89 y.o. female presenting with palpitations and chest tightness. PMH significant for AF, HTN, HLD, CAD, CKD3, diverticulosis. Patient was on [facility] PTA per chart review. Last dose of apixaban 2.5 was 9/21 at 0253. Pharmacy has been consulted to initiate and manage heparin infusion.   Baseline Labs: aPTT 31, HL 0.79, PT 12.6, INR 0.9, Hgb 13.8, Hct 43.1, Plt 200   Goal of Therapy:  Heparin level 0.3-0.7 units/ml aPTT 66-102 seconds Monitor platelets by anticoagulation protocol: Yes   Date Time aPTT/HL Rate/Comment  9/21 1803 63/---  650/subtherapeutic   Plan:  Increase  heparin infusion to 750 units/hr Check aPTT in 8 hours Continue to monitor aPTT until HL and aPTT correlate.  Check HL daily for correlation until HL and aPTT correlate. Switch to HL monitoring once HL and aPTT correlate.  Continue to monitor H&H and platelets daily while on heparin infusion    Celene Squibb, PharmD Clinical Pharmacist 04/09/2023 6:33 PM

## 2023-04-09 NOTE — Assessment & Plan Note (Addendum)
-   Will obtain a D-dimer to rule out PE. - Chest x-ray showed no evidence for CHF. - The patient was not clinically in CHF. - This is likely secondary to her atrial fibrillation.

## 2023-04-09 NOTE — Plan of Care (Signed)

## 2023-04-09 NOTE — Assessment & Plan Note (Addendum)
-   The patient will be admitted to a progressive unit bed. -Patient is having associated chest pain - We will continue IV Cardizem drip. - We will continue Eliquis. - Will optimize electrolytes. - 2D echo and cardiology consult will be obtained. - I notified Dr. Elease Hashimoto about the patient.

## 2023-04-09 NOTE — Assessment & Plan Note (Signed)
-   We will follow serial troponins. - This is like secondary to her atrial fibrillation with RVR and possibly associated CHF.

## 2023-04-09 NOTE — Consult Note (Addendum)
ANTICOAGULATION CONSULT NOTE  Pharmacy Consult for Heparin Indication: chest pain/ACS  No Known Allergies  Patient Measurements: Height: 5' (152.4 cm) Weight: 54.4 kg (120 lb) IBW/kg (Calculated) : 45.5 Heparin Dosing Weight: 54.4 kg  Vital Signs: Temp: 98.4 F (36.9 C) (09/21 0743) Temp Source: Oral (09/21 0743) BP: 132/68 (09/21 1200) Pulse Rate: 54 (09/21 1200)  Labs: Recent Labs    04/09/23 0006 04/09/23 0145 04/09/23 0625 04/09/23 0749 04/09/23 0826  HGB 13.8  --   --   --   --   HCT 43.1  --   --   --   --   PLT 200  --   --   --   --   APTT  --   --   --   --  31  LABPROT 12.6  --   --   --   --   INR 0.9  --   --   --   --   HEPARINUNFRC  --   --   --   --  0.79*  CREATININE 0.95  --   --   --   --   TROPONINIHS 27* 116* 6,206* 10,271*  --     Estimated Creatinine Clearance: 28.8 mL/min (by C-G formula based on SCr of 0.95 mg/dL).   Medical History: Past Medical History:  Diagnosis Date   Benign neoplasm of colon, unspecified    Carotid artery disease (HCC)    Diverticulosis    Hyperlipemia    Hypertension    Hypertensive kidney disease with CKD stage III (HCC)    Lesion of ulnar nerve    Osteoarthritis    Valvular heart disease    a. 02/2020 Echo: EF 50%, triv AI, mild AS/MR/MS/TR.    Medications:  Apixaban 2.5mg  BID, last dose: 0921@0253   Assessment: 89 y.o. Caucasian female with medical history significant for paroxysmal atrial fibrillation, hypertension, dyslipidemia, diverticulosis, coronary artery disease and stage III chronic kidney disease, who presented to the emergency room with acute onset of palpitations with associated chest pain felt this tightness. Pharmacy has been consulted to initiate heparin infusion. Baseline labs have been ordered and are pending   Goal of Therapy:  Heparin level 0.3-0.7 units/ml aPTT 66-102 seconds Monitor platelets by anticoagulation protocol: Yes   Plan:  Give 1500 units bolus x 1 Start heparin  infusion at 650 units/hr Check aPTT level in 8 hours and HL daily while on heparin Will transition to HL dosing once aPTT correlates Continue to monitor H&H and platelets  Merie Wulf A [facility] [place] 04/09/2023,2:07 PM

## 2023-04-10 DIAGNOSIS — I4891 Unspecified atrial fibrillation: Secondary | ICD-10-CM | POA: Diagnosis not present

## 2023-04-10 DIAGNOSIS — I214 Non-ST elevation (NSTEMI) myocardial infarction: Secondary | ICD-10-CM | POA: Diagnosis not present

## 2023-04-10 DIAGNOSIS — R0789 Other chest pain: Secondary | ICD-10-CM

## 2023-04-10 DIAGNOSIS — E876 Hypokalemia: Secondary | ICD-10-CM | POA: Diagnosis not present

## 2023-04-10 DIAGNOSIS — R7989 Other specified abnormal findings of blood chemistry: Secondary | ICD-10-CM | POA: Diagnosis not present

## 2023-04-10 LAB — TROPONIN I (HIGH SENSITIVITY)
Troponin I (High Sensitivity): 5738 ng/L (ref <18)
Troponin I (High Sensitivity): 4929 ng/L (ref <18)

## 2023-04-10 LAB — CBC
HCT: 37.9 % (ref 36.0–46.0)
Hemoglobin: 12.5 g/dL (ref 12.0–15.0)
MCH: 30 pg (ref 26.0–34.0)
MCHC: 33 g/dL (ref 30.0–36.0)
MCV: 91.1 fL (ref 80.0–100.0)
Platelets: 177 10*3/uL (ref 150–400)
RBC: 4.16 MIL/uL (ref 3.87–5.11)
RDW: 13.5 % (ref 11.5–15.5)
WBC: 6.9 10*3/uL (ref 4.0–10.5)
nRBC: 0 % (ref 0.0–0.2)

## 2023-04-10 LAB — APTT
aPTT: 97 seconds — ABNORMAL HIGH (ref 24–36)
aPTT: 90 seconds — ABNORMAL HIGH (ref 24–36)

## 2023-04-10 LAB — HEPARIN LEVEL (UNFRACTIONATED): Heparin Unfractionated: 0.77 IU/mL — ABNORMAL HIGH (ref 0.30–0.70)

## 2023-04-10 MED ORDER — ASPIRIN 81 MG PO CHEW
81.0000 mg | CHEWABLE_TABLET | ORAL | Status: AC
  Administered 2023-04-11: 81 mg via ORAL
  Filled 2023-04-10: qty 1

## 2023-04-10 MED ORDER — SODIUM CHLORIDE 0.9 % IV SOLN: INTRAVENOUS | Status: DC

## 2023-04-10 NOTE — Plan of Care (Signed)
  Problem: Education: Goal: Knowledge of General Education information will improve Description: Including pain rating scale, medication(s)/side effects and non-pharmacologic comfort measures Outcome: Progressing   Problem: Health Behavior/Discharge Planning: Goal: Ability to manage health-related needs will improve Outcome: Progressing   Problem: Clinical Measurements: Goal: Ability to maintain clinical measurements within normal limits will improve Outcome: Progressing Goal: Will remain free from infection Outcome: Progressing Goal: Diagnostic test results will improve Outcome: Progressing   Problem: Activity: Goal: Risk for activity intolerance will decrease Outcome: Progressing   Problem: Nutrition: Goal: Adequate nutrition will be maintained Outcome: Progressing   Problem: Coping: Goal: Level of anxiety will decrease Outcome: Progressing   Problem: Safety: Goal: Ability to remain free from injury will improve Outcome: Progressing   Problem: Skin Integrity: Goal: Risk for impaired skin integrity will decrease Outcome: Progressing   Problem: Activity: Goal: Ability to return to baseline activity level will improve Outcome: Progressing   Problem: Cardiovascular: Goal: Ability to achieve and maintain adequate cardiovascular perfusion will improve Outcome: Progressing

## 2023-04-10 NOTE — Consult Note (Signed)
ANTICOAGULATION CONSULT NOTE  Pharmacy Consult for Heparin Infusion Indication: chest pain/ACS  No Known Allergies  Patient Measurements: Height: 5' (152.4 cm) Weight: 54.4 kg (120 lb) IBW/kg (Calculated) : 45.5 Heparin Dosing Weight: 54.4 kg  Vital Signs: Temp: 99.3 F (37.4 C) (09/21 2000) Temp Source: Oral (09/21 2000) BP: 115/44 (09/21 2000) Pulse Rate: 70 (09/21 2000)  Labs: Recent Labs    04/09/23 0006 04/09/23 0145 04/09/23 0625 04/09/23 0749 04/09/23 0826 04/09/23 1803 04/10/23 0254  HGB 13.8  --   --   --   --   --  12.5  HCT 43.1  --   --   --   --   --  37.9  PLT 200  --   --   --   --   --  177  APTT  --   --   --   --  31 63* 97*  LABPROT 12.6  --   --   --   --   --   --   INR 0.9  --   --   --   --   --   --   HEPARINUNFRC  --   --   --   --  0.79*  --  0.77*  CREATININE 0.95  --   --   --   --   --   --   TROPONINIHS 27* 116* 6,206* 10,271*  --   --   --     Estimated Creatinine Clearance: 28.8 mL/min (by C-G formula based on SCr of 0.95 mg/dL).   Medical History: Past Medical History:  Diagnosis Date   Benign neoplasm of colon, unspecified    Carotid artery disease (HCC)    Diverticulosis    Hyperlipemia    Hypertension    Hypertensive kidney disease with CKD stage III (HCC)    Lesion of ulnar nerve    Osteoarthritis    Valvular heart disease    a. 02/2020 Echo: EF 50%, triv AI, mild AS/MR/MS/TR.   Assessment: Hannah Hooper is a 89 y.o. female presenting with palpitations and chest tightness. PMH significant for AF, HTN, HLD, CAD, CKD3, diverticulosis. Patient was on [facility] PTA per chart review. Last dose of apixaban 2.5 was 9/21 at 0253. Pharmacy has been consulted to initiate and manage heparin infusion.   Baseline Labs: aPTT 31, HL 0.79, PT 12.6, INR 0.9, Hgb 13.8, Hct 43.1, Plt 200   Goal of Therapy:  Heparin level 0.3-0.7 units/ml aPTT 66-102 seconds Monitor platelets by anticoagulation protocol: Yes   Date Time aPTT/HL Rate/Comment   9/21 1803 63/---  650/subtherapeutic 9/22 0254 97 / 0.77 750/therapeutic x 1  Plan:  Continue heparin infusion at 750 units/hr Recheck aPTT in 8 hours to confirm Continue to monitor aPTT until HL and aPTT correlate.  Check HL daily for correlation until HL and aPTT correlate. Switch to HL monitoring once HL and aPTT correlate.  Continue to monitor H&H and platelets daily while on heparin infusion    Otelia Sergeant, PharmD, [facility] 04/10/2023 3:37 AM

## 2023-04-10 NOTE — Progress Notes (Signed)
Attempted to get patient to sign operative consent for cardiac cath in am.  Patient states "no one has went over the procedure with me.  I will sign when Dr. Katrinka Blazing this to me."

## 2023-04-10 NOTE — Consult Note (Signed)
ANTICOAGULATION CONSULT NOTE  Pharmacy Consult for Heparin Infusion Indication: chest pain/ACS  No Known Allergies  Patient Measurements: Height: 5' (152.4 cm) Weight: 54.4 kg (120 lb) IBW/kg (Calculated) : 45.5 Heparin Dosing Weight: 54.4 kg  Vital Signs: Temp: 97.6 F (36.4 C) (09/22 1212) Temp Source: Oral (09/22 1212) BP: 141/62 (09/22 1212) Pulse Rate: 63 (09/22 0400)  Labs: Recent Labs    04/09/23 0006 04/09/23 0145 04/09/23 0749 04/09/23 0826 04/09/23 0826 04/09/23 1803 04/10/23 0254 04/10/23 0801 04/10/23 0922 04/10/23 1122  HGB 13.8  --   --   --   --   --  12.5  --   --   --   HCT 43.1  --   --   --   --   --  37.9  --   --   --   PLT 200  --   --   --   --   --  177  --   --   --   APTT  --   --   --  31   < > 63* 97*  --   --  90*  LABPROT 12.6  --   --   --   --   --   --   --   --   --   INR 0.9  --   --   --   --   --   --   --   --   --   HEPARINUNFRC  --   --   --  0.79*  --   --  0.77*  --   --   --   CREATININE 0.95  --   --   --   --   --   --   --   --   --   TROPONINIHS 27*   < > 10,271*  --   --   --   --  5,738* 4,929*  --    < > = values in this interval not displayed.    Estimated Creatinine Clearance: 28.8 mL/min (by C-G formula based on SCr of 0.95 mg/dL).   Medical History: Past Medical History:  Diagnosis Date   Benign neoplasm of colon, unspecified    Carotid artery disease (HCC)    Diverticulosis    Hyperlipemia    Hypertension    Hypertensive kidney disease with CKD stage III (HCC)    Lesion of ulnar nerve    Osteoarthritis    Valvular heart disease    a. 02/2020 Echo: EF 50%, triv AI, mild AS/MR/MS/TR.   Assessment: Hannah Hooper is a 89 y.o. female presenting with palpitations and chest tightness. PMH significant for AF, HTN, HLD, CAD, CKD3, diverticulosis. Patient was on [facility] PTA per chart review. Last dose of apixaban 2.5 was 9/21 at 0253. Pharmacy has been consulted to initiate and manage heparin infusion.   Baseline  Labs: aPTT 31, HL 0.79, PT 12.6, INR 0.9, Hgb 13.8, Hct 43.1, Plt 200   Goal of Therapy:  Heparin level 0.3-0.7 units/ml aPTT 66-102 seconds Monitor platelets by anticoagulation protocol: Yes   Date Time aPTT/HL Rate/Comment  9/21 1803 63/---  650/subtherapeutic 9/22 0254 97 / 0.77 750/therapeutic x 1 9/22 1122 90  750/therapeutic   Plan:  Heparin remains therapeutic Continue heparin infusion at 750 units/hr Recheck aPTT/HL with AM labs  Continue to monitor aPTT until HL and aPTT correlate.  Continue to monitor H&H and platelets daily while on heparin infusion  Sharen Hones, PharmD, BCPS Clinical Pharmacist   04/10/2023 12:14 PM

## 2023-04-10 NOTE — Progress Notes (Signed)
Cardiology Progress Note  Patient ID: Hannah Hooper MRN: [id number] DOB: 1932-02-26 Date of Encounter: 04/10/2023  Primary Cardiologist: None  Subjective   Chief Complaint: None.   HPI: Reports no chest pain or trouble breathing.  Brief SVT overnight but no recurrence of atrial fibrillation.  Family at bedside and were updated on plan.  ROS:  All other ROS reviewed and negative. Pertinent positives noted in the HPI.     Inpatient Medications  Scheduled Meds:  aspirin  81 mg Oral Daily   atorvastatin  80 mg Oral Daily   metoprolol tartrate  50 mg Oral BID   Continuous Infusions:  heparin 750 Units/hr (04/09/23 1856)   PRN Meds: acetaminophen, magnesium hydroxide, ondansetron (ZOFRAN) IV, traZODone   Vital Signs   Vitals:   04/10/23 0000 04/10/23 0400 04/10/23 0900 04/10/23 1212  BP: (!) 162/72 (!) 141/55  (!) 141/62  Pulse: (!) 57 63    Resp: 15 19    Temp: 98 F (36.7 C) 98.1 F (36.7 C) 98.3 F (36.8 C) 97.6 F (36.4 C)  TempSrc: Oral Oral Oral Oral  SpO2: 99% 94%  97%  Weight:      Height:        Intake/Output Summary (Last 24 hours) at 04/10/2023 1242 Last data filed at 04/10/2023 0552 Gross per 24 hour  Intake 139.21 ml  Output --  Net 139.21 ml      04/09/2023   12:01 AM 03/02/2018    9:17 AM 06/24/2016    9:22 AM  Last 3 Weights  Weight (lbs) 120 lb 115 lb 126 lb  Weight (kg) 54.432 kg 52.164 kg 57.153 kg      Telemetry  Overnight telemetry shows sinus bradycardia 50s, brief SVT, which I personally reviewed.   Physical Exam   Vitals:   04/10/23 0000 04/10/23 0400 04/10/23 0900 04/10/23 1212  BP: (!) 162/72 (!) 141/55  (!) 141/62  Pulse: (!) 57 63    Resp: 15 19    Temp: 98 F (36.7 C) 98.1 F (36.7 C) 98.3 F (36.8 C) 97.6 F (36.4 C)  TempSrc: Oral Oral Oral Oral  SpO2: 99% 94%  97%  Weight:      Height:        Intake/Output Summary (Last 24 hours) at 04/10/2023 1242 Last data filed at 04/10/2023 9563 Gross per 24 hour  Intake 139.21  ml  Output --  Net 139.21 ml       04/09/2023   12:01 AM 03/02/2018    9:17 AM 06/24/2016    9:22 AM  Last 3 Weights  Weight (lbs) 120 lb 115 lb 126 lb  Weight (kg) 54.432 kg 52.164 kg 57.153 kg    Body mass index is 23.44 kg/m.  General: Well nourished, well developed, in no acute distress Head: Atraumatic, normal size  Eyes: PEERLA, EOMI  Neck: Supple, no JVD Endocrine: No thryomegaly Cardiac: Normal S1, 3-6 systolic ejection murmur, late peaking, absent S2 Lungs: Clear to auscultation bilaterally, no wheezing, rhonchi or rales  Abd: Soft, nontender, no hepatomegaly  Ext: No edema, pulses 2+ Musculoskeletal: No deformities, BUE and BLE strength normal and equal Skin: Warm and dry, no rashes   Neuro: Alert and oriented to person, place, time, and situation, CNII-XII grossly intact, no focal deficits  Psych: Normal mood and affect   Labs  High Sensitivity Troponin:   Recent Labs  Lab 04/09/23 0145 04/09/23 0625 04/09/23 0749 04/10/23 0801 04/10/23 0922  TROPONINIHS 116* 6,206* 10,271* 5,738* 4,929*  Cardiac EnzymesNo results for input(s): "TROPONINI" in the last 168 hours. No results for input(s): "TROPIPOC" in the last 168 hours.  Chemistry Recent Labs  Lab 04/09/23 0006  NA 139  K 3.4*  CL 102  CO2 23  GLUCOSE 204*  BUN 21  CREATININE 0.95  CALCIUM 8.9  PROT 6.6  ALBUMIN 3.9  AST 27  ALT 25  ALKPHOS 60  BILITOT 0.8  GFRNONAA 57*  ANIONGAP 14    Hematology Recent Labs  Lab 04/09/23 0006 04/10/23 0254  WBC 6.5 6.9  RBC 4.70 4.16  HGB 13.8 12.5  HCT 43.1 37.9  MCV 91.7 91.1  MCH 29.4 30.0  MCHC 32.0 33.0  RDW 13.2 13.5  PLT 200 177   BNP Recent Labs  Lab 04/09/23 0006  BNP 574.6*    DDimer  Recent Labs  Lab 04/09/23 0006  DDIMER 0.56*     Radiology  US Carotid Bilateral  Result Date: 04/09/2023 CLINICAL DATA:  Bilateral carotid bruits, hypertension EXAM: BILATERAL CAROTID DUPLEX ULTRASOUND TECHNIQUE: Wallace Cullens scale imaging, color  Doppler and duplex ultrasound were performed of bilateral carotid and vertebral arteries in the neck. COMPARISON:  CTA 05/02/2007 FINDINGS: Criteria: Quantification of carotid stenosis is based on velocity parameters that correlate the residual internal carotid diameter with NASCET-based stenosis levels, using the diameter of the distal internal carotid lumen as the denominator for stenosis measurement. The following velocity measurements were obtained: RIGHT ICA: 299/51 cm/sec CCA: 72/10 cm/sec SYSTOLIC ICA/CCA RATIO:  4.2 ECA: 117 cm/sec LEFT ICA: 159/35 cm/sec CCA: 37/9 cm/sec SYSTOLIC ICA/CCA RATIO:  4.3 ECA: 65 cm/sec RIGHT CAROTID ARTERY: Intimal thickening in the common carotid. Calcified plaque in the bulb, proximal ECA, and ICA origin with markedly elevated peak systolic velocities in the proximal ICA with spectral broadening, and decreased systolic upstroke distally. RIGHT VERTEBRAL ARTERY: Patent with antegrade flow, delayed systolic upstroke. LEFT CAROTID ARTERY: Mild tortuosity. Calcified plaque in the bulb, proximal ECA, and ICA origin resulting in at least mild stenosis. No focal aliasing. There is a delayed systolic upstroke in the mid and distal ICA. LEFT VERTEBRAL ARTERY:  Normal flow direction and waveform. IMPRESSION: 1. 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 2. Left carotid bifurcation plaque resulting in 50-69% ICA stenosis. 3. Patent vertebral arteries with antegrade flow. Electronically Signed   By: Corlis Leak M.D.   On: 04/09/2023 20:10   DG Chest Port 1 View  Result Date: 04/09/2023 CLINICAL DATA:  Chest pain EXAM: PORTABLE CHEST 1 VIEW COMPARISON:  None Available. FINDINGS: The heart size and mediastinal contours are within normal limits. Both lungs are clear. The visualized skeletal structures are unremarkable. IMPRESSION: No active disease. Electronically Signed   By: Jasmine Pang M.D.   On: 04/09/2023 00:31    Cardiac Studies  Carotid  ultrasound 04/09/2023 IMPRESSION: 1. 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 2. Left carotid bifurcation plaque resulting in 50-69% ICA stenosis. 3. Patent vertebral arteries with antegrade flow.  Patient Profile  89 year old female with history of dementia, aortic stenosis, hypertension, hyperlipidemia admitted 04/08/2023 for A-fib with RVR and non-STEMI.   Assessment & Plan   # Non-STEMI -Admitted with non-STEMI in the setting of rapid A-fib.  EKG changes were dynamic and profound. -Has converted back to sinus rhythm.  EKG changes have resolved. -Troponin is trending down.  No chest pain reported.  BNP elevated.  Suspect she has an element of congestive heart failure.  Does not appear volume overloaded.  Holding on diuresis. -Examination also concerning for severe aortic stenosis. -Likely non-STEMI could be secondary to A-fib and severe aortic stenosis.  Her echo is pending. -Given troponin elevation and symptoms of chest discomfort and rapid A-fib and elevated biomarkers I recommended right and left heart catheterization.  She is willing to proceed.  Will plan for this tomorrow.  N.p.o. at midnight. -Continue aspirin statin and heparin drip.  She is on metoprolol tartrate 50 mg twice daily.  Informed Consent   Shared Decision Making/Informed Consent The risks [stroke (1 in 1000), death (1 in 1000), kidney failure [usually temporary] (1 in 500), bleeding (1 in 200), allergic reaction [possibly serious] (1 in 200)], benefits (diagnostic support and management of coronary artery disease) and alternatives of a cardiac catheterization were discussed in detail with Ms. Petti and she is willing to proceed.      # Elevated BNP -Could be related to rapid A-fib.  Does not appear volume up.  Right and left heart catheterization tomorrow.  # Paroxysmal atrial fibrillation -Was briefly on diltiazem drip.  Has converted back to rhythm.  Continue metoprolol  tartrate 50 mg twice daily. -On heparin drip.  Will need transition to DOAC after procedures.  # Aortic stenosis, prominent murmur -Suspect her aortic stenosis has progressed.  Could be contributing.  Repeat echo is pending.  # Dementia -Appears mild per discussion with family.  She is still living at home and is quite active.  I do not believe this precludes her from procedures.      For questions or updates, please contact [facility] Please consult [email or web address] for contact info under        Signed, Gerri Spore T. Flora Lipps, MD, [facility]  [facility]  04/10/2023 12:42 PM

## 2023-04-10 NOTE — Progress Notes (Signed)
Progress Note   Patient: Hannah Hooper DGL:[id number] DOB: December 18, 1931 DOA: 04/08/2023     1 DOS: the patient was seen and examined on 04/10/2023   Brief hospital course: Hannah Hooper is a 89 y.o. Caucasian female with medical history significant for paroxysmal atrial fibrillation, hypertension, dyslipidemia, diverticulosis, coronary artery disease and stage III chronic kidney disease, who presented to the emergency room with acute onset of palpitations with associated chest pain felt this tightness that started at 10 PM tonight.  Patient's troponin bumped to 10,000, started on heparin drip, seen by cardiologist who recommended to continue rate control medication, beta-blocker therapy and possible heart catheterization.  Assessment and Plan: *NSTEMI- Patient's troponin bumped to 10,000, downtrending now. Echocardiogram pending.  EKG showed rapid A-fib, no acute ST-T wave changes. Continue heparin drip as per weight-based protocol. Monitor PT APTT. Continue aspirin, statin, beta-blocker therapy. Cardiology recommended heart catheterization procedure.  N.p.o. past midnight.  Atrial fibrillation with rapid ventricular response (HCC) Her heart rate improved, converted back to sinus rhythm. Cardizem drip changed to metoprolol therapy. Continue heparin drip as per pharmacy protocol Will optimize electrolytes. 2D echo pending. Cardiology evaluation appreciated.  Hypokalemia Post replacement.  Will repeat labs tomorrow..  Elevated brain natriuretic peptide (BNP) level D-dimer elevated Chest x-ray showed no evidence for CHF. No clinical signs of CHF or fluid overload Elevated BNP in the setting of A-fib.  Will follow cardiology recommendations  Aortic stenosis: Check echocardiogram. Follow cardiology recommendations.  CKD stage 3 A- Kidney function stable. Monitor daily renal function. Avoid nephrotoxic drugs.  PT OT evaluation Out of bed to chair. Incentive spirometry. Nursing  supportive care. Fall, aspiration precautions. DVT prophylaxis heparin drip   Code Status: Full Code  Subjective: Patient is seen and examined today morning.  She is lying comfortably.  Denies any chest pain or palpitations.  Did not get out of bed.  Eating fair.  Physical Exam: Vitals:   04/10/23 0400 04/10/23 0900 04/10/23 1212 04/10/23 1748  BP: (!) 141/55  (!) 141/62 (!) 147/61  Pulse: 63     Resp: 19     Temp: 98.1 F (36.7 C) 98.3 F (36.8 C) 97.6 F (36.4 C) 98.1 F (36.7 C)  TempSrc: Oral Oral Oral Oral  SpO2: 94%  97%   Weight:      Height:        General - Elderly Caucasian female, no apparent distress HEENT - PERRLA, EOMI, atraumatic head, non tender sinuses. Lung - Clear, no rales, rhonchi, wheezes. Heart - S1, S2 heard, no murmurs, rubs, trace pedal edema. Abdomen - Soft, non tender, non distended, bowel sounds good Neuro - Alert, awake and oriented x 3, non focal exam. Skin - Warm and dry.  Data Reviewed:      Latest Ref Rng & Units 04/10/2023    2:54 AM 04/09/2023   12:06 AM  CBC  WBC 4.0 - 10.5 K/uL 6.9  6.5   Hemoglobin 12.0 - 15.0 g/dL 51.8  84.1   Hematocrit 36.0 - 46.0 % 37.9  43.1   Platelets 150 - 400 K/uL 177  200       Latest Ref Rng & Units 04/09/2023   12:06 AM 03/02/2018    9:38 AM 06/17/2016    2:20 PM  BMP  Glucose 70 - 99 mg/dL 660  99  630   BUN 8 - 23 mg/dL 21  17  23    Creatinine 0.44 - 1.00 mg/dL 1.60  1.09  3.23   Sodium 135 -  145 mmol/L 139  140  139   Potassium 3.5 - 5.1 mmol/L 3.4  4.0  4.2   Chloride 98 - 111 mmol/L 102  106  104   CO2 22 - 32 mmol/L 23  25  29    Calcium 8.9 - 10.3 mg/dL 8.9  9.6  9.5    US Carotid Bilateral  Result Date: 04/09/2023 CLINICAL DATA:  Bilateral carotid bruits, hypertension EXAM: BILATERAL CAROTID DUPLEX ULTRASOUND TECHNIQUE: Wallace Cullens scale imaging, color Doppler and duplex ultrasound were performed of bilateral carotid and vertebral arteries in the neck. COMPARISON:  CTA 05/02/2007 FINDINGS:  Criteria: Quantification of carotid stenosis is based on velocity parameters that correlate the residual internal carotid diameter with NASCET-based stenosis levels, using the diameter of the distal internal carotid lumen as the denominator for stenosis measurement. The following velocity measurements were obtained: RIGHT ICA: 299/51 cm/sec CCA: 72/10 cm/sec SYSTOLIC ICA/CCA RATIO:  4.2 ECA: 117 cm/sec LEFT ICA: 159/35 cm/sec CCA: 37/9 cm/sec SYSTOLIC ICA/CCA RATIO:  4.3 ECA: 65 cm/sec RIGHT CAROTID ARTERY: Intimal thickening in the common carotid. Calcified plaque in the bulb, proximal ECA, and ICA origin with markedly elevated peak systolic velocities in the proximal ICA with spectral broadening, and decreased systolic upstroke distally. RIGHT VERTEBRAL ARTERY: Patent with antegrade flow, delayed systolic upstroke. LEFT CAROTID ARTERY: Mild tortuosity. Calcified plaque in the bulb, proximal ECA, and ICA origin resulting in at least mild stenosis. No focal aliasing. There is a delayed systolic upstroke in the mid and distal ICA. LEFT VERTEBRAL ARTERY:  Normal flow direction and waveform. IMPRESSION: 1. 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 2. Left carotid bifurcation plaque resulting in 50-69% ICA stenosis. 3. Patent vertebral arteries with antegrade flow. Electronically Signed   By: Corlis Leak M.D.   On: 04/09/2023 20:10   DG Chest Port 1 View  Result Date: 04/09/2023 CLINICAL DATA:  Chest pain EXAM: PORTABLE CHEST 1 VIEW COMPARISON:  None Available. FINDINGS: The heart size and mediastinal contours are within normal limits. Both lungs are clear. The visualized skeletal structures are unremarkable. IMPRESSION: No active disease. Electronically Signed   By: Jasmine Pang M.D.   On: 04/09/2023 00:31     Family Communication: Family at bedside, updated regarding the care, the understand and agree.   Disposition: Status is: Inpatient Remains inpatient appropriate  because: elevated troponin, heparin drip, rate control.  Planned Discharge Destination: Home with Home Health     Time spent: 42 minutes  Author: Marcelino Duster, MD 04/10/2023 5:53 PM Secure chat 7am to 7pm For on call review [email or web address].

## 2023-04-10 NOTE — H&P (View-Only) (Signed)
Cardiology Progress Note  Patient ID: Hannah Hooper MRN: [id number] DOB: 1932-02-26 Date of Encounter: 04/10/2023  Primary Cardiologist: None  Subjective   Chief Complaint: None.   HPI: Reports no chest pain or trouble breathing.  Brief SVT overnight but no recurrence of atrial fibrillation.  Family at bedside and were updated on plan.  ROS:  All other ROS reviewed and negative. Pertinent positives noted in the HPI.     Inpatient Medications  Scheduled Meds:  aspirin  81 mg Oral Daily   atorvastatin  80 mg Oral Daily   metoprolol tartrate  50 mg Oral BID   Continuous Infusions:  heparin 750 Units/hr (04/09/23 1856)   PRN Meds: acetaminophen, magnesium hydroxide, ondansetron (ZOFRAN) IV, traZODone   Vital Signs   Vitals:   04/10/23 0000 04/10/23 0400 04/10/23 0900 04/10/23 1212  BP: (!) 162/72 (!) 141/55  (!) 141/62  Pulse: (!) 57 63    Resp: 15 19    Temp: 98 F (36.7 C) 98.1 F (36.7 C) 98.3 F (36.8 C) 97.6 F (36.4 C)  TempSrc: Oral Oral Oral Oral  SpO2: 99% 94%  97%  Weight:      Height:        Intake/Output Summary (Last 24 hours) at 04/10/2023 1242 Last data filed at 04/10/2023 0552 Gross per 24 hour  Intake 139.21 ml  Output --  Net 139.21 ml      04/09/2023   12:01 AM 03/02/2018    9:17 AM 06/24/2016    9:22 AM  Last 3 Weights  Weight (lbs) 120 lb 115 lb 126 lb  Weight (kg) 54.432 kg 52.164 kg 57.153 kg      Telemetry  Overnight telemetry shows sinus bradycardia 50s, brief SVT, which I personally reviewed.   Physical Exam   Vitals:   04/10/23 0000 04/10/23 0400 04/10/23 0900 04/10/23 1212  BP: (!) 162/72 (!) 141/55  (!) 141/62  Pulse: (!) 57 63    Resp: 15 19    Temp: 98 F (36.7 C) 98.1 F (36.7 C) 98.3 F (36.8 C) 97.6 F (36.4 C)  TempSrc: Oral Oral Oral Oral  SpO2: 99% 94%  97%  Weight:      Height:        Intake/Output Summary (Last 24 hours) at 04/10/2023 1242 Last data filed at 04/10/2023 9563 Gross per 24 hour  Intake 139.21  ml  Output --  Net 139.21 ml       04/09/2023   12:01 AM 03/02/2018    9:17 AM 06/24/2016    9:22 AM  Last 3 Weights  Weight (lbs) 120 lb 115 lb 126 lb  Weight (kg) 54.432 kg 52.164 kg 57.153 kg    Body mass index is 23.44 kg/m.  General: Well nourished, well developed, in no acute distress Head: Atraumatic, normal size  Eyes: PEERLA, EOMI  Neck: Supple, no JVD Endocrine: No thryomegaly Cardiac: Normal S1, 3-6 systolic ejection murmur, late peaking, absent S2 Lungs: Clear to auscultation bilaterally, no wheezing, rhonchi or rales  Abd: Soft, nontender, no hepatomegaly  Ext: No edema, pulses 2+ Musculoskeletal: No deformities, BUE and BLE strength normal and equal Skin: Warm and dry, no rashes   Neuro: Alert and oriented to person, place, time, and situation, CNII-XII grossly intact, no focal deficits  Psych: Normal mood and affect   Labs  High Sensitivity Troponin:   Recent Labs  Lab 04/09/23 0145 04/09/23 0625 04/09/23 0749 04/10/23 0801 04/10/23 0922  TROPONINIHS 116* 6,206* 10,271* 5,738* 4,929*  Cardiac EnzymesNo results for input(s): "TROPONINI" in the last 168 hours. No results for input(s): "TROPIPOC" in the last 168 hours.  Chemistry Recent Labs  Lab 04/09/23 0006  NA 139  K 3.4*  CL 102  CO2 23  GLUCOSE 204*  BUN 21  CREATININE 0.95  CALCIUM 8.9  PROT 6.6  ALBUMIN 3.9  AST 27  ALT 25  ALKPHOS 60  BILITOT 0.8  GFRNONAA 57*  ANIONGAP 14    Hematology Recent Labs  Lab 04/09/23 0006 04/10/23 0254  WBC 6.5 6.9  RBC 4.70 4.16  HGB 13.8 12.5  HCT 43.1 37.9  MCV 91.7 91.1  MCH 29.4 30.0  MCHC 32.0 33.0  RDW 13.2 13.5  PLT 200 177   BNP Recent Labs  Lab 04/09/23 0006  BNP 574.6*    DDimer  Recent Labs  Lab 04/09/23 0006  DDIMER 0.56*     Radiology  US Carotid Bilateral  Result Date: 04/09/2023 CLINICAL DATA:  Bilateral carotid bruits, hypertension EXAM: BILATERAL CAROTID DUPLEX ULTRASOUND TECHNIQUE: Wallace Cullens scale imaging, color  Doppler and duplex ultrasound were performed of bilateral carotid and vertebral arteries in the neck. COMPARISON:  CTA 05/02/2007 FINDINGS: Criteria: Quantification of carotid stenosis is based on velocity parameters that correlate the residual internal carotid diameter with NASCET-based stenosis levels, using the diameter of the distal internal carotid lumen as the denominator for stenosis measurement. The following velocity measurements were obtained: RIGHT ICA: 299/51 cm/sec CCA: 72/10 cm/sec SYSTOLIC ICA/CCA RATIO:  4.2 ECA: 117 cm/sec LEFT ICA: 159/35 cm/sec CCA: 37/9 cm/sec SYSTOLIC ICA/CCA RATIO:  4.3 ECA: 65 cm/sec RIGHT CAROTID ARTERY: Intimal thickening in the common carotid. Calcified plaque in the bulb, proximal ECA, and ICA origin with markedly elevated peak systolic velocities in the proximal ICA with spectral broadening, and decreased systolic upstroke distally. RIGHT VERTEBRAL ARTERY: Patent with antegrade flow, delayed systolic upstroke. LEFT CAROTID ARTERY: Mild tortuosity. Calcified plaque in the bulb, proximal ECA, and ICA origin resulting in at least mild stenosis. No focal aliasing. There is a delayed systolic upstroke in the mid and distal ICA. LEFT VERTEBRAL ARTERY:  Normal flow direction and waveform. IMPRESSION: 1. 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 2. Left carotid bifurcation plaque resulting in 50-69% ICA stenosis. 3. Patent vertebral arteries with antegrade flow. Electronically Signed   By: Corlis Leak M.D.   On: 04/09/2023 20:10   DG Chest Port 1 View  Result Date: 04/09/2023 CLINICAL DATA:  Chest pain EXAM: PORTABLE CHEST 1 VIEW COMPARISON:  None Available. FINDINGS: The heart size and mediastinal contours are within normal limits. Both lungs are clear. The visualized skeletal structures are unremarkable. IMPRESSION: No active disease. Electronically Signed   By: Jasmine Pang M.D.   On: 04/09/2023 00:31    Cardiac Studies  Carotid  ultrasound 04/09/2023 IMPRESSION: 1. 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 2. Left carotid bifurcation plaque resulting in 50-69% ICA stenosis. 3. Patent vertebral arteries with antegrade flow.  Patient Profile  89 year old female with history of dementia, aortic stenosis, hypertension, hyperlipidemia admitted 04/08/2023 for A-fib with RVR and non-STEMI.   Assessment & Plan   # Non-STEMI -Admitted with non-STEMI in the setting of rapid A-fib.  EKG changes were dynamic and profound. -Has converted back to sinus rhythm.  EKG changes have resolved. -Troponin is trending down.  No chest pain reported.  BNP elevated.  Suspect she has an element of congestive heart failure.  Does not appear volume overloaded.  Holding on diuresis. -Examination also concerning for severe aortic stenosis. -Likely non-STEMI could be secondary to A-fib and severe aortic stenosis.  Her echo is pending. -Given troponin elevation and symptoms of chest discomfort and rapid A-fib and elevated biomarkers I recommended right and left heart catheterization.  She is willing to proceed.  Will plan for this tomorrow.  N.p.o. at midnight. -Continue aspirin statin and heparin drip.  She is on metoprolol tartrate 50 mg twice daily.  Informed Consent   Shared Decision Making/Informed Consent The risks [stroke (1 in 1000), death (1 in 1000), kidney failure [usually temporary] (1 in 500), bleeding (1 in 200), allergic reaction [possibly serious] (1 in 200)], benefits (diagnostic support and management of coronary artery disease) and alternatives of a cardiac catheterization were discussed in detail with Hannah Hooper and she is willing to proceed.      # Elevated BNP -Could be related to rapid A-fib.  Does not appear volume up.  Right and left heart catheterization tomorrow.  # Paroxysmal atrial fibrillation -Was briefly on diltiazem drip.  Has converted back to rhythm.  Continue metoprolol  tartrate 50 mg twice daily. -On heparin drip.  Will need transition to DOAC after procedures.  # Aortic stenosis, prominent murmur -Suspect her aortic stenosis has progressed.  Could be contributing.  Repeat echo is pending.  # Dementia -Appears mild per discussion with family.  She is still living at home and is quite active.  I do not believe this precludes her from procedures.      For questions or updates, please contact [facility] Please consult [email or web address] for contact info under        Signed, Gerri Spore T. Flora Lipps, MD, [facility]  [facility]  04/10/2023 12:42 PM

## 2023-04-11 ENCOUNTER — Other Ambulatory Visit (HOSPITAL_COMMUNITY): Payer: Self-pay

## 2023-04-11 ENCOUNTER — Inpatient Hospital Stay (HOSPITAL_COMMUNITY)
Admit: 2023-04-11 | Discharge: 2023-04-11 | Disposition: A | Discharge status: Home / Self Care | Payer: Medicare (Managed Care) | Attending: Cardiovascular Disease | Admitting: Cardiovascular Disease

## 2023-04-11 ENCOUNTER — Encounter
Admission: EM | Disposition: A | Discharge status: Home / Self Care | Payer: Self-pay | Source: Home / Self Care | Attending: Internal Medicine

## 2023-04-11 DIAGNOSIS — R7989 Other specified abnormal findings of blood chemistry: Secondary | ICD-10-CM | POA: Diagnosis not present

## 2023-04-11 DIAGNOSIS — I480 Paroxysmal atrial fibrillation: Secondary | ICD-10-CM | POA: Diagnosis not present

## 2023-04-11 DIAGNOSIS — I4891 Unspecified atrial fibrillation: Secondary | ICD-10-CM

## 2023-04-11 DIAGNOSIS — I350 Nonrheumatic aortic (valve) stenosis: Secondary | ICD-10-CM

## 2023-04-11 DIAGNOSIS — I214 Non-ST elevation (NSTEMI) myocardial infarction: Secondary | ICD-10-CM | POA: Diagnosis not present

## 2023-04-11 DIAGNOSIS — I2510 Atherosclerotic heart disease of native coronary artery without angina pectoris: Secondary | ICD-10-CM | POA: Diagnosis not present

## 2023-04-11 DIAGNOSIS — E876 Hypokalemia: Secondary | ICD-10-CM | POA: Diagnosis not present

## 2023-04-11 HISTORY — PX: RIGHT/LEFT HEART CATH AND CORONARY ANGIOGRAPHY: CATH118266

## 2023-04-11 LAB — ECHOCARDIOGRAM COMPLETE
AR max vel: 1.29 cm2
AV Area VTI: 1.5 cm2
AV Area mean vel: 1.24 cm2
AV Mean grad: 31 mmHg
AV Peak grad: 43.3 mmHg
Ao pk vel: 3.29 m/s
Area-P 1/2: 2.63 cm2
Height: 60 in
MV M vel: 6.14 m/s
MV Peak grad: 150.8 mmHg
MV VTI: 2.09 cm2
Radius: 0.8 cm
S' Lateral: 2.7 cm
Weight: 1920 [oz_av]

## 2023-04-11 LAB — POCT I-STAT 7, (LYTES, BLD GAS, ICA,H+H)
Acid-Base Excess: 1 mmol/L (ref 0.0–2.0)
Bicarbonate: 26.4 mmol/L (ref 20.0–28.0)
Calcium, Ion: 1.18 mmol/L (ref 1.15–1.40)
HCT: 40 % (ref 36.0–46.0)
Hemoglobin: 13.6 g/dL (ref 12.0–15.0)
O2 Saturation: 91 %
Potassium: 4.1 mmol/L (ref 3.5–5.1)
Sodium: 140 mmol/L (ref 135–145)
TCO2: 28 mmol/L (ref 22–32)
pCO2 arterial: 42.7 mmHg (ref 32–48)
pH, Arterial: 7.399 (ref 7.35–7.45)
pO2, Arterial: 61 mmHg — ABNORMAL LOW (ref 83–108)

## 2023-04-11 LAB — BASIC METABOLIC PANEL
Anion gap: 6 (ref 5–15)
BUN: 17 mg/dL (ref 8–23)
CO2: 26 mmol/L (ref 22–32)
Calcium: 8.5 mg/dL — ABNORMAL LOW (ref 8.9–10.3)
Chloride: 106 mmol/L (ref 98–111)
Creatinine, Ser: 0.99 mg/dL (ref 0.44–1.00)
GFR, Estimated: 55 mL/min — ABNORMAL LOW (ref >60)
Glucose, Bld: 91 mg/dL (ref 70–99)
Potassium: 4.1 mmol/L (ref 3.5–5.1)
Sodium: 138 mmol/L (ref 135–145)

## 2023-04-11 LAB — CBC
HCT: 39.1 % (ref 36.0–46.0)
Hemoglobin: 12.9 g/dL (ref 12.0–15.0)
MCH: 29.5 pg (ref 26.0–34.0)
MCHC: 33 g/dL (ref 30.0–36.0)
MCV: 89.5 fL (ref 80.0–100.0)
Platelets: 186 10*3/uL (ref 150–400)
RBC: 4.37 MIL/uL (ref 3.87–5.11)
RDW: 13.4 % (ref 11.5–15.5)
WBC: 6.5 10*3/uL (ref 4.0–10.5)
nRBC: 0 % (ref 0.0–0.2)

## 2023-04-11 LAB — POCT I-STAT EG7
Acid-Base Excess: 2 mmol/L (ref 0.0–2.0)
Bicarbonate: 28.4 mmol/L — ABNORMAL HIGH (ref 20.0–28.0)
Calcium, Ion: 1.2 mmol/L (ref 1.15–1.40)
HCT: 40 % (ref 36.0–46.0)
Hemoglobin: 13.6 g/dL (ref 12.0–15.0)
O2 Saturation: 64 %
Potassium: 4 mmol/L (ref 3.5–5.1)
Sodium: 140 mmol/L (ref 135–145)
TCO2: 30 mmol/L (ref 22–32)
pCO2, Ven: 48.7 mmHg (ref 44–60)
pH, Ven: 7.373 (ref 7.25–7.43)
pO2, Ven: 35 mmHg (ref 32–45)

## 2023-04-11 LAB — APTT: aPTT: 87 seconds — ABNORMAL HIGH (ref 24–36)

## 2023-04-11 LAB — HEPARIN LEVEL (UNFRACTIONATED): Heparin Unfractionated: 0.47 IU/mL (ref 0.30–0.70)

## 2023-04-11 SURGERY — RIGHT/LEFT HEART CATH AND CORONARY ANGIOGRAPHY
Anesthesia: Moderate Sedation

## 2023-04-11 MED ORDER — HEPARIN SODIUM (PORCINE) 1000 UNIT/ML IJ SOLN
INTRAMUSCULAR | Status: DC | PRN
  Administered 2023-04-11: 2500 [IU] via INTRAVENOUS

## 2023-04-11 MED ORDER — FENTANYL CITRATE (PF) 100 MCG/2ML IJ SOLN
INTRAMUSCULAR | Status: AC
  Filled 2023-04-11: qty 2

## 2023-04-11 MED ORDER — AMLODIPINE BESYLATE 5 MG PO TABS
5.0000 mg | ORAL_TABLET | Freq: Every day | ORAL | Status: DC
  Administered 2023-04-11 – 2023-04-12 (×2): 5 mg via ORAL
  Filled 2023-04-11 (×2): qty 1

## 2023-04-11 MED ORDER — VERAPAMIL HCL 2.5 MG/ML IV SOLN
INTRAVENOUS | Status: DC | PRN
  Administered 2023-04-11: 2.5 mg via INTRA_ARTERIAL

## 2023-04-11 MED ORDER — LIDOCAINE HCL (PF) 1 % IJ SOLN
INTRAMUSCULAR | Status: DC | PRN
  Administered 2023-04-11: 5 mL via SUBCUTANEOUS

## 2023-04-11 MED ORDER — SODIUM CHLORIDE 0.9% FLUSH
3.0000 mL | Freq: Two times a day (BID) | INTRAVENOUS | Status: DC
  Administered 2023-04-11 – 2023-04-12 (×3): 3 mL via INTRAVENOUS

## 2023-04-11 MED ORDER — VERAPAMIL HCL 2.5 MG/ML IV SOLN
INTRAVENOUS | Status: AC
  Filled 2023-04-11: qty 2

## 2023-04-11 MED ORDER — IOHEXOL 300 MG/ML  SOLN
INTRAMUSCULAR | Status: DC | PRN
  Administered 2023-04-11: 78 mL

## 2023-04-11 MED ORDER — HEPARIN SODIUM (PORCINE) 1000 UNIT/ML IJ SOLN
INTRAMUSCULAR | Status: AC
  Filled 2023-04-11: qty 10

## 2023-04-11 MED ORDER — MIDAZOLAM HCL 2 MG/2ML IJ SOLN
INTRAMUSCULAR | Status: DC | PRN
  Administered 2023-04-11: 1 mg via INTRAVENOUS

## 2023-04-11 MED ORDER — MIDAZOLAM HCL 2 MG/2ML IJ SOLN
INTRAMUSCULAR | Status: AC
  Filled 2023-04-11: qty 2

## 2023-04-11 MED ORDER — AMLODIPINE BESYLATE 5 MG PO TABS
ORAL_TABLET | ORAL | Status: AC
  Filled 2023-04-11: qty 1

## 2023-04-11 MED ORDER — SODIUM CHLORIDE 0.9 % IV SOLN: 250.0000 mL | INTRAVENOUS | Status: DC | PRN

## 2023-04-11 MED ORDER — HEPARIN (PORCINE) IN NACL 1000-0.9 UT/500ML-% IV SOLN
INTRAVENOUS | Status: AC
  Filled 2023-04-11: qty 1000

## 2023-04-11 MED ORDER — SODIUM CHLORIDE 0.9% FLUSH: 3.0000 mL | INTRAVENOUS | Status: DC | PRN

## 2023-04-11 MED ORDER — HEPARIN (PORCINE) IN NACL 2000-0.9 UNIT/L-% IV SOLN
INTRAVENOUS | Status: DC | PRN
  Administered 2023-04-11: 1000 mL

## 2023-04-11 MED ORDER — HEPARIN (PORCINE) 25000 UT/250ML-% IV SOLN
750.0000 [IU]/h | INTRAVENOUS | Status: DC
  Administered 2023-04-11: 750 [IU]/h via INTRAVENOUS

## 2023-04-11 SURGICAL SUPPLY — 15 items
CATH 5FR JL3.5 JR4 ANG PIG MP (CATHETERS) IMPLANT
CATH BALLN WEDGE 5F 110CM (CATHETERS) IMPLANT
CATH LAUNCHER 5F EBU3.5 (CATHETERS) IMPLANT
DEVICE RAD TR BAND REGULAR (VASCULAR PRODUCTS) IMPLANT
DRAPE BRACHIAL (DRAPES) IMPLANT
GLIDESHEATH SLEND SS 6F .021 (SHEATH) IMPLANT
GUIDEWIRE INQWIRE 1.5J.035X260 (WIRE) IMPLANT
INQWIRE 1.5J .035X260CM (WIRE) ×1
PACK CARDIAC CATH (CUSTOM PROCEDURE TRAY) ×1 IMPLANT
PROTECTION STATION PRESSURIZED (MISCELLANEOUS) ×1
SET ATX-X65L (MISCELLANEOUS) IMPLANT
SHEATH GLIDE SLENDER 4/5FR (SHEATH) IMPLANT
STATION PROTECTION PRESSURIZED (MISCELLANEOUS) IMPLANT
WIRE EMERALD ST .035X150CM (WIRE) IMPLANT
WIRE HITORQ VERSACORE ST 145CM (WIRE) IMPLANT

## 2023-04-11 NOTE — Consult Note (Signed)
ANTICOAGULATION CONSULT NOTE  Pharmacy Consult for Heparin Infusion Indication: chest pain/ACS  No Known Allergies  Patient Measurements: Height: 5' (152.4 cm) Weight: 54.4 kg (120 lb) IBW/kg (Calculated) : 45.5 Heparin Dosing Weight: 54.4 kg  Vital Signs: Temp: 98.1 F (36.7 C) (09/23 0819) Temp Source: Oral (09/23 0819) BP: 143/51 (09/23 1200) Pulse Rate: 78 (09/23 1200)  Labs: Recent Labs    04/09/23 0006 04/09/23 0145 04/09/23 0749 04/09/23 0826 04/09/23 1803 04/10/23 0254 04/10/23 0801 04/10/23 0922 04/10/23 1122 04/11/23 0357 04/11/23 0923 04/11/23 0925  HGB 13.8  --   --   --   --  12.5  --   --   --  12.9 13.6 13.6  HCT 43.1  --   --   --   --  37.9  --   --   --  39.1 40.0 40.0  PLT 200  --   --   --   --  177  --   --   --  186  --   --   APTT  --   --   --  31   < > 97*  --   --  90* 87*  --   --   LABPROT 12.6  --   --   --   --   --   --   --   --   --   --   --   INR 0.9  --   --   --   --   --   --   --   --   --   --   --   HEPARINUNFRC  --   --   --  0.79*  --  0.77*  --   --   --  0.47  --   --   CREATININE 0.95  --   --   --   --   --   --   --   --  0.99  --   --   TROPONINIHS 27*   < > 10,271*  --   --   --  1,610* 4,929*  --   --   --   --    < > = values in this interval not displayed.    Estimated Creatinine Clearance: 27.7 mL/min (by C-G formula based on SCr of 0.99 mg/dL).   Medical History: Past Medical History:  Diagnosis Date   Benign neoplasm of colon, unspecified    Carotid artery disease (HCC)    Diverticulosis    Hyperlipemia    Hypertension    Hypertensive kidney disease with CKD stage III (HCC)    Lesion of ulnar nerve    Osteoarthritis    Valvular heart disease    a. 02/2020 Echo: EF 50%, triv AI, mild AS/MR/MS/TR.   Assessment: Hannah Hooper is a 89 y.o. female presenting with palpitations and chest tightness. PMH significant for AF, HTN, HLD, CAD, CKD3, diverticulosis. Patient was on [facility] PTA per chart review. Last dose  of apixaban 2.5 was 9/21 at 0253. Pharmacy has been consulted to initiate and manage heparin infusion.   Baseline Labs: aPTT 31, HL 0.79, PT 12.6, INR 0.9, Hgb 13.8, Hct 43.1, Plt 200   Goal of Therapy:  Heparin level 0.3-0.7 units/ml aPTT 66-102 seconds Monitor platelets by anticoagulation protocol: Yes   Date Time aPTT/HL Rate/Comment  9/21 1803 63/---  650/subtherapeutic 9/22 0254 97 / 0.77 750/therapeutic x 1 9/22 1122 90  750/therapeutic  9/23 0357 87 / 0.47 Therapeutic x 3 / HL correlating x 1 9/23 Resumed today 2hrs after bad removed 1420*  Plan:  Continue heparin infusion at 750 units/hr Recheck aPTT/HL with AM labs  Continue to monitor aPTT until HL and aPTT correlate.  Continue to monitor H&H and platelets daily while on heparin infusion    Tzipora Mcinroy Rodriguez-Guzman PharmD, BCPS 04/11/2023 2:55 PM

## 2023-04-11 NOTE — Plan of Care (Signed)

## 2023-04-11 NOTE — Progress Notes (Signed)
Progress Note   Patient: Hannah Hooper:[id number] DOB: 20-Nov-1931 DOA: 04/08/2023     2 DOS: the patient was seen and examined on 04/11/2023   Brief hospital course: CELENIA PARAMO is a 89 y.o. Caucasian female with medical history significant for paroxysmal atrial fibrillation, hypertension, dyslipidemia, diverticulosis, coronary artery disease and stage III chronic kidney disease, who presented to the emergency room with acute onset of palpitations with associated chest pain felt this tightness that started at 10 PM tonight.  Patient's troponin bumped to 10,000, started on heparin drip, seen by cardiologist who recommended to continue rate control medication, beta-blocker therapy.  Patient went for heart cath procedure which showed moderate ostial RCA and left main coronary artery disease, no obstructive disease requiring vascularization.  She has aortic stenosis which will need TAVR procedure evaluation as outpatient  Assessment and Plan: *NSTEMI- Patient's troponin bumped to 10,000, downtrending now. Echocardiogram pending.  EKG showed rapid A-fib, no acute ST-T wave changes. Continue heparin drip as per weight-based protocol with plan to change to her home dose Eliquis.  Monitor PT APTT. Continue aspirin, statin, beta-blocker therapy. S/p cardiac catheterization which showed moderate ostial RCA and left main coronary artery disease. No obstructive disease requiring revascularization.   Atrial fibrillation with rapid ventricular response (HCC) Her heart rate improved, converted back to sinus rhythm. Cardizem drip changed to metoprolol therapy. Continue heparin drip as per pharmacy protocol, change to eliquis tomorrow. Monitor electrolytes and replete as needed. 2D echo EF normal, grade 2 diastolic dysfunction Cardiology evaluation appreciated.  Hypokalemia Post replacement.  Will repeat labs tomorrow..  Elevated brain natriuretic peptide (BNP) level D-dimer elevated Chest x-ray showed  no evidence for CHF. No clinical signs of CHF or fluid overload Elevated BNP in the setting of A-fib.  Will follow cardiology recommendations  Severe Aortic stenosis: Check echocardiogram. Follow cardiology recommendations regarding TAVR procedure.  CKD stage 3 A- Kidney function stable. Monitor daily renal function. Avoid nephrotoxic drugs.  PT OT evaluation Out of bed to chair. Incentive spirometry. Nursing supportive care. Fall, aspiration precautions. DVT prophylaxis heparin drip   Code Status: Full Code  Subjective: Patient is seen and examined today morning after cardiac cath.  She is lying comfortably.  Denies any chest pain or palpitations.  Did not get out of bed.  Eating fair.  Physical Exam: Vitals:   04/11/23 1250 04/11/23 1251 04/11/23 1650 04/11/23 1652  BP:   (!) (P) 134/52   Pulse:   (P) 78 72  Resp: (!) 21 (!) 24 (!) (P) 23   Temp:   (P) 98.6 F (37 C)   TempSrc:   (P) Oral   SpO2:    95%  Weight:      Height:        General - Elderly Caucasian female, no apparent distress HEENT - PERRLA, EOMI, atraumatic head, non tender sinuses. Lung - Clear, no rales, rhonchi, wheezes. Heart - S1, S2 heard, no murmurs, rubs, trace pedal edema. Abdomen - Soft, non tender, non distended, bowel sounds good Neuro - Alert, awake and oriented x 3, non focal exam. Skin - Warm and dry.  Data Reviewed:      Latest Ref Rng & Units 04/11/2023    9:25 AM 04/11/2023    9:23 AM 04/11/2023    3:57 AM  CBC  WBC 4.0 - 10.5 K/uL   6.5   Hemoglobin 12.0 - 15.0 g/dL 81.1  91.4  78.2   Hematocrit 36.0 - 46.0 % 40.0  40.0  39.1   Platelets 150 - 400 K/uL   186       Latest Ref Rng & Units 04/11/2023    9:25 AM 04/11/2023    9:23 AM 04/11/2023    3:57 AM  BMP  Glucose 70 - 99 mg/dL   91   BUN 8 - 23 mg/dL   17   Creatinine 8.75 - 1.00 mg/dL   6.43   Sodium 329 - 518 mmol/L 140  140  138   Potassium 3.5 - 5.1 mmol/L 4.0  4.1  4.1   Chloride 98 - 111 mmol/L   106   CO2 22 -  32 mmol/L   26   Calcium 8.9 - 10.3 mg/dL   8.5    ECHOCARDIOGRAM COMPLETE  Result Date: 04/11/2023    ECHOCARDIOGRAM REPORT   Patient Name:   Hannah Hooper Date of Exam: 04/11/2023 Medical Rec #:  [id number]   Height:       60.0 in Accession #:    [id number]  Weight:       120.0 lb Date of Birth:  Jul 11, 1932   BSA:          1.502 m Patient Age:    89 years    BP:           143/51 mmHg Patient Gender: F           HR:           78 bpm. Exam Location:  [facility] Procedure: 2D Echo, Cardiac Doppler and Color Doppler Indications:     Atrial Fibrillation I48.91  History:         Patient has no prior history of Echocardiogram examinations.  Sonographer:     Cristela Blue Referring Phys:  [id number] KGURKYHC A ARIDA Diagnosing Phys: Lorine Bears MD IMPRESSIONS  1. Left ventricular ejection fraction, by estimation, is 55 to 60%. The left ventricle has normal function. The left ventricle has no regional wall motion abnormalities. There is moderate left ventricular hypertrophy. Left ventricular diastolic parameters are consistent with Grade II diastolic dysfunction (pseudonormalization).  2. Right ventricular systolic function is normal. The right ventricular size is normal.  3. Left atrial size was mildly dilated.  4. The mitral valve is normal in structure. Moderate mitral valve regurgitation. No evidence of mitral stenosis. Severe mitral annular calcification.  5. The aortic valve is calcified. Aortic valve regurgitation is not visualized. Severe aortic valve stenosis. Aortic valve mean gradient measures 31.0 mmHg. calculated area was 1.2. However, suspect that the recorded gradiet was not accurate and underestimate severity.  6. The inferior vena cava is normal in size with greater than 50% respiratory variability, suggesting right atrial pressure of 3 mmHg. FINDINGS  Left Ventricle: Left ventricular ejection fraction, by estimation, is 55 to 60%. The left ventricle has normal function. The left ventricle has no regional wall motion  abnormalities. The left ventricular internal cavity size was normal in size. There is  moderate left ventricular hypertrophy. Left ventricular diastolic parameters are consistent with Grade II diastolic dysfunction (pseudonormalization). Right Ventricle: The right ventricular size is normal. No increase in right ventricular wall thickness. Right ventricular systolic function is normal. Left Atrium: Left atrial size was mildly dilated. Right Atrium: Right atrial size was normal in size. Pericardium: There is no evidence of pericardial effusion. Mitral Valve: The mitral valve is normal in structure. There is moderate thickening of the mitral valve leaflet(s). There is moderate calcification of the mitral valve leaflet(s). Severe mitral annular calcification.  Moderate mitral valve regurgitation. No evidence of mitral valve stenosis. MV peak gradient, 7.0 mmHg. The mean mitral valve gradient is 4.0 mmHg. Tricuspid Valve: The tricuspid valve is normal in structure. Tricuspid valve regurgitation is mild . No evidence of tricuspid stenosis. Aortic Valve: The aortic valve is calcified. Aortic valve regurgitation is not visualized. Severe aortic stenosis is present. Aortic valve mean gradient measures 31.0 mmHg. Aortic valve peak gradient measures 43.3 mmHg. Aortic valve area, by VTI measures  1.50 cm. Pulmonic Valve: The pulmonic valve was normal in structure. Pulmonic valve regurgitation is not visualized. No evidence of pulmonic stenosis. Aorta: The aortic root is normal in size and structure. Venous: The inferior vena cava is normal in size with greater than 50% respiratory variability, suggesting right atrial pressure of 3 mmHg. IAS/Shunts: No atrial level shunt detected by color flow Doppler.  LEFT VENTRICLE PLAX 2D LVIDd:         3.70 cm   Diastology LVIDs:         2.70 cm   LV e' medial:    5.44 cm/s LV PW:         1.30 cm   LV E/e' medial:  25.6 LV IVS:        1.40 cm   LV e' lateral:   4.03 cm/s LVOT diam:      1.95 cm   LV E/e' lateral: 34.5 LV SV:         87 LV SV Index:   58 LVOT Area:     2.99 cm  RIGHT VENTRICLE RV Basal diam:  2.90 cm RV Mid diam:    1.50 cm RV S prime:     17.20 cm/s TAPSE (M-mode): 2.2 cm LEFT ATRIUM             Index        RIGHT ATRIUM          Index LA diam:        4.30 cm 2.86 cm/m   RA Area:     7.99 cm LA Vol (A2C):   47.3 ml 31.48 ml/m  RA Volume:   12.50 ml 8.32 ml/m LA Vol (A4C):   52.8 ml 35.15 ml/m LA Biplane Vol: 51.9 ml 34.55 ml/m  AORTIC VALVE AV Area (Vmax):    1.29 cm AV Area (Vmean):   1.24 cm AV Area (VTI):     1.50 cm AV Vmax:           329.00 cm/s AV Vmean:          226.000 cm/s AV VTI:            0.578 m AV Peak Grad:      43.3 mmHg AV Mean Grad:      31.0 mmHg LVOT Vmax:         142.00 cm/s LVOT Vmean:        94.100 cm/s LVOT VTI:          0.290 m LVOT/AV VTI ratio: 0.50  AORTA Ao Root diam: 2.65 cm MITRAL VALVE                  TRICUSPID VALVE MV Area (PHT): 2.63 cm       TR Peak grad:   29.8 mmHg MV Area VTI:   2.09 cm       TR Vmax:        273.00 cm/s MV Peak grad:  7.0 mmHg MV Mean grad:  4.0 mmHg  SHUNTS MV Vmax:       1.32 m/s       Systemic VTI:  0.29 m MV Vmean:      87.3 cm/s      Systemic Diam: 1.95 cm MV Decel Time: 288 msec MR Peak grad:    150.8 mmHg MR Mean grad:    69.0 mmHg MR Vmax:         614.00 cm/s MR Vmean:        362.0 cm/s MR PISA:         4.02 cm MR PISA Eff ROA: 20 mm MR PISA Radius:  0.80 cm MV E velocity: 139.00 cm/s MV A velocity: 123.00 cm/s MV E/A ratio:  1.13 Lorine Bears MD Electronically signed by Lorine Bears MD Signature Date/Time: 04/11/2023/4:26:30 PM    Final    CARDIAC CATHETERIZATION  Result Date: 04/11/2023   Prox RCA to Mid RCA lesion is 40% stenosed.   Ost RCA to Prox RCA lesion is 60% stenosed.   Ost LM lesion is 40% stenosed.   The left ventricular systolic function is normal.   LV end diastolic pressure is mildly elevated.   The left ventricular ejection fraction is 55-65% by visual estimate. 1.  Moderate  heavily calcified ostial RCA and left main coronary artery disease.  No evidence of obstructive lesions. 2.  Normal LV systolic function with mild apical hypokinesis. 3.  Severe aortic stenosis with mean gradient of 36 mmHg and calculated valve area of 0.6 cm. 4.  Right heart catheterization showed mildly elevated wedge pressure, mild pulmonary hypertension and low normal cardiac output. RA: 6 mmHg RV: 39/1 mmHg PW: 20 mmHg PA: 37/15 with a mean of 26 mmHg Cardiac output is 4 with an index of 2.66. Recommendations: Suspect that elevated troponin was due to supply demand ischemia in the setting of severe underlying aortic stenosis and atrial fibrillation with rapid ventricular response.  Ostial coronary artery disease does not require revascularization. Resume heparin 2 hours after removal of TR band.  Plan to transition to Eliquis tomorrow morning. Recommend outpatient evaluation for TAVR.     Family Communication: Family at bedside, updated regarding the care, the understand and agree.   Disposition: Status is: Inpatient Remains inpatient appropriate because: elevated troponin, heparin drip, rate control.  Planned Discharge Destination: Home with Home Health     Time spent: 40 minutes  Author: Marcelino Duster, MD 04/11/2023 5:08 PM Secure chat 7am to 7pm For on call review [email or web address].

## 2023-04-11 NOTE — Interval H&P Note (Signed)
History and Physical Interval Note:  04/11/2023 9:09 AM  Hannah Hooper  has presented today for surgery, with the diagnosis of nstemi.  The various methods of treatment have been discussed with the patient and family. After consideration of risks, benefits and other options for treatment, the patient has consented to  Procedure(s): LEFT HEART CATH AND CORONARY ANGIOGRAPHY (N/A) as a surgical intervention.  The patient's history has been reviewed, patient examined, no change in status, stable for surgery.  I have reviewed the patient's chart and labs.  Questions were answered to the patient's satisfaction.     Lorine Bears

## 2023-04-11 NOTE — Progress Notes (Signed)
Cardiology Progress Note  Patient ID: Hannah Hooper MRN: [id number] DOB: 12/11/1931 Date of Encounter: 04/11/2023  Primary Cardiologist: None  Subjective   Chief Complaint: None.   She underwent cardiac catheterization this morning via the right radial artery which showed moderate ostial RCA and left main coronary artery disease.  No obstructive disease requiring revascularization.  She was found to have severe aortic stenosis with valve area of 0.6 cm.  Inpatient Medications  Scheduled Meds:  amLODipine       amLODipine  5 mg Oral Daily   [MAR Hold] aspirin  81 mg Oral Daily   [MAR Hold] atorvastatin  80 mg Oral Daily   [MAR Hold] metoprolol tartrate  50 mg Oral BID   sodium chloride flush  3 mL Intravenous Q12H   Continuous Infusions:  sodium chloride     heparin Stopped (04/11/23 0831)   PRN Meds: sodium chloride, [MAR Hold] acetaminophen, amLODipine, [MAR Hold] magnesium hydroxide, [MAR Hold] ondansetron (ZOFRAN) IV, sodium chloride flush, [MAR Hold] traZODone   Vital Signs   Vitals:   04/11/23 1045 04/11/23 1100 04/11/23 1115 04/11/23 1130  BP: (!) 164/63 (!) 161/55 (!) 159/62 (!) 152/60  Pulse: 80 75 82 86  Resp: 18 18 18 16   Temp:      TempSrc:      SpO2: 96% 96% 94% 94%  Weight:      Height:        Intake/Output Summary (Last 24 hours) at 04/11/2023 1142 Last data filed at 04/11/2023 1030 Gross per 24 hour  Intake 680 ml  Output 800 ml  Net -120 ml      04/09/2023   12:01 AM 03/02/2018    9:17 AM 06/24/2016    9:22 AM  Last 3 Weights  Weight (lbs) 120 lb 115 lb 126 lb  Weight (kg) 54.432 kg 52.164 kg 57.153 kg      Telemetry  Overnight telemetry shows sinus bradycardia 50s, brief SVT, which I personally reviewed.   Physical Exam   Vitals:   04/11/23 1045 04/11/23 1100 04/11/23 1115 04/11/23 1130  BP: (!) 164/63 (!) 161/55 (!) 159/62 (!) 152/60  Pulse: 80 75 82 86  Resp: 18 18 18 16   Temp:      TempSrc:      SpO2: 96% 96% 94% 94%  Weight:       Height:        Intake/Output Summary (Last 24 hours) at 04/11/2023 1142 Last data filed at 04/11/2023 1030 Gross per 24 hour  Intake 680 ml  Output 800 ml  Net -120 ml       04/09/2023   12:01 AM 03/02/2018    9:17 AM 06/24/2016    9:22 AM  Last 3 Weights  Weight (lbs) 120 lb 115 lb 126 lb  Weight (kg) 54.432 kg 52.164 kg 57.153 kg    Body mass index is 23.44 kg/m.  General: Well nourished, well developed, in no acute distress Head: Atraumatic, normal size  Eyes: PEERLA, EOMI  Neck: Supple, no JVD Endocrine: No thryomegaly Cardiac: Normal S1, 3-6 systolic ejection murmur, late peaking, absent S2 Lungs: Clear to auscultation bilaterally, no wheezing, rhonchi or rales  Abd: Soft, nontender, no hepatomegaly  Ext: No edema, pulses 2+ Musculoskeletal: No deformities, BUE and BLE strength normal and equal Skin: Warm and dry, no rashes   Neuro: Alert and oriented to person, place, time, and situation, CNII-XII grossly intact, no focal deficits  Psych: Normal mood and affect   Labs  High  Sensitivity Troponin:   Recent Labs  Lab 04/09/23 0145 04/09/23 0625 04/09/23 0749 04/10/23 0801 04/10/23 0922  TROPONINIHS 116* 6,206* 10,271* 5,738* 4,929*     Cardiac EnzymesNo results for input(s): "TROPONINI" in the last 168 hours. No results for input(s): "TROPIPOC" in the last 168 hours.  Chemistry Recent Labs  Lab 04/09/23 0006 04/11/23 0357 04/11/23 0923 04/11/23 0925  NA 139 138 140 140  K 3.4* 4.1 4.1 4.0  CL 102 106  --   --   CO2 23 26  --   --   GLUCOSE 204* 91  --   --   BUN 21 17  --   --   CREATININE 0.95 0.99  --   --   CALCIUM 8.9 8.5*  --   --   PROT 6.6  --   --   --   ALBUMIN 3.9  --   --   --   AST 27  --   --   --   ALT 25  --   --   --   ALKPHOS 60  --   --   --   BILITOT 0.8  --   --   --   GFRNONAA 57* 55*  --   --   ANIONGAP 14 6  --   --     Hematology Recent Labs  Lab 04/09/23 0006 04/10/23 0254 04/11/23 0357 04/11/23 0923 04/11/23 0925   WBC 6.5 6.9 6.5  --   --   RBC 4.70 4.16 4.37  --   --   HGB 13.8 12.5 12.9 13.6 13.6  HCT 43.1 37.9 39.1 40.0 40.0  MCV 91.7 91.1 89.5  --   --   MCH 29.4 30.0 29.5  --   --   MCHC 32.0 33.0 33.0  --   --   RDW 13.2 13.5 13.4  --   --   PLT 200 177 186  --   --    BNP Recent Labs  Lab 04/09/23 0006  BNP 574.6*    DDimer  Recent Labs  Lab 04/09/23 0006  DDIMER 0.56*     Radiology  CARDIAC CATHETERIZATION  Result Date: 04/11/2023   Prox RCA to Mid RCA lesion is 40% stenosed.   Ost RCA to Prox RCA lesion is 60% stenosed.   Ost LM lesion is 40% stenosed.   The left ventricular systolic function is normal.   LV end diastolic pressure is mildly elevated.   The left ventricular ejection fraction is 55-65% by visual estimate. 1.  Moderate heavily calcified ostial RCA and left main coronary artery disease.  No evidence of obstructive lesions. 2.  Normal LV systolic function with mild apical hypokinesis. 3.  Severe aortic stenosis with mean gradient of 36 mmHg and calculated valve area of 0.6 cm. 4.  Right heart catheterization showed mildly elevated wedge pressure, mild pulmonary hypertension and low normal cardiac output. RA: 6 mmHg RV: 39/1 mmHg PW: 20 mmHg PA: 37/15 with a mean of 26 mmHg Cardiac output is 4 with an index of 2.66. Recommendations: Suspect that elevated troponin was due to supply demand ischemia in the setting of severe underlying aortic stenosis and atrial fibrillation with rapid ventricular response.  Ostial coronary artery disease does not require revascularization. Resume heparin 2 hours after removal of TR band.  Plan to transition to Eliquis tomorrow morning. Recommend outpatient evaluation for TAVR.   US Carotid Bilateral  Result Date: 04/09/2023 CLINICAL DATA:  Bilateral carotid bruits, hypertension  EXAM: BILATERAL CAROTID DUPLEX ULTRASOUND TECHNIQUE: Wallace Cullens scale imaging, color Doppler and duplex ultrasound were performed of bilateral carotid and vertebral arteries  in the neck. COMPARISON:  CTA 05/02/2007 FINDINGS: Criteria: Quantification of carotid stenosis is based on velocity parameters that correlate the residual internal carotid diameter with NASCET-based stenosis levels, using the diameter of the distal internal carotid lumen as the denominator for stenosis measurement. The following velocity measurements were obtained: RIGHT ICA: 299/51 cm/sec CCA: 72/10 cm/sec SYSTOLIC ICA/CCA RATIO:  4.2 ECA: 117 cm/sec LEFT ICA: 159/35 cm/sec CCA: 37/9 cm/sec SYSTOLIC ICA/CCA RATIO:  4.3 ECA: 65 cm/sec RIGHT CAROTID ARTERY: Intimal thickening in the common carotid. Calcified plaque in the bulb, proximal ECA, and ICA origin with markedly elevated peak systolic velocities in the proximal ICA with spectral broadening, and decreased systolic upstroke distally. RIGHT VERTEBRAL ARTERY: Patent with antegrade flow, delayed systolic upstroke. LEFT CAROTID ARTERY: Mild tortuosity. Calcified plaque in the bulb, proximal ECA, and ICA origin resulting in at least mild stenosis. No focal aliasing. There is a delayed systolic upstroke in the mid and distal ICA. LEFT VERTEBRAL ARTERY:  Normal flow direction and waveform. IMPRESSION: 1. 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 2. Left carotid bifurcation plaque resulting in 50-69% ICA stenosis. 3. Patent vertebral arteries with antegrade flow. Electronically Signed   By: Corlis Leak M.D.   On: 04/09/2023 20:10    Cardiac Studies  Carotid ultrasound 04/09/2023 IMPRESSION: 1. 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 2. Left carotid bifurcation plaque resulting in 50-69% ICA stenosis. 3. Patent vertebral arteries with antegrade flow.  Patient Profile  89 year old female with history of dementia, aortic stenosis, hypertension, hyperlipidemia admitted 04/08/2023 for A-fib with RVR and non-STEMI.   Assessment & Plan   1.-non-STEMI in the setting of rapid A-fib.  EKG  changes were dynamic and profound. -Has converted back to sinus rhythm.  EKG changes have resolved. -Suspect supply demand ischemia in the setting of underlying severe aortic stenosis and moderate coronary artery disease. Cardiac catheterization showed moderate ostial RCA and left main coronary artery disease with no obstructive lesions.  Recommend medical therapy.  2.  Severe aortic stenosis: This was confirmed by cardiac catheterization today with mean gradient of 36 mmHg and valve area of 0.6 cm.  Recommend evaluation for TAVR.  We scheduled the patient with Dr. Excell Seltzer as an outpatient on Monday.  3.  Paroxysmal atrial fibrillation: She converted to sinus rhythm.  Heparin drip to be resumed 2 hours after removal of TR band with plans to transition to Eliquis 5 mg twice daily starting tomorrow morning.  4.  Dementia: This seems to be mild overall and the patient continues to be functional and very active.  She seems to be independent.  5.  Essential hypertension: Her blood pressure is elevated.  I resumed amlodipine 5 mg once daily.  I discussed the Findings and the plan with the patient's son who was at the bedside.      For questions or updates, please contact [facility] Please consult [email or web address] for contact info under        Signed, Lorine Bears, MD , [facility] [place]  [facility]  04/11/2023 11:42 AM

## 2023-04-11 NOTE — TOC Benefit Eligibility Note (Signed)
Patient Product/process development scientist completed.    The patient is insured through [employer]. Patient has Medicare and is not eligible for a copay card, but may be able to apply for patient assistance, if available.    Ran test claim for Eliquis 5 mg and the current 30 day co-pay is $297.00 due to a $250.00 deductible.  Will be $47.00 once deductible is met.   This test claim was processed through [facility]- copay amounts may vary at other pharmacies due to pharmacy/plan contracts, or as the patient moves through the different stages of their insurance plan.     Roland Earl, CPHT Pharmacy Technician III Certified Patient Advocate [facility] Patient Advocate Team Direct Number: [phone]  Fax: [phone]

## 2023-04-11 NOTE — Consult Note (Signed)
ANTICOAGULATION CONSULT NOTE  Pharmacy Consult for Heparin Infusion Indication: chest pain/ACS  No Known Allergies  Patient Measurements: Height: 5' (152.4 cm) Weight: 54.4 kg (120 lb) IBW/kg (Calculated) : 45.5 Heparin Dosing Weight: 54.4 kg  Vital Signs: Temp: 98.5 F (36.9 C) (09/23 0000) Temp Source: Oral (09/23 0000) BP: 141/58 (09/23 0000) Pulse Rate: 66 (09/23 0000)  Labs: Recent Labs    04/09/23 0006 04/09/23 0145 04/09/23 0749 04/09/23 0826 04/09/23 1803 04/10/23 0254 04/10/23 0801 04/10/23 0922 04/10/23 1122 04/11/23 0357  HGB 13.8  --   --   --   --  12.5  --   --   --  12.9  HCT 43.1  --   --   --   --  37.9  --   --   --  39.1  PLT 200  --   --   --   --  177  --   --   --  186  APTT  --   --   --  31   < > 97*  --   --  90* 87*  LABPROT 12.6  --   --   --   --   --   --   --   --   --   INR 0.9  --   --   --   --   --   --   --   --   --   HEPARINUNFRC  --   --   --  0.79*  --  0.77*  --   --   --  0.47  CREATININE 0.95  --   --   --   --   --   --   --   --  0.99  TROPONINIHS 27*   < > 10,271*  --   --   --  5,738* 4,929*  --   --    < > = values in this interval not displayed.    Estimated Creatinine Clearance: 27.7 mL/min (by C-G formula based on SCr of 0.99 mg/dL).   Medical History: Past Medical History:  Diagnosis Date   Benign neoplasm of colon, unspecified    Carotid artery disease (HCC)    Diverticulosis    Hyperlipemia    Hypertension    Hypertensive kidney disease with CKD stage III (HCC)    Lesion of ulnar nerve    Osteoarthritis    Valvular heart disease    a. 02/2020 Echo: EF 50%, triv AI, mild AS/MR/MS/TR.   Assessment: Hannah Hooper is a 89 y.o. female presenting with palpitations and chest tightness. PMH significant for AF, HTN, HLD, CAD, CKD3, diverticulosis. Patient was on [facility] PTA per chart review. Last dose of apixaban 2.5 was 9/21 at 0253. Pharmacy has been consulted to initiate and manage heparin infusion.   Baseline  Labs: aPTT 31, HL 0.79, PT 12.6, INR 0.9, Hgb 13.8, Hct 43.1, Plt 200   Goal of Therapy:  Heparin level 0.3-0.7 units/ml aPTT 66-102 seconds Monitor platelets by anticoagulation protocol: Yes   Date Time aPTT/HL Rate/Comment  9/21 1803 63/---  650/subtherapeutic 9/22 0254 97 / 0.77 750/therapeutic x 1 9/22 1122 90  750/therapeutic 9/23 0357 87 / 0.47 Therapeutic x 3 / HL correlating x 1  Plan:  Heparin remains therapeutic Continue heparin infusion at 750 units/hr Recheck aPTT/HL with AM labs  Continue to monitor aPTT until HL and aPTT correlate.  Continue to monitor H&H and platelets daily while on  heparin infusion    Otelia Sergeant, PharmD, [facility] 04/11/2023 4:47 AM

## 2023-04-12 ENCOUNTER — Encounter: Payer: Self-pay | Admitting: Cardiovascular Disease

## 2023-04-12 ENCOUNTER — Telehealth: Payer: Self-pay | Admitting: *Deleted

## 2023-04-12 DIAGNOSIS — E876 Hypokalemia: Secondary | ICD-10-CM | POA: Diagnosis not present

## 2023-04-12 DIAGNOSIS — I350 Nonrheumatic aortic (valve) stenosis: Secondary | ICD-10-CM | POA: Diagnosis not present

## 2023-04-12 DIAGNOSIS — R7989 Other specified abnormal findings of blood chemistry: Secondary | ICD-10-CM | POA: Diagnosis not present

## 2023-04-12 DIAGNOSIS — I4891 Unspecified atrial fibrillation: Secondary | ICD-10-CM | POA: Diagnosis not present

## 2023-04-12 DIAGNOSIS — N1831 Chronic kidney disease, stage 3a: Secondary | ICD-10-CM | POA: Insufficient documentation

## 2023-04-12 LAB — CBC
HCT: 38.4 % (ref 36.0–46.0)
Hemoglobin: 12.9 g/dL (ref 12.0–15.0)
MCH: 30 pg (ref 26.0–34.0)
MCHC: 33.6 g/dL (ref 30.0–36.0)
MCV: 89.3 fL (ref 80.0–100.0)
Platelets: 189 10*3/uL (ref 150–400)
RBC: 4.3 MIL/uL (ref 3.87–5.11)
RDW: 13.5 % (ref 11.5–15.5)
WBC: 6.5 10*3/uL (ref 4.0–10.5)
nRBC: 0 % (ref 0.0–0.2)

## 2023-04-12 LAB — APTT: aPTT: 80 seconds — ABNORMAL HIGH (ref 24–36)

## 2023-04-12 LAB — HEPARIN LEVEL (UNFRACTIONATED): Heparin Unfractionated: 0.37 IU/mL (ref 0.30–0.70)

## 2023-04-12 MED ORDER — ASPIRIN 81 MG PO CHEW: 81.0000 mg | CHEWABLE_TABLET | Freq: Every day | ORAL | 3 refills | Status: DC

## 2023-04-12 MED ORDER — VITAMIN B-12 1000 MCG PO TABS: 1000.0000 ug | ORAL_TABLET | Freq: Every day | ORAL | 3 refills | Status: DC

## 2023-04-12 MED ORDER — ATORVASTATIN CALCIUM 40 MG PO TABS: 40.0000 mg | ORAL_TABLET | Freq: Every day | ORAL | 3 refills | Status: DC

## 2023-04-12 MED ORDER — APIXABAN 2.5 MG PO TABS: 2.5000 mg | ORAL_TABLET | Freq: Two times a day (BID) | ORAL | 3 refills | Status: DC

## 2023-04-12 MED ORDER — AMLODIPINE BESYLATE 5 MG PO TABS: 5.0000 mg | ORAL_TABLET | Freq: Every day | ORAL | 3 refills | Status: DC

## 2023-04-12 MED ORDER — APIXABAN 2.5 MG PO TABS
2.5000 mg | ORAL_TABLET | Freq: Two times a day (BID) | ORAL | Status: DC
  Administered 2023-04-12: 2.5 mg via ORAL
  Filled 2023-04-12: qty 1

## 2023-04-12 MED ORDER — SULFASALAZINE 500 MG PO TABS: 1000.0000 mg | ORAL_TABLET | Freq: Every day | ORAL | 0 refills | Status: DC

## 2023-04-12 MED ORDER — METOPROLOL TARTRATE 50 MG PO TABS: 50.0000 mg | ORAL_TABLET | Freq: Two times a day (BID) | ORAL | 2 refills | Status: DC

## 2023-04-12 NOTE — Consult Note (Signed)
ANTICOAGULATION CONSULT NOTE  Pharmacy Consult for Heparin Infusion Indication: chest pain/ACS  No Known Allergies  Patient Measurements: Height: 5' (152.4 cm) Weight: 54.4 kg (120 lb) IBW/kg (Calculated) : 45.5 Heparin Dosing Weight: 54.4 kg  Vital Signs: Temp: 99.1 F (37.3 C) (09/24 0031)  Labs: Recent Labs    04/09/23 0749 04/09/23 0826 04/10/23 0254 04/10/23 0801 04/10/23 0922 04/10/23 1122 04/11/23 0357 04/11/23 0923 04/11/23 0925 04/12/23 0537  HGB  --    < > 12.5  --   --   --  12.9 13.6 13.6 12.9  HCT  --    < > 37.9  --   --   --  39.1 40.0 40.0 38.4  PLT  --   --  177  --   --   --  186  --   --  189  APTT  --    < > 97*  --   --  90* 87*  --   --  80*  HEPARINUNFRC  --    < > 0.77*  --   --   --  0.47  --   --  0.37  CREATININE  --   --   --   --   --   --  0.99  --   --   --   TROPONINIHS 10,271*  --   --  5,738* 4,929*  --   --   --   --   --    < > = values in this interval not displayed.    Estimated Creatinine Clearance: 27.7 mL/min (by C-G formula based on SCr of 0.99 mg/dL).   Medical History: Past Medical History:  Diagnosis Date   Benign neoplasm of colon, unspecified    Carotid artery disease (HCC)    Diverticulosis    Hyperlipemia    Hypertension    Hypertensive kidney disease with CKD stage III (HCC)    Lesion of ulnar nerve    Osteoarthritis    Valvular heart disease    a. 02/2020 Echo: EF 50%, triv AI, mild AS/MR/MS/TR.   Assessment: Hannah Hooper is a 89 y.o. female presenting with palpitations and chest tightness. PMH significant for AF, HTN, HLD, CAD, CKD3, diverticulosis. Patient was on [facility] PTA per chart review. Last dose of apixaban 2.5 was 9/21 at 0253. Pharmacy has been consulted to initiate and manage heparin infusion.   Baseline Labs: aPTT 31, HL 0.79, PT 12.6, INR 0.9, Hgb 13.8, Hct 43.1, Plt 200   Goal of Therapy:  Heparin level 0.3-0.7 units/ml aPTT 66-102 seconds Monitor platelets by anticoagulation protocol: Yes    Date Time aPTT/HL Rate/Comment  9/21 1803 63/---  650/subtherapeutic 9/22 0254 97 / 0.77 750/therapeutic x 1 9/22 1122 90  750/therapeutic 9/23 0357 87 / 0.47 Therapeutic x 3 / HL correlating x 1 9/23 Resumed today 2hrs after bad removed 1420*  9/24    0537    80 / 0.37         Therapeutic X 4   Plan:  9/24 @ 0537:  aPTT = 80,   HL = 0.37 - aPTT therapeutic X 4, now correlating with HL  - Will use HL to guide dosing from here on - Will recheck HL on 9/25 with AM labs - Continue to monitor H&H and platelets daily while on heparin infusion    Latravion Graves D 04/12/2023 6:49 AM

## 2023-04-12 NOTE — Plan of Care (Signed)

## 2023-04-12 NOTE — Consult Note (Signed)
ANTICOAGULATION CONSULT NOTE  Pharmacy Consult for Eliquis Indication: AFib  No Known Allergies  Patient Measurements: Height: 5' (152.4 cm) Weight: 54.4 kg (120 lb) IBW/kg (Calculated) : 45.5 Heparin Dosing Weight: 54.4 kg  Vital Signs: Temp: 98.1 F (36.7 C) (09/24 0742) Temp Source: Oral (09/24 0742) BP: 137/50 (09/24 0900) Pulse Rate: 77 (09/24 0400)  Labs: Recent Labs    04/10/23 0254 04/10/23 0801 04/10/23 0922 04/10/23 1122 04/11/23 0357 04/11/23 0923 04/11/23 0925 04/12/23 0537  HGB 12.5  --   --   --  12.9 13.6 13.6 12.9  HCT 37.9  --   --   --  39.1 40.0 40.0 38.4  PLT 177  --   --   --  186  --   --  189  APTT 97*  --   --  90* 87*  --   --  80*  HEPARINUNFRC 0.77*  --   --   --  0.47  --   --  0.37  CREATININE  --   --   --   --  0.99  --   --   --   TROPONINIHS  --  5,738* 4,929*  --   --   --   --   --     Estimated Creatinine Clearance: 27.7 mL/min (by C-G formula based on SCr of 0.99 mg/dL).   Medical History: Past Medical History:  Diagnosis Date   Benign neoplasm of colon, unspecified    Carotid artery disease (HCC)    Diverticulosis    Hyperlipemia    Hypertension    Hypertensive kidney disease with CKD stage III (HCC)    Lesion of ulnar nerve    Osteoarthritis    Valvular heart disease    a. 02/2020 Echo: EF 50%, triv AI, mild AS/MR/MS/TR.   Assessment: Hannah Hooper is a 89 y.o. female presenting with palpitations and chest tightness. PMH significant for AF, HTN, HLD, CAD, CKD3, diverticulosis. Patient was on [facility] PTA per chart review. Last dose of apixaban 2.5 was 9/21 at 0253. Pharmacy has been consulted to initiate and manage heparin infusion.   Baseline Labs: aPTT 31, HL 0.79, PT 12.6, INR 0.9, Hgb 13.8, Hct 43.1, Plt 200    Plan:  DC heparin infusion Start Apixaban 2.5mg  BID (Age 41, Scr 0.99, wt 54.4kg)  Tarl Cephas Rodriguez-Guzman PharmD, BCPS 04/12/2023 9:45 AM

## 2023-04-12 NOTE — Telephone Encounter (Signed)
End, Cristal Deer, MD  P Cv Div Burl Scheduling; P Cv Div Burl Triage Cc: Charlsie Quest, NP Good morning,  Could you help arrange for hospital f/u for the following patient:  Hannah Hooper (MRN: [id number]) - see Dr. Kirke Corin or APP in 1 month (already has visit with Dr. Excell Seltzer on 9/30, which she should keep)  Thanks.  Thayer Ohm

## 2023-04-12 NOTE — Progress Notes (Signed)
   Patient Name: Hannah Hooper Date of Encounter: 04/12/2023 [facility] Cardiologist: None   Interval Summary  .    Patient seen on a.m. rounds.  Denies any chest pain or shortness of breath.  She underwent right and left heart catheterization on 04/11/2023.  Right radial artery without bruising, bleeding, or hematoma.  Vital Signs .    Vitals:   04/12/23 0746 04/12/23 0800 04/12/23 0900 04/12/23 1100  BP:  (!) 134/57 (!) 137/50   Pulse:      Resp: (!) 24 17 20    Temp:    98.1 F (36.7 C)  TempSrc:    Oral  SpO2:      Weight:      Height:        Intake/Output Summary (Last 24 hours) at 04/12/2023 1303 Last data filed at 04/11/2023 1539 Gross per 24 hour  Intake 207.35 ml  Output --  Net 207.35 ml      04/09/2023   12:01 AM 03/02/2018    9:17 AM 06/24/2016    9:22 AM  Last 3 Weights  Weight (lbs) 120 lb 115 lb 126 lb  Weight (kg) 54.432 kg 52.164 kg 57.153 kg      Telemetry/ECG    Sinus rhythm rates in the 60s- Personally Reviewed  Physical Exam .   GEN: No acute distress.   Neck: No JVD, heart murmur radiates into the bilateral carotids Cardiac: RRR,III/VI systolic murmur without rubs or gallops.  Respiratory: Clear to auscultation bilaterally.  Respirations are unlabored at rest on room air GI: Soft, nontender, non-distended  MS: No edema  Assessment & Plan .     NSTEMI in the setting of rapid atrial fibrillation -Suspect supply/demand ischemia in the setting of underlying severe aortic stenosis with moderate coronary artery disease -Cardiac catheterization showed moderate ostial RCA left main coronary artery disease with no obstructive lesions she was recommended for medical therapy -She continues to remain chest pain-free -Continued on aspirin, atorvastatin, metoprolol  Severe aortic stenosis -Heart murmur on exam -Confirmed by cardiac catheterization with mean gradient 36 mmHg and a valve area of 0.6 cm -She has outpatient appointment with Dr.  Excell Seltzer in [place] on Monday for evaluation for TAVR procedure  Paroxysmal atrial fibrillation -Presented with A-fib RVR -Currently maintaining sinus rhythm -Continue on apixaban 2.5 mg twice daily for CHA2DS2-VASc score of at least 5 for stroke prophylaxis -Continue with telemetry monitoring  Essential hypertension -Blood pressure 137/50 -Continued on amlodipine 5 mg daily and metoprolol 50 mg twice daily -Vital signs per unit protocol  Dementia -Baseline pleasant dementia -She continues to remain functional and active -Her sons assist with medications    CHA2DS2-VASc Score = 5   This indicates a 7.2% annual risk of stroke. The patient's score is based upon: CHF History: 0 HTN History: 1 Diabetes History: 0 Stroke History: 0 Vascular Disease History: 1 Age Score: 2 Gender Score: 1      For questions or updates, please contact [facility] Please consult [email or web address] for contact info under        Signed, Sephora Boyar, NP

## 2023-04-12 NOTE — Discharge Summary (Signed)
Physician Discharge Summary   Patient: Hannah Hooper MRN: [id number] DOB: 05-01-1932  Admit date:     04/08/2023  Discharge date: 04/12/23  Discharge Physician: Hannah Hooper   PCP: Hannah Regulus, MD   Recommendations at discharge:   Primary care physician follow-up in 1 week Cardiology follow-up as scheduled.   Discharge Diagnoses: Principal Problem:   Atrial fibrillation with rapid ventricular response (HCC) Active Problems:   Hypokalemia   Elevated troponin   Elevated brain natriuretic peptide (BNP) level   Chest pain   Nonrheumatic aortic valve stenosis   Chronic kidney disease, stage 3a (HCC)  Resolved Problems:   * No resolved hospital problems. *  Hospital Course: Hannah Hooper is a 89 y.o. Caucasian female with medical history significant for paroxysmal atrial fibrillation, hypertension, dyslipidemia, diverticulosis, coronary artery disease and stage III chronic kidney disease, who presented to the emergency room with acute onset of palpitations with associated chest pain felt this tightness that started at 10 PM tonight.  Patient's troponin bumped to 10,000, started on heparin drip, seen by cardiologist who recommended to continue rate control medication, beta-blocker therapy.  Patient went for heart cath procedure which showed moderate ostial RCA and left main coronary artery disease, no obstructive disease requiring vascularization.  Cardiologist recommended medical management, aspirin, statin, metoprolol, Eliquis therapy.  She has aortic stenosis which will need TAVR procedure evaluation as outpatient.  She has no chest pain, palpitations or shortness of breath while inpatient.  Patient is hemodynamically stable to be discharged home.  New prescription sent to pharmacy.  Patient and his son understand agree with the discharge plan   Assessment and Plan: *Type 2 MI- Demand ischemia in the setting of rapid Afib. Patient's troponin bumped to 10,000, down  trended. S/p cardiac catheterization which showed moderate ostial RCA and left main coronary artery disease. No obstructive disease requiring revascularization. 2D echo EF normal, grade 2 diastolic dysfunction. EKG showed rapid A-fib, no acute ST-T wave changes. Heparin drip transitioned to Eliquis 2.5 mb twice daily (due to age, GFR) Continue aspirin, statin, beta-blocker therapy.   Atrial fibrillation with rapid ventricular response (HCC) Her heart rate improved, converted back to sinus rhythm. Cardizem drip changed to metoprolol therapy. Continue Eliquis 2.5mg  bid. Risks of anticoagulation explained. Cardiology follow-up as scheduled.   Hypokalemia Improved with replacement.   Elevated brain natriuretic peptide (BNP) level Chest x-ray showed no evidence for CHF. No clinical signs of CHF or fluid overload Outpatient cardiology follow-up.   Severe Aortic stenosis: Cardiology follow-up scheduled as outpatient for TAVR evaluation.   CKD stage 3 A- Kidney function stable. Monitor daily renal function. Avoid nephrotoxic drugs.        Consultants: Cardiology Procedures performed: Cardiac catheterization Disposition: Home Diet recommendation:  Discharge Diet Orders (From admission, onward)     Start     Ordered   04/12/23 0000  Diet - low sodium heart healthy        04/12/23 1238           Carb modified diet DISCHARGE MEDICATION: Allergies as of 04/12/2023   No Known Allergies      Medication List     STOP taking these medications    HYDROcodone-acetaminophen 5-325 MG tablet Commonly known as: Norco   lisinopril-hydrochlorothiazide 20-12.5 MG tablet Commonly known as: ZESTORETIC   pravastatin 80 MG tablet Commonly known as: PRAVACHOL   prednisoLONE acetate 1 % ophthalmic suspension Commonly known as: PRED FORTE   VISINE OP  TAKE these medications    amLODipine 5 MG tablet Commonly known as: NORVASC Take 1 tablet (5 mg total) by mouth  daily.   apixaban 2.5 MG Tabs tablet Commonly known as: ELIQUIS Take 1 tablet (2.5 mg total) by mouth 2 (two) times daily.   aspirin 81 MG chewable tablet Chew 1 tablet (81 mg total) by mouth daily. Start taking on: April 13, 2023   atorvastatin 40 MG tablet Commonly known as: LIPITOR Take 1 tablet (40 mg total) by mouth daily. Start taking on: April 13, 2023   cyanocobalamin 1000 MCG tablet Commonly known as: VITAMIN B12 Take 1 tablet (1,000 mcg total) by mouth daily.   metoprolol tartrate 50 MG tablet Commonly known as: LOPRESSOR Take 1 tablet (50 mg total) by mouth 2 (two) times daily.   sulfaSALAzine 500 MG tablet Commonly known as: AZULFIDINE Take 2 tablets (1,000 mg total) by mouth daily.        Discharge Exam: Filed Weights   04/09/23 0001  Weight: 54.4 kg   General - Elderly Caucasian female, no apparent distress HEENT - PERRLA, EOMI, atraumatic head, non tender sinuses. Lung - Clear, no rales, rhonchi, wheezes. Heart - S1, S2 heard, no murmurs, rubs, trace pedal edema. Abdomen - Soft, non tender, non distended, bowel sounds good Neuro - Alert, awake and oriented x 3, non focal exam. Skin - Warm and dry.  Condition at discharge: stable  The results of significant diagnostics from this hospitalization (including imaging, microbiology, ancillary and laboratory) are listed below for reference.   Imaging Studies: ECHOCARDIOGRAM COMPLETE  Result Date: 04/11/2023    ECHOCARDIOGRAM REPORT   Patient Name:   Hannah Hooper Date of Exam: 04/11/2023 Medical Rec #:  [id number]   Height:       60.0 in Accession #:    [id number]  Weight:       120.0 lb Date of Birth:  1931/10/12   BSA:          1.502 m Patient Age:    89 years    BP:           143/51 mmHg Patient Gender: F           HR:           78 bpm. Exam Location:  [facility] Procedure: 2D Echo, Cardiac Doppler and Color Doppler Indications:     Atrial Fibrillation I48.91  History:         Patient has no prior history  of Echocardiogram examinations.  Sonographer:     Cristela Blue Referring Phys:  [id number] QIONGEXB A ARIDA Diagnosing Phys: Lorine Bears MD IMPRESSIONS  1. Left ventricular ejection fraction, by estimation, is 55 to 60%. The left ventricle has normal function. The left ventricle has no regional wall motion abnormalities. There is moderate left ventricular hypertrophy. Left ventricular diastolic parameters are consistent with Grade II diastolic dysfunction (pseudonormalization).  2. Right ventricular systolic function is normal. The right ventricular size is normal.  3. Left atrial size was mildly dilated.  4. The mitral valve is normal in structure. Moderate mitral valve regurgitation. No evidence of mitral stenosis. Severe mitral annular calcification.  5. The aortic valve is calcified. Aortic valve regurgitation is not visualized. Severe aortic valve stenosis. Aortic valve mean gradient measures 31.0 mmHg. calculated area was 1.2. However, suspect that the recorded gradiet was not accurate and underestimate severity.  6. The inferior vena cava is normal in size with greater than 50% respiratory variability, suggesting right atrial pressure  of 3 mmHg. FINDINGS  Left Ventricle: Left ventricular ejection fraction, by estimation, is 55 to 60%. The left ventricle has normal function. The left ventricle has no regional wall motion abnormalities. The left ventricular internal cavity size was normal in size. There is  moderate left ventricular hypertrophy. Left ventricular diastolic parameters are consistent with Grade II diastolic dysfunction (pseudonormalization). Right Ventricle: The right ventricular size is normal. No increase in right ventricular wall thickness. Right ventricular systolic function is normal. Left Atrium: Left atrial size was mildly dilated. Right Atrium: Right atrial size was normal in size. Pericardium: There is no evidence of pericardial effusion. Mitral Valve: The mitral valve is normal in structure.  There is moderate thickening of the mitral valve leaflet(s). There is moderate calcification of the mitral valve leaflet(s). Severe mitral annular calcification. Moderate mitral valve regurgitation. No evidence of mitral valve stenosis. MV peak gradient, 7.0 mmHg. The mean mitral valve gradient is 4.0 mmHg. Tricuspid Valve: The tricuspid valve is normal in structure. Tricuspid valve regurgitation is mild . No evidence of tricuspid stenosis. Aortic Valve: The aortic valve is calcified. Aortic valve regurgitation is not visualized. Severe aortic stenosis is present. Aortic valve mean gradient measures 31.0 mmHg. Aortic valve peak gradient measures 43.3 mmHg. Aortic valve area, by VTI measures  1.50 cm. Pulmonic Valve: The pulmonic valve was normal in structure. Pulmonic valve regurgitation is not visualized. No evidence of pulmonic stenosis. Aorta: The aortic root is normal in size and structure. Venous: The inferior vena cava is normal in size with greater than 50% respiratory variability, suggesting right atrial pressure of 3 mmHg. IAS/Shunts: No atrial level shunt detected by color flow Doppler.  LEFT VENTRICLE PLAX 2D LVIDd:         3.70 cm   Diastology LVIDs:         2.70 cm   LV e' medial:    5.44 cm/s LV PW:         1.30 cm   LV E/e' medial:  25.6 LV IVS:        1.40 cm   LV e' lateral:   4.03 cm/s LVOT diam:     1.95 cm   LV E/e' lateral: 34.5 LV SV:         87 LV SV Index:   58 LVOT Area:     2.99 cm  RIGHT VENTRICLE RV Basal diam:  2.90 cm RV Mid diam:    1.50 cm RV S prime:     17.20 cm/s TAPSE (M-mode): 2.2 cm LEFT ATRIUM             Index        RIGHT ATRIUM          Index LA diam:        4.30 cm 2.86 cm/m   RA Area:     7.99 cm LA Vol (A2C):   47.3 ml 31.48 ml/m  RA Volume:   12.50 ml 8.32 ml/m LA Vol (A4C):   52.8 ml 35.15 ml/m LA Biplane Vol: 51.9 ml 34.55 ml/m  AORTIC VALVE AV Area (Vmax):    1.29 cm AV Area (Vmean):   1.24 cm AV Area (VTI):     1.50 cm AV Vmax:           329.00 cm/s AV  Vmean:          226.000 cm/s AV VTI:            0.578 m AV Peak Grad:  43.3 mmHg AV Mean Grad:      31.0 mmHg LVOT Vmax:         142.00 cm/s LVOT Vmean:        94.100 cm/s LVOT VTI:          0.290 m LVOT/AV VTI ratio: 0.50  AORTA Ao Root diam: 2.65 cm MITRAL VALVE                  TRICUSPID VALVE MV Area (PHT): 2.63 cm       TR Peak grad:   29.8 mmHg MV Area VTI:   2.09 cm       TR Vmax:        273.00 cm/s MV Peak grad:  7.0 mmHg MV Mean grad:  4.0 mmHg       SHUNTS MV Vmax:       1.32 m/s       Systemic VTI:  0.29 m MV Vmean:      87.3 cm/s      Systemic Diam: 1.95 cm MV Decel Time: 288 msec MR Peak grad:    150.8 mmHg MR Mean grad:    69.0 mmHg MR Vmax:         614.00 cm/s MR Vmean:        362.0 cm/s MR PISA:         4.02 cm MR PISA Eff ROA: 20 mm MR PISA Radius:  0.80 cm MV E velocity: 139.00 cm/s MV A velocity: 123.00 cm/s MV E/A ratio:  1.13 Lorine Bears MD Electronically signed by Lorine Bears MD Signature Date/Time: 04/11/2023/4:26:30 PM    Final    CARDIAC CATHETERIZATION  Result Date: 04/11/2023   Prox RCA to Mid RCA lesion is 40% stenosed.   Ost RCA to Prox RCA lesion is 60% stenosed.   Ost LM lesion is 40% stenosed.   The left ventricular systolic function is normal.   LV end diastolic pressure is mildly elevated.   The left ventricular ejection fraction is 55-65% by visual estimate. 1.  Moderate heavily calcified ostial RCA and left main coronary artery disease.  No evidence of obstructive lesions. 2.  Normal LV systolic function with mild apical hypokinesis. 3.  Severe aortic stenosis with mean gradient of 36 mmHg and calculated valve area of 0.6 cm. 4.  Right heart catheterization showed mildly elevated wedge pressure, mild pulmonary hypertension and low normal cardiac output. RA: 6 mmHg RV: 39/1 mmHg PW: 20 mmHg PA: 37/15 with a mean of 26 mmHg Cardiac output is 4 with an index of 2.66. Recommendations: Suspect that elevated troponin was due to supply demand ischemia in the setting of  severe underlying aortic stenosis and atrial fibrillation with rapid ventricular response.  Ostial coronary artery disease does not require revascularization. Resume heparin 2 hours after removal of TR band.  Plan to transition to Eliquis tomorrow morning. Recommend outpatient evaluation for TAVR.   US Carotid Bilateral  Result Date: 04/09/2023 CLINICAL DATA:  Bilateral carotid bruits, hypertension EXAM: BILATERAL CAROTID DUPLEX ULTRASOUND TECHNIQUE: Wallace Cullens scale imaging, color Doppler and duplex ultrasound were performed of bilateral carotid and vertebral arteries in the neck. COMPARISON:  [facility]A 05/02/2007 FINDINGS: Criteria: Quantification of carotid stenosis is based on velocity parameters that correlate the residual internal carotid diameter with NASCET-based stenosis levels, using the diameter of the distal internal carotid lumen as the denominator for stenosis measurement. The following velocity measurements were obtained: RIGHT ICA: 299/51 cm/sec CCA: 72/10 cm/sec SYSTOLIC ICA/CCA RATIO:  4.2 ECA: 117  cm/sec LEFT ICA: 159/35 cm/sec CCA: 37/9 cm/sec SYSTOLIC ICA/CCA RATIO:  4.3 ECA: 65 cm/sec RIGHT CAROTID ARTERY: Intimal thickening in the common carotid. Calcified plaque in the bulb, proximal ECA, and ICA origin with markedly elevated peak systolic velocities in the proximal ICA with spectral broadening, and decreased systolic upstroke distally. RIGHT VERTEBRAL ARTERY: Patent with antegrade flow, delayed systolic upstroke. LEFT CAROTID ARTERY: Mild tortuosity. Calcified plaque in the bulb, proximal ECA, and ICA origin resulting in at least mild stenosis. No focal aliasing. There is a delayed systolic upstroke in the mid and distal ICA. LEFT VERTEBRAL ARTERY:  Normal flow direction and waveform. IMPRESSION: 1. 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 2. Left carotid bifurcation plaque resulting in 50-69% ICA stenosis. 3. Patent vertebral arteries with antegrade flow.  Electronically Signed   By: Corlis Leak M.D.   On: 04/09/2023 20:10   DG Chest Port 1 View  Result Date: 04/09/2023 CLINICAL DATA:  Chest pain EXAM: PORTABLE CHEST 1 VIEW COMPARISON:  None Available. FINDINGS: The heart size and mediastinal contours are within normal limits. Both lungs are clear. The visualized skeletal structures are unremarkable. IMPRESSION: No active disease. Electronically Signed   By: Jasmine Pang M.D.   On: 04/09/2023 00:31    Microbiology: Results for orders placed or performed during the hospital encounter of 04/08/23  Resp panel by RT-PCR (RSV, Flu A&B, Covid) Anterior Nasal Swab     Status: None   Collection Time: 04/09/23 12:06 AM   Specimen: Anterior Nasal Swab  Result Value Ref Range Status   SARS Coronavirus 2 by RT PCR NEGATIVE NEGATIVE Final    Comment: (NOTE) SARS-CoV-2 target nucleic acids are NOT DETECTED.  The SARS-CoV-2 RNA is generally detectable in upper respiratory specimens during the acute phase of infection. The lowest concentration of SARS-CoV-2 viral copies this assay can detect is 138 copies/mL. A negative result does not preclude SARS-Cov-2 infection and should not be used as the sole basis for treatment or other patient management decisions. A negative result may occur with  improper specimen collection/handling, submission of specimen other than nasopharyngeal swab, presence of viral mutation(s) within the areas targeted by this assay, and inadequate number of viral copies(<138 copies/mL). A negative result must be combined with clinical observations, patient history, and epidemiological information. The expected result is Negative.  Fact Sheet for Patients:  BloggerCourse.com  Fact Sheet for Healthcare Providers:  SeriousBroker.it  This test is no t yet approved or cleared by the [place] FDA and  has been authorized for detection and/or diagnosis of SARS-CoV-2 by FDA under an  Emergency Use Authorization (EUA). This EUA will remain  in effect (meaning this test can be used) for the duration of the COVID-19 declaration under Section 564(b)(1) of the Act, 21 U.S.C.section 360bbb-3(b)(1), unless the authorization is terminated  or revoked sooner.       Influenza A by PCR NEGATIVE NEGATIVE Final   Influenza B by PCR NEGATIVE NEGATIVE Final    Comment: (NOTE) The Xpert Xpress SARS-CoV-2/FLU/RSV plus assay is intended as an aid in the diagnosis of influenza from Nasopharyngeal swab specimens and should not be used as a sole basis for treatment. Nasal washings and aspirates are unacceptable for Xpert Xpress SARS-CoV-2/FLU/RSV testing.  Fact Sheet for Patients: BloggerCourse.com  Fact Sheet for Healthcare Providers: SeriousBroker.it  This test is not yet approved or cleared by the [place] FDA and has been authorized for detection and/or diagnosis of SARS-CoV-2 by FDA under an  Emergency Use Authorization (EUA). This EUA will remain in effect (meaning this test can be used) for the duration of the COVID-19 declaration under Section 564(b)(1) of the Act, 21 U.S.C. section 360bbb-3(b)(1), unless the authorization is terminated or revoked.     Resp Syncytial Virus by PCR NEGATIVE NEGATIVE Final    Comment: (NOTE) Fact Sheet for Patients: BloggerCourse.com  Fact Sheet for Healthcare Providers: SeriousBroker.it  This test is not yet approved or cleared by the [place] FDA and has been authorized for detection and/or diagnosis of SARS-CoV-2 by FDA under an Emergency Use Authorization (EUA). This EUA will remain in effect (meaning this test can be used) for the duration of the COVID-19 declaration under Section 564(b)(1) of the Act, 21 U.S.C. section 360bbb-3(b)(1), unless the authorization is terminated or revoked.  Performed at [facility], [address] [place], [place] [address]     Labs: CBC: Recent Labs  Lab 04/09/23 0006 04/10/23 0254 04/11/23 0357 04/11/23 0923 04/11/23 0925 04/12/23 0537  WBC 6.5 6.9 6.5  --   --  6.5  NEUTROABS 4.5  --   --   --   --   --   HGB 13.8 12.5 12.9 13.6 13.6 12.9  HCT 43.1 37.9 39.1 40.0 40.0 38.4  MCV 91.7 91.1 89.5  --   --  89.3  PLT 200 177 186  --   --  189   Basic Metabolic Panel: Recent Labs  Lab 04/09/23 0006 04/11/23 0357 04/11/23 0923 04/11/23 0925  NA 139 138 140 140  K 3.4* 4.1 4.1 4.0  CL 102 106  --   --   CO2 23 26  --   --   GLUCOSE 204* 91  --   --   BUN 21 17  --   --   CREATININE 0.95 0.99  --   --   CALCIUM 8.9 8.5*  --   --    Liver Function Tests: Recent Labs  Lab 04/09/23 0006  AST 27  ALT 25  ALKPHOS 60  BILITOT 0.8  PROT 6.6  ALBUMIN 3.9   CBG: No results for input(s): "GLUCAP" in the last 168 hours.  Discharge time spent: 38 minutes.  Signed: Marcelino Duster, MD [facility] 04/12/2023

## 2023-04-17 NOTE — Progress Notes (Unsigned)
Cardiology Office Note:    Date:  04/18/2023   ID:  Hannah Hooper, DOB August 21, 1931, MRN [id number]  PCP:  Lauro Regulus, MD   [facility] Providers Cardiologist:  None     Referring MD: Lauro Regulus, MD   Chief Complaint  Patient presents with   Aortic Stenosis    History of Present Illness:    Hannah Hooper is a 89 y.o. female referred for evaluation of aortic stenosis.  The patient was in her normal state of health when she recently developed symptomatic atrial fibrillation with RVR presenting with chest pain and shortness of breath.  She was hospitalized at [facility] and her high-sensitivity troponin increased to greater than 10,000.  She was treated with a heparin drip and IV diltiazem before converting back into sinus rhythm spontaneously.  She underwent echo and cardiac catheterization studies during her hospitalization.  The studies demonstrated findings consistent with severe aortic stenosis and moderate aorto ostial coronary artery disease involving the RCA and the left main without high-grade coronary obstruction.  The patient was transition to apixaban after her heart catheterization and she has done well since hospital discharge.  The patient is here with her daughter in law today. She recalls having a heart murmur for many years. She has mild dyspnea with exertion but no other exertional problems. Today, she denies symptoms of palpitations, chest pain, shortness of breath, orthopnea, PND, lower extremity edema, dizziness, or syncope with her normal activities. She lives with her son and a friend. She has had regular dental care and reports no problems.  Other than the symptoms that occurred while she was in atrial fibrillation with RVR, they both report that she really does not have much trouble and she remains physically active considering her advanced age.  Past Medical History:  Diagnosis Date   Benign neoplasm of colon, unspecified     Carotid artery disease (HCC)    Diverticulosis    Hyperlipemia    Hypertension    Hypertensive kidney disease with CKD stage III (HCC)    Lesion of ulnar nerve    Osteoarthritis    Valvular heart disease    a. 02/2020 Echo: EF 50%, triv AI, mild AS/MR/MS/TR.    Past Surgical History:  Procedure Laterality Date   bunions  1990   CARPOMETACARPAL [facility]) FUSION OF THUMB Left 06/24/2016   Procedure: CARPOMETACARPAL [facility]) ARthroplasty OF THUMB;  Surgeon: Kennedy Bucker, MD;  Location: [facility] ORS;  Service: Orthopedics;  Laterality: Left;   CATARACT EXTRACTION Bilateral    EYE SURGERY Bilateral 2014   cataract surgery   GAS/FLUID EXCHANGE Left 03/02/2018   Procedure: GAS/FLUID EXCHANGE;  Surgeon: Rennis Chris, MD;  Location: [facility] OR;  Service: Ophthalmology;  Laterality: Left;   MEMBRANE PEEL Left 03/02/2018   Procedure: MEMBRANE PEEL;  Surgeon: Rennis Chris, MD;  Location: [facility] OR;  Service: Ophthalmology;  Laterality: Left;   PARS PLANA VITRECTOMY Left 03/02/2018   Procedure: PARS PLANA VITRECTOMY WITH 25 GAUGE;  Surgeon: Rennis Chris, MD;  Location: [facility] OR;  Service: Ophthalmology;  Laterality: Left;   RIGHT/LEFT HEART CATH AND CORONARY ANGIOGRAPHY N/A 04/11/2023   Procedure: RIGHT/LEFT HEART CATH AND CORONARY ANGIOGRAPHY;  Surgeon: Iran Ouch, MD;  Location: [facility] INVASIVE CV LAB;  Service: Cardiovascular;  Laterality: N/A;   SHOULDER ARTHROSCOPY Right 2014   TONSILLECTOMY     as a child   ULNAR NERVE TRANSPOSITION Right     Current Medications: Current Meds  Medication Sig  amLODipine (NORVASC) 5 MG tablet Take 1 tablet (5 mg total) by mouth daily.   apixaban (ELIQUIS) 2.5 MG TABS tablet Take 1 tablet (2.5 mg total) by mouth 2 (two) times daily.   aspirin 81 MG chewable tablet Chew 1 tablet (81 mg total) by mouth daily.   atorvastatin (LIPITOR) 40 MG tablet Take 1 tablet (40 mg total) by mouth daily.   cyanocobalamin (VITAMIN B12) 1000 MCG tablet Take 1 tablet (1,000 mcg total) by  mouth daily.   metoprolol tartrate (LOPRESSOR) 50 MG tablet Take 1 tablet (50 mg total) by mouth 2 (two) times daily.   sulfaSALAzine (AZULFIDINE) 500 MG tablet Take 2 tablets (1,000 mg total) by mouth daily.     Allergies:   Patient has no known allergies.   Social History   Socioeconomic History   Marital status: Widowed    Spouse name: Not on file   Number of children: Not on file   Years of education: Not on file   Highest education level: Not on file  Occupational History   Not on file  Tobacco Use   Smoking status: Never   Smokeless tobacco: Never  Vaping Use   Vaping status: Never Used  Substance and Sexual Activity   Alcohol use: Not Currently    Comment: occasionally mixed drink   Drug use: No   Sexual activity: Never  Other Topics Concern   Not on file  Social History Narrative   Lives locally with son and friend.  Very active.  Does not routinely exercise.  Still drives.   Social Determinants of Health   Financial Resource Strain: Not on file  Food Insecurity: Not on file  Transportation Needs: Not on file  Physical Activity: Not on file  Stress: Not on file  Social Connections: Not on file     Family History: The patient's family history includes Breast cancer (age of onset: 32) in her mother.  ROS:   Please see the history of present illness.    All other systems reviewed and are negative.  EKGs/Labs/Other Studies Reviewed:    The following studies were reviewed today: Echo 04/11/2023:  1. Left ventricular ejection fraction, by estimation, is 55 to 60%. The  left ventricle has normal function. The left ventricle has no regional  wall motion abnormalities. There is moderate left ventricular hypertrophy.  Left ventricular diastolic  parameters are consistent with Grade II diastolic dysfunction  (pseudonormalization).   2. Right ventricular systolic function is normal. The right ventricular  size is normal.   3. Left atrial size was mildly  dilated.   4. The mitral valve is normal in structure. Moderate mitral valve  regurgitation. No evidence of mitral stenosis. Severe mitral annular  calcification.   5. The aortic valve is calcified. Aortic valve regurgitation is not  visualized. Severe aortic valve stenosis. Aortic valve mean gradient  measures 31.0 mmHg. calculated area was 1.2. However, suspect that the  recorded gradiet was not accurate and  underestimate severity.   6. The inferior vena cava is normal in size with greater than 50%  respiratory variability, suggesting right atrial pressure of 3 mmHg.      Cardiac Cath:   Prox RCA to Mid RCA lesion is 40% stenosed.   Ost RCA to Prox RCA lesion is 60% stenosed.   Ost LM lesion is 40% stenosed.   The left ventricular systolic function is normal.   LV end diastolic pressure is mildly elevated.   The left ventricular ejection fraction is  55-65% by visual estimate.   1.  Moderate heavily calcified ostial RCA and left main coronary artery disease.  No evidence of obstructive lesions. 2.  Normal LV systolic function with mild apical hypokinesis. 3.  Severe aortic stenosis with mean gradient of 36 mmHg and calculated valve area of 0.6 cm. 4.  Right heart catheterization showed mildly elevated wedge pressure, mild pulmonary hypertension and low normal cardiac output.   RA: 6 mmHg RV: 39/1 mmHg PW: 20 mmHg PA: 37/15 with a mean of 26 mmHg Cardiac output is 4 with an index of 2.66.   Recommendations: Suspect that elevated troponin was due to supply demand ischemia in the setting of severe underlying aortic stenosis and atrial fibrillation with rapid ventricular response.  Ostial coronary artery disease does not require revascularization. Resume heparin 2 hours after removal of TR band.  Plan to transition to Eliquis tomorrow morning. Recommend outpatient evaluation for TAVR.  Recent Labs: 04/09/2023: ALT 25; B Natriuretic Peptide 574.6; TSH 3.501 04/11/2023: BUN 17;  Creatinine, Ser 0.99; Potassium 4.0; Sodium 140 04/12/2023: Hemoglobin 12.9; Platelets 189  Recent Lipid Panel No results found for: "CHOL", "TRIG", "HDL", "CHOLHDL", "VLDL", "LDLCALC", "LDLDIRECT"   Risk Assessment/Calculations:    CHA2DS2-VASc Score = 5   This indicates a 7.2% annual risk of stroke. The patient's score is based upon: CHF History: 0 HTN History: 1 Diabetes History: 0 Stroke History: 0 Vascular Disease History: 1 Age Score: 2 Gender Score: 1               Physical Exam:    VS:  BP 134/60   Pulse 60   Ht 5' (1.524 m)   Wt 119 lb 6.4 oz (54.2 kg)   SpO2 96%   BMI 23.32 kg/m     Wt Readings from Last 3 Encounters:  04/18/23 119 lb 6.4 oz (54.2 kg)  04/09/23 120 lb (54.4 kg)  03/02/18 115 lb (52.2 kg)     GEN:  Well nourished, well developed pleasant elderly woman in no acute distress HEENT: Normal NECK: No JVD; No carotid bruits LYMPHATICS: No lymphadenopathy CARDIAC: RRR, 3/6 harsh crescendo decrescendo murmur at the right upper sternal border with diminished A2 RESPIRATORY:  Clear to auscultation without rales, wheezing or rhonchi  ABDOMEN: Soft, non-tender, non-distended MUSCULOSKELETAL:  No edema; No deformity  SKIN: Warm and dry NEUROLOGIC:  Alert and oriented x 3 PSYCHIATRIC:  Normal affect   ASSESSMENT:    1. Severe aortic stenosis    PLAN:    In order of problems listed above:  The patient has severe, stage D1 aortic stenosis.  This is associated with a recent hospitalization for demand ischemia in the setting of atrial fibrillation with RVR, moderate ostial coronary artery disease, and severe aortic stenosis.  Symptoms resolved with restoration of sinus rhythm.  Echo demonstrated normal LV systolic function with LVEF 55 to 60%, grade 2 diastolic dysfunction, normal RV function, calcified and restricted aortic valve leaflets with a mean gradient of 31 mmHg peak transaortic velocity of 3.3 m/s, and calculated aortic valve area of 1.2  cm.  The interpretation reported that the gradients were felt to be underestimated.  Cardiac catheterization demonstrated moderate ostial RCA stenosis of 60%, ostial left main stenosis of 40%, mean transaortic gradient of 36 mmHg, and calculated aortic valve area of 0.6 cm.  While there is some discrepancy in the data between the cardiac catheterization and the echo, the patient's clinical presentation, physical exam, and catheterization data, all suggest severe aortic stenosis. I  have reviewed the natural history of aortic stenosis with the patient and their family members who are present today. We have discussed the limitations of medical therapy and the poor prognosis associated with symptomatic aortic stenosis. We have reviewed potential treatment options, including palliative medical therapy, conventional surgical aortic valve replacement, and transcatheter aortic valve replacement. We discussed treatment options in the context of the patient's specific comorbid medical conditions.  After discussion of treatment options, the patient would like to proceed with TAVR evaluation.  She will undergo a gated CTA of the heart as well as a CTA of the chest, abdomen, and pelvis to evaluate both cardiac and peripheral anatomy suitability for TAVR.  Once her studies are all completed, she will undergo formal cardiac surgical evaluation as part of a multidisciplinary review of her case.    Cardiac Rehabilitation Eligibility Assessment  The patient is NOT ready to start cardiac rehabilitation due to: Other (severe aortic stenosis, not yet treated)          Medication Adjustments/Labs and Tests Ordered: Current medicines are reviewed at length with the patient today.  Concerns regarding medicines are outlined above.  No orders of the defined types were placed in this encounter.  No orders of the defined types were placed in this encounter.   Patient Instructions  Medication Instructions:  Your physician  recommends that you continue on your current medications as directed. Please refer to the Current Medication list given to you today.  *If you need a refill on your cardiac medications before your next appointment, please call your pharmacy*  Lab Work: NONE If you have labs (blood work) drawn today and your tests are completely normal, you will receive your results only by: MyChart Message (if you have MyChart) OR A paper copy in the mail If you have any lab test that is abnormal or we need to change your treatment, we will call you to review the results.  Testing/Procedures: TAVR CTA's (you will be called to schedule)  Surgical Referral (you will be called to schedule)  Follow-Up: At [facility], you and your health needs are our priority.  As part of our continuing mission to provide you with exceptional heart care, we have created designated Provider Care Teams.  These Care Teams include your primary Cardiologist (physician) and Advanced Practice Providers (APPs -  Physician Assistants and Nurse Practitioners) who all work together to provide you with the care you need, when you need it.   Your next appointment:   Structural Team will follow-up  Provider:   Tonny Bollman, MD     Signed, Tonny Bollman, MD  04/18/2023 5:55 PM    [facility]

## 2023-04-18 ENCOUNTER — Other Ambulatory Visit: Payer: Self-pay

## 2023-04-18 ENCOUNTER — Ambulatory Visit: Payer: Medicare (Managed Care) | Attending: Cardiovascular Disease | Admitting: Cardiovascular Disease

## 2023-04-18 ENCOUNTER — Encounter: Payer: Self-pay | Admitting: Cardiovascular Disease

## 2023-04-18 VITALS — BP 134/60 | HR 60 | Ht 60.0 in | Wt 119.4 lb

## 2023-04-18 DIAGNOSIS — I350 Nonrheumatic aortic (valve) stenosis: Secondary | ICD-10-CM

## 2023-04-18 NOTE — Progress Notes (Signed)
Pre Surgical Assessment: 5 M Walk Test  15M=16.20ft  5 Meter Walk Test- trial 1: 4.98 seconds 5 Meter Walk Test- trial 2: 4.97 seconds 5 Meter Walk Test- trial 3: 5.62 seconds 5 Meter Walk Test Average: 5.19 seconds

## 2023-04-18 NOTE — Patient Instructions (Signed)
Medication Instructions:  Your physician recommends that you continue on your current medications as directed. Please refer to the Current Medication list given to you today.  *If you need a refill on your cardiac medications before your next appointment, please call your pharmacy*  Lab Work: NONE If you have labs (blood work) drawn today and your tests are completely normal, you will receive your results only by: MyChart Message (if you have MyChart) OR A paper copy in the mail If you have any lab test that is abnormal or we need to change your treatment, we will call you to review the results.  Testing/Procedures: TAVR CTA's (you will be called to schedule)  Surgical Referral (you will be called to schedule)  Follow-Up: At [facility], you and your health needs are our priority.  As part of our continuing mission to provide you with exceptional heart care, we have created designated Provider Care Teams.  These Care Teams include your primary Cardiologist (physician) and Advanced Practice Providers (APPs -  Physician Assistants and Nurse Practitioners) who all work together to provide you with the care you need, when you need it.   Your next appointment:   Structural Team will follow-up  Provider:   Tonny Bollman, MD

## 2023-04-22 ENCOUNTER — Ambulatory Visit (HOSPITAL_COMMUNITY)
Admission: RE | Admit: 2023-04-22 | Discharge: 2023-04-22 | Disposition: A | Discharge status: Home / Self Care | Payer: Medicare (Managed Care) | Source: Ambulatory Visit | Attending: Cardiovascular Disease | Admitting: Cardiovascular Disease

## 2023-04-22 DIAGNOSIS — I350 Nonrheumatic aortic (valve) stenosis: Secondary | ICD-10-CM | POA: Diagnosis present

## 2023-04-22 MED ORDER — IOHEXOL 350 MG/ML SOLN
100.0000 mL | Freq: Once | INTRAVENOUS | Status: AC | PRN
  Administered 2023-04-22: 100 mL via INTRAVENOUS

## 2023-04-25 ENCOUNTER — Ambulatory Visit
Admission: RE | Admit: 2023-04-25 | Discharge: 2023-04-25 | Disposition: A | Discharge status: Home / Self Care | Payer: Medicare (Managed Care) | Source: Ambulatory Visit | Attending: Internal Medicine | Admitting: Internal Medicine

## 2023-04-25 DIAGNOSIS — Z1231 Encounter for screening mammogram for malignant neoplasm of breast: Secondary | ICD-10-CM | POA: Insufficient documentation

## 2023-04-27 ENCOUNTER — Telehealth: Payer: Self-pay | Admitting: Physician Assistant

## 2023-04-27 NOTE — Progress Notes (Deleted)
[address]       [place] [address]             [phone]           [facility] Medical Record #[id number] Date of Birth: 1932/02/04  Hannah Ouch, MD Lauro Regulus, MD  Chief Complaint:     History of Present Illness:           Past Medical History:  Diagnosis Date   Benign neoplasm of colon, unspecified    Carotid artery disease (HCC)    Diverticulosis    Hyperlipemia    Hypertension    Hypertensive kidney disease with CKD stage III (HCC)    Lesion of ulnar nerve    Osteoarthritis    Valvular heart disease    a. 02/2020 Echo: EF 50%, triv AI, mild AS/MR/MS/TR.    Past Surgical History:  Procedure Laterality Date   bunions  1990   CARPOMETACARPAL [facility]) FUSION OF THUMB Left 06/24/2016   Procedure: CARPOMETACARPAL [facility]) ARthroplasty OF THUMB;  Surgeon: Kennedy Bucker, MD;  Location: [facility] ORS;  Service: Orthopedics;  Laterality: Left;   CATARACT EXTRACTION Bilateral    EYE SURGERY Bilateral 2014   cataract surgery   GAS/FLUID EXCHANGE Left 03/02/2018   Procedure: GAS/FLUID EXCHANGE;  Surgeon: Rennis Chris, MD;  Location: [facility] OR;  Service: Ophthalmology;  Laterality: Left;   MEMBRANE PEEL Left 03/02/2018   Procedure: MEMBRANE PEEL;  Surgeon: Rennis Chris, MD;  Location: [facility] OR;  Service: Ophthalmology;  Laterality: Left;   PARS PLANA VITRECTOMY Left 03/02/2018   Procedure: PARS PLANA VITRECTOMY WITH 25 GAUGE;  Surgeon: Rennis Chris, MD;  Location: [facility] OR;  Service: Ophthalmology;  Laterality: Left;   RIGHT/LEFT HEART CATH AND CORONARY ANGIOGRAPHY N/A 04/11/2023   Procedure: RIGHT/LEFT HEART CATH AND CORONARY ANGIOGRAPHY;  Surgeon: Hannah Ouch, MD;  Location: [facility] INVASIVE CV LAB;  Service: Cardiovascular;  Laterality: N/A;   SHOULDER ARTHROSCOPY Right 2014   TONSILLECTOMY     as a child   ULNAR NERVE TRANSPOSITION Right     Social History   Tobacco Use  Smoking Status Never  Smokeless Tobacco Never    Social  History   Substance and Sexual Activity  Alcohol Use Not Currently   Comment: occasionally mixed drink    Social History   Socioeconomic History   Marital status: Widowed    Spouse name: Not on file   Number of children: Not on file   Years of education: Not on file   Highest education level: Not on file  Occupational History   Not on file  Tobacco Use   Smoking status: Never   Smokeless tobacco: Never  Vaping Use   Vaping status: Never Used  Substance and Sexual Activity   Alcohol use: Not Currently    Comment: occasionally mixed drink   Drug use: No   Sexual activity: Never  Other Topics Concern   Not on file  Social History Narrative   Lives locally with son and friend.  Very active.  Does not routinely exercise.  Still drives.   Social Determinants of Health   Financial Resource Strain: Not on file  Food Insecurity: Not on file  Transportation Needs: Not on file  Physical Activity: Not on file  Stress: Not on file  Social Connections: Not on file  Intimate Partner Violence: Not on file    No Known Allergies  Current Outpatient Medications  Medication Sig Dispense Refill  amLODipine (NORVASC) 5 MG tablet Take 1 tablet (5 mg total) by mouth daily. 30 tablet 3   apixaban (ELIQUIS) 2.5 MG TABS tablet Take 1 tablet (2.5 mg total) by mouth 2 (two) times daily. 60 tablet 3   aspirin 81 MG chewable tablet Chew 1 tablet (81 mg total) by mouth daily. 30 tablet 3   atorvastatin (LIPITOR) 40 MG tablet Take 1 tablet (40 mg total) by mouth daily. 30 tablet 3   cyanocobalamin (VITAMIN B12) 1000 MCG tablet Take 1 tablet (1,000 mcg total) by mouth daily. 30 tablet 3   metoprolol tartrate (LOPRESSOR) 50 MG tablet Take 1 tablet (50 mg total) by mouth 2 (two) times daily. 303 tablet 2   sulfaSALAzine (AZULFIDINE) 500 MG tablet Take 2 tablets (1,000 mg total) by mouth daily. 60 tablet 0   No current facility-administered medications for this visit.     Family History   Problem Relation Age of Onset   Breast cancer Mother 26       Physical Exam:      Diagnostic Studies & Laboratory data: I have personally reviewed the following studies and agree with the findings   Cath (03/2023) Conclusion      Prox RCA to Mid RCA lesion is 40% stenosed.   Ost RCA to Prox RCA lesion is 60% stenosed.   Ost LM lesion is 40% stenosed.   The left ventricular systolic function is normal.   LV end diastolic pressure is mildly elevated.   The left ventricular ejection fraction is 55-65% by visual estimate.   1.  Moderate heavily calcified ostial RCA and left main coronary artery disease.  No evidence of obstructive lesions. 2.  Normal LV systolic function with mild apical hypokinesis. 3.  Severe aortic stenosis with mean gradient of 36 mmHg and calculated valve area of 0.6 cm. 4.  Right heart catheterization showed mildly elevated wedge pressure, mild pulmonary hypertension and low normal cardiac output.   TTE (03/2023) IMPRESSIONS     1. Left ventricular ejection fraction, by estimation, is 55 to 60%. The  left ventricle has normal function. The left ventricle has no regional  wall motion abnormalities. There is moderate left ventricular hypertrophy.  Left ventricular diastolic  parameters are consistent with Grade II diastolic dysfunction  (pseudonormalization).   2. Right ventricular systolic function is normal. The right ventricular  size is normal.   3. Left atrial size was mildly dilated.   4. The mitral valve is normal in structure. Moderate mitral valve  regurgitation. No evidence of mitral stenosis. Severe mitral annular  calcification.   5. The aortic valve is calcified. Aortic valve regurgitation is not  visualized. Severe aortic valve stenosis. Aortic valve mean gradient  measures 31.0 mmHg. calculated area was 1.2. However, suspect that the  recorded gradiet was not accurate and  underestimate severity.   6. The inferior vena cava is normal  in size with greater than 50%  respiratory variability, suggesting right atrial pressure of 3 mmHg.   FINDINGS   Left Ventricle: Left ventricular ejection fraction, by estimation, is 55  to 60%. The left ventricle has normal function. The left ventricle has no  regional wall motion abnormalities. The left ventricular internal cavity  size was normal in size. There is   moderate left ventricular hypertrophy. Left ventricular diastolic  parameters are consistent with Grade II diastolic dysfunction  (pseudonormalization).   Right Ventricle: The right ventricular size is normal. No increase in  right ventricular wall thickness. Right ventricular systolic  function is  normal.   Left Atrium: Left atrial size was mildly dilated.   Right Atrium: Right atrial size was normal in size.   Pericardium: There is no evidence of pericardial effusion.   Mitral Valve: The mitral valve is normal in structure. There is moderate  thickening of the mitral valve leaflet(s). There is moderate calcification  of the mitral valve leaflet(s). Severe mitral annular calcification.  Moderate mitral valve regurgitation.  No evidence of mitral valve stenosis. MV peak gradient, 7.0 mmHg. The mean  mitral valve gradient is 4.0 mmHg.   Tricuspid Valve: The tricuspid valve is normal in structure. Tricuspid  valve regurgitation is mild . No evidence of tricuspid stenosis.   Aortic Valve: The aortic valve is calcified. Aortic valve regurgitation is  not visualized. Severe aortic stenosis is present. Aortic valve mean  gradient measures 31.0 mmHg. Aortic valve peak gradient measures 43.3  mmHg. Aortic valve area, by VTI measures   1.50 cm.   Pulmonic Valve: The pulmonic valve was normal in structure. Pulmonic valve  regurgitation is not visualized. No evidence of pulmonic stenosis.   Aorta: The aortic root is normal in size and structure.   Venous: The inferior vena cava is normal in size with greater than 50%   respiratory variability, suggesting right atrial pressure of 3 mmHg.   IAS/Shunts: No atrial level shunt detected by color flow Doppler.     LEFT VENTRICLE  PLAX 2D  LVIDd:         3.70 cm   Diastology  LVIDs:         2.70 cm   LV e' medial:    5.44 cm/s  LV PW:         1.30 cm   LV E/e' medial:  25.6  LV IVS:        1.40 cm   LV e' lateral:   4.03 cm/s  LVOT diam:     1.95 cm   LV E/e' lateral: 34.5  LV SV:         87  LV SV Index:   58  LVOT Area:     2.99 cm     RIGHT VENTRICLE  RV Basal diam:  2.90 cm  RV Mid diam:    1.50 cm  RV S prime:     17.20 cm/s  TAPSE (M-mode): 2.2 cm   LEFT ATRIUM             Index        RIGHT ATRIUM          Index  LA diam:        4.30 cm 2.86 cm/m   RA Area:     7.99 cm  LA Vol (A2C):   47.3 ml 31.48 ml/m  RA Volume:   12.50 ml 8.32 ml/m  LA Vol (A4C):   52.8 ml 35.15 ml/m  LA Biplane Vol: 51.9 ml 34.55 ml/m   AORTIC VALVE  AV Area (Vmax):    1.29 cm  AV Area (Vmean):   1.24 cm  AV Area (VTI):     1.50 cm  AV Vmax:           329.00 cm/s  AV Vmean:          226.000 cm/s  AV VTI:            0.578 m  AV Peak Grad:      43.3 mmHg  AV Mean Grad:      31.0  mmHg  LVOT Vmax:         142.00 cm/s  LVOT Vmean:        94.100 cm/s  LVOT VTI:          0.290 m  LVOT/AV VTI ratio: 0.50    AORTA  Ao Root diam: 2.65 cm   MITRAL VALVE                  TRICUSPID VALVE  MV Area (PHT): 2.63 cm       TR Peak grad:   29.8 mmHg  MV Area VTI:   2.09 cm       TR Vmax:        273.00 cm/s  MV Peak grad:  7.0 mmHg  MV Mean grad:  4.0 mmHg       SHUNTS  MV Vmax:       1.32 m/s       Systemic VTI:  0.29 m  MV Vmean:      87.3 cm/s      Systemic Diam: 1.95 cm  MV Decel Time: 288 msec  MR Peak grad:    150.8 mmHg  MR Mean grad:    69.0 mmHg  MR Vmax:         614.00 cm/s  MR Vmean:        362.0 cm/s  MR PISA:         4.02 cm  MR PISA Eff ROA: 20 mm  MR PISA Radius:  0.80 cm  MV E velocity: 139.00 cm/s  MV A velocity: 123.00 cm/s  MV E/A  ratio:  1.13    Recent Radiology Findings:   CTA (04/2023) FINDINGS: Aortic Valve:   Tricuspid aortic valve with severely reduced cusp excursion. Severely thickened and severely calcified aortic valve cusps.   AV calcium score: 1371, AV challenging to score due to contiguous aortic atherosclerosis and LVOT calcifications.   Virtual Basal Annulus Measurements:   Maximum/Minimum Diameter: 23.0 x 18.6 mm   Perimeter: 65.2 mm   Area: 323 mm2   LVOT calcifications below left coronary cusp.   Membranous septal length: 5 mm   Based on these measurements, the annulus would be suitable for a 20 mm Sapien 3 valve. Alternatively, Heart Team can consider 26 mm Evolut valve. Recommend Heart Team discussion for valve selection.   Sinus of Valsalva Measurements:   Non-coronary:  29 mm   Right - coronary:  28 mm   Left - coronary:  28 mm   Sinus of Valsalva Height:   Left: 19.1 mm   Right: 24 mm   Aorta: Conventional 3 vessel branch pattern of aortic arch. Severe aortic atherosclerosis.   Sinotubular Junction:  23 mm   Ascending Thoracic Aorta:  26 mm   Aortic Arch:  20 mm   Descending Thoracic Aorta:  22 mm   Coronary Artery Height above Annulus:   Left main: 13.6 mm   Right coronary: 20.2 mm   Coronary Arteries: Normal coronary origin. Right dominance. The study was performed without use of NTG and insufficient for plaque evaluation. Coronary artery calcium seen in 3 vessel distribution.   Optimum Fluoroscopic Angle for Delivery: LAO 10 CAU 10   OTHER:   Atria:   Left atrial appendage: No thrombus.   Mitral valve: Grossly normal, severe mitral annular calcifications.   Pulmonary artery: Normal caliber.   Pulmonary veins: Normal anatomy.   IMPRESSION: 1. Tricuspid aortic valve with severely reduced cusp excursion. Severely thickened and severely  calcified aortic valve cusps. 2. Aortic valve calcium score: 1371 3. Annulus area: 323 mm2, suitable for  20 mm Sapien 3 valve vs 26 mm Evolut valve. Mild LVOT calcifications. Membranous septal length 5 mm. 4. Sufficient coronary artery heights from annulus. 5. Optimum fluoroscopic angle for delivery: LAO 10 CAU 10      Recent Lab Findings: Lab Results  Component Value Date   WBC 6.5 04/12/2023   HGB 12.9 04/12/2023   HCT 38.4 04/12/2023   PLT 189 04/12/2023   GLUCOSE 91 04/11/2023   ALT 25 04/09/2023   AST 27 04/09/2023   NA 140 04/11/2023   K 4.0 04/11/2023   CL 106 04/11/2023   CREATININE 0.99 04/11/2023   BUN 17 04/11/2023   CO2 26 04/11/2023   TSH 3.501 04/09/2023   INR 0.9 04/09/2023      Assessment / Plan:        I have spent *** min in review of the records, viewing studies and in face to face with patient and in coordination of future care    Eugenio Hoes 04/27/2023 9:29 AM

## 2023-04-27 NOTE — Telephone Encounter (Signed)
  [facility]   MULTIDISCIPLINARY HEART VALVE TEAM   The patient was recently referred to [place] for consideration of TAVR. Pre TAVR CT scans did not show any viable access points for TAVR. Case discussed at our multidisciplinary valve team meeting and she is not felt to be a candidate for TAVR given advanced age, comorbidities and poor vascular access. Dr. Excell Seltzer discussed with Dr. Kirke Corin and he is in agreement. Plan to continue medical management. I discussed this with the patient's son today who was very understanding and said he would like to give the news to his mother. I have cancelled her apt with Dr. Leafy Ro tomorrow and instructed her to keep her upcoming appointment with Carlos Levering.   Cline Crock PA-C  [facility]

## 2023-04-28 ENCOUNTER — Encounter: Payer: PRIVATE HEALTH INSURANCE | Admitting: Thoracic Surgery (Cardiothoracic Vascular Surgery)

## 2023-05-12 NOTE — Progress Notes (Signed)
Cardiology Clinic Note   Date: 05/13/2023 ID: Hannah [facility], DOB [birth year], MRN [id number]  Primary Cardiologist:  Hannah Bears, MD  Patient Profile    Hannah Hooper is a 89 y.o. female who presents to the clinic today for follow up.     Past medical history significant for: Nonobstructive CAD. R/LHC 04/11/2023 (NSTEMI): Proximal to mid RCA 40%.  Ostial to proximal RCA 60%.  Ostial LM 40%.  Mildly elevated LVEDP.  LVEF 55 to 65%.  Mildly elevated wedge pressure, mild pulmonary hypertension and low normal cardiac output.  RA 6 mmHg, RV 39/1 mmHg, PW 20 mmHg, PA 37/15 with a mean of 26 mmHg, cardiac output 4 with an index of 2.66.  Elevated troponin felt to be secondary to supply demand ischemia in the setting of severe underlying aortic stenosis and A-fib with RVR. Aortic stenosis. Echo 04/11/2023: EF 55 to 60%.  No RWMA.  Moderate LVH.  Grade II DD.  Normal RV function.  Mild LAE.  Moderate MR.  Severe MAC.  Severe aortic stenosis, mean gradient 31 mmHg.  (Suspect recorded gradient not accurate at underestimat severity). PAF. Onset September 2024. Carotid artery disease. Carotid ultrasound 04/09/2023: Right carotid bifurcation plaque resulting in > 70% ICA stenosis.  Left carotid bifurcation plaque resulting in 50 to 69% ICA stenosis.  Patent vertebral arteries. Hypertension. CKD stage IIIa. Dementia.  In summary, patient presented to the ED via EMS on 04/08/2023 with complaints of chest pain and palpitations.  Upon arrival of EMS she was found to be in A-fib with RVR, HR 120-190s.  Upon arrival EKG showed A-fib, 160 bpm, inferior infarct, 1 to 2 mm inferior ST elevation with lateral ST depression (new).  Initial labs: Hemoglobin 13.8, potassium 3.4, glucose 204, creatinine 0.95, BNP 575, TSH normal, negative respiratory panel, D-dimer 0.56.  Troponin 27, 116, 6206, 10,271.  Previous echo from August 2021 was reviewed which showed EF 50% with mild AS/MR/MS/TR.  Patient resides with her son and a  friend.  She has mild dementia but is active and still drives and does her own grocery shopping.  She was treated with Cardizem bolus followed by drip.  She converted to sinus rhythm with complete resolution of ST segment elevation.  Echo showed normal LV/RV function, severe aortic stenosis with mean gradient 31 mmHg (details above). LHC revealed moderate nonobstructive CAD and elevated troponin felt to be secondary to demand ischemia.  RHC showed mildly elevated wedge pressure, mild pulmonary hypertension and low normal cardiac output.  Patient was discharged on 04/12/2023.  Patient was seen by Dr. Excell Seltzer, structural heart team, on 04/18/2023 for TAVR evaluation.  She underwent pre-TAVR CTA which did not show any viable access points for TAVR and it was felt she was not a candidate for TAVR secondary to advanced age, comorbidities and poor vascular access.  This was discussed with patient's son who agreed with continuation of medical management.     History of Present Illness    Hannah Hooper is followed by Dr. Kirke Corin for the above outlined history.  Discussed the use of AI scribe software for clinical note transcription with the patient, who gave verbal consent to proceed.  The patient presents for follow-up accompanied by her son. Patient denies shortness of breath, dyspnea on exertion, lower extremity edema, orthopnea or PND. No chest pain, pressure, or tightness. No palpitations. She remains active, doing things around the house and attending church.        ROS: All other systems reviewed and  are otherwise negative except as noted in History of Present Illness.  Studies Reviewed    EKG Interpretation Date/Time:  Friday May 13 2023 11:19:22 EDT Ventricular Rate:  50 PR Interval:  158 QRS Duration:  80 QT Interval:  456 QTC Calculation: 415 R Axis:   12  Text Interpretation: Sinus bradycardia Minimal voltage criteria for LVH, may be normal variant ( Sokolow-Lyon ) ST & Marked T wave  abnormality, consider anterolateral ischemia When compared with ECG of 09-Apr-2023 04:38, Premature supraventricular complexes are no longer Present Vent. rate has decreased BY  26 BPM T wave inversion more evident in Lateral leads Confirmed by Carlos Levering [phone]) on 05/13/2023 11:33:05 AM    Risk Assessment/Calculations     CHA2DS2-VASc Score = 5   This indicates a 7.2% annual risk of stroke. The patient's score is based upon: CHF History: 0 HTN History: 1 Diabetes History: 0 Stroke History: 0 Vascular Disease History: 1 Age Score: 2 Gender Score: 1             Physical Exam    VS:  BP 130/60 (BP Location: Left Arm, Patient Position: Sitting, Cuff Size: Normal)   Pulse (!) 50   Ht 5' (1.524 m)   Wt 118 lb 6 oz (53.7 kg)   SpO2 97%   BMI 23.12 kg/m  , BMI Body mass index is 23.12 kg/m.  GEN: Well nourished, well developed, in no acute distress. Neck: No JVD or carotid bruits. Cardiac:  RRR. 3/6 systolic murmur. No rubs or gallops.   Respiratory:  Respirations regular and unlabored. Clear to auscultation without rales, wheezing or rhonchi. GI: Soft, nontender, nondistended. Extremities: Radials/DP/PT 2+ and equal bilaterally. No clubbing or cyanosis. No edema.  Skin: Warm and dry, no rash. Neuro: Strength intact.  Assessment & Plan      Moderate nonobstructive CAD LHC revealed moderate nonobstructive CAD in RCA and LM.  Patient denies chest pain, pressure or tightness. She stays active around her home and goes to church. EKG lateral T-wave changes more pronounced than previous. Discussed with patient's son who desires conservative management.  -Continue amlodipine, Lopressor, aspirin, Eliquis, atorvastatin. -Will add prn SL NTG.  -If patient develops chest pain would consider low dose isosorbide.   Aortic stenosis Echo September 2024 showed normal LV/RV function, severe aortic stenosis.  Patient underwent evaluation with Dr. Excell Seltzer on structural heart team for  TAVR but was found to not be a candidate and medical management was continued.  Patient remains asymptomatic. 3/6 systolic murmur on exam.  -Continue medical management.   PAF/bradycardia Onset September 2024.  Converted to sinus rhythm with Cardizem drip.  Patient denies palpitations. Denies spontaneous bleeding concerns. HR today 50 bpm.  -Decrease lopressor to 25 mg bid.  -Continue Eliquis. Appropriate Eliquis dose.  Carotid artery disease Carotid ultrasound September 2024 showed > 70% left ICA and 50 to 69% right ICA.   -Continue aspirin and Eliquis.  Hypertension BP today 130/60. Continue amlodipine and Lopressor.     Disposition: Decrease lopressor to 25 mg bid. Return in 3 months or sooner as needed.          Signed, Etta Grandchild. Eames Dibiasio, DNP, NP-C

## 2023-05-13 ENCOUNTER — Encounter: Payer: Self-pay | Admitting: Student

## 2023-05-13 ENCOUNTER — Ambulatory Visit: Payer: Medicare (Managed Care) | Attending: Student | Admitting: Student

## 2023-05-13 VITALS — BP 130/60 | HR 50 | Ht 60.0 in | Wt 118.4 lb

## 2023-05-13 DIAGNOSIS — I350 Nonrheumatic aortic (valve) stenosis: Secondary | ICD-10-CM

## 2023-05-13 DIAGNOSIS — I480 Paroxysmal atrial fibrillation: Secondary | ICD-10-CM

## 2023-05-13 DIAGNOSIS — I2510 Atherosclerotic heart disease of native coronary artery without angina pectoris: Secondary | ICD-10-CM

## 2023-05-13 DIAGNOSIS — I10 Essential (primary) hypertension: Secondary | ICD-10-CM

## 2023-05-13 DIAGNOSIS — R001 Bradycardia, unspecified: Secondary | ICD-10-CM | POA: Diagnosis not present

## 2023-05-13 DIAGNOSIS — I6523 Occlusion and stenosis of bilateral carotid arteries: Secondary | ICD-10-CM

## 2023-05-13 MED ORDER — METOPROLOL TARTRATE 25 MG PO TABS: 25.0000 mg | ORAL_TABLET | Freq: Two times a day (BID) | ORAL | 1 refills | Status: DC

## 2023-05-13 MED ORDER — NITROGLYCERIN 0.4 MG SL SUBL: 0.4000 mg | SUBLINGUAL_TABLET | SUBLINGUAL | 1 refills | Status: DC | PRN

## 2023-05-13 NOTE — Patient Instructions (Addendum)
Medication Instructions:  Take Nitroglycerin as needed for chest pain: Place one tablet under the tongue as needed every 5 minutes for chest pain. If you have to use three tablets with no relief, please call 911 or go to your closest Emergency Department.  DECREASE the Metoprolol Tartrate to 25 mg twice daily   *If you need a refill on your cardiac medications before your next appointment, please call your pharmacy*   Lab Work: None ordered If you have labs (blood work) drawn today and your tests are completely normal, you will receive your results only by: MyChart Message (if you have MyChart) OR A paper copy in the mail If you have any lab test that is abnormal or we need to change your treatment, we will call you to review the results.   Testing/Procedures: None ordered   Follow-Up: At [facility], you and your health needs are our priority.  As part of our continuing mission to provide you with exceptional heart care, we have created designated Provider Care Teams.  These Care Teams include your primary Cardiologist (physician) and Advanced Practice Providers (APPs -  Physician Assistants and Nurse Practitioners) who all work together to provide you with the care you need, when you need it.  We recommend signing up for the patient portal called "MyChart".  Sign up information is provided on this After Visit Summary.  MyChart is used to connect with patients for Virtual Visits (Telemedicine).  Patients are able to view lab/test results, encounter notes, upcoming appointments, etc.  Non-urgent messages can be sent to your provider as well.   To learn more about what you can do with MyChart, go to [email or web address].    Your next appointment:   3 month(s)  Provider:   You may see Lorine Bears, MD or one of the following Advanced Practice Providers on your designated Care Team:   Nicolasa Ducking, NP Eula Listen, PA-C Cadence Fransico Michael, PA-C Charlsie Quest, NP

## 2023-07-23 ENCOUNTER — Other Ambulatory Visit: Payer: Self-pay | Admitting: Student

## 2023-08-14 NOTE — Progress Notes (Deleted)
Cardiology Clinic Note   Date: 08/14/2023 ID: Shatira, Dobosz 1931-09-29, MRN [id number]  Primary Cardiologist:  Lorine Bears, MD  Patient Profile    Hannah Hooper is a 89 y.o. female who presents to the clinic today for ***    Past medical history significant for: CAD. R/LHC 04/11/2023: Proximal to mid RCA 40%.  Ostial to proximal RCA 60%.  Ostial LM 40%.  Mildly elevated LVEDP.  Moderate heavily calcified ostial RCA and left main coronary artery disease.  No evidence of obstructive lesion.  Normal LV systolic function with mild apical hypokinesis.  Severe aortic stenosis mean gradient 36 mmHg.  Right heart catheterization showed mildly elevated wedge pressure, mild pulmonary hypertension and low normal cardiac output.  Recommend outpatient evaluation for TAVR. PAF. Aortic stenosis. Echo 04/11/2023: EF 55 to 60%.  No RWMA.  Moderate LVH.  Grade II DD.  Normal RV size/function.  Mild LAE.  Moderate MR.  Severe MAC.  Severe aortic stenosis mean gradient 31 mmHg.  (It was noted that recorded gradient may not be accurate secondary to underestimated severity). CKD stage IIIa.  In summary, patient presented to the ED via EMS on 04/08/2023 with complaints of chest pain and palpitations.  Upon arrival of EMS she was found to be in A-fib with RVR, HR 120-190s.  Upon arrival EKG showed A-fib, 160 bpm, inferior infarct, 1 to 2 mm inferior ST elevation with lateral ST depression (new).  Initial labs: Hemoglobin 13.8, potassium 3.4, glucose 204, creatinine 0.95, BNP 575, TSH normal, negative respiratory panel, D-dimer 0.56.  Troponin 27, 116, 6206, 10,271.  Previous echo from August 2021 was reviewed which showed EF 50% with mild AS/MR/MS/TR.  Patient resides with her son and a friend.  She has mild dementia but is active and still drives and does her own grocery shopping.  She was treated with Cardizem bolus followed by drip.  She converted to sinus rhythm with complete resolution of ST segment elevation.   Echo showed normal LV/RV function, severe aortic stenosis with mean gradient 31 mmHg (details above). LHC revealed moderate nonobstructive CAD and elevated troponin felt to be secondary to demand ischemia.  RHC showed mildly elevated wedge pressure, mild pulmonary hypertension and low normal cardiac output.  Patient was discharged on 04/12/2023.   Patient was seen by Dr. Excell Seltzer, structural heart team, on 04/18/2023 for TAVR evaluation.  She underwent pre-TAVR CTA which did not show any viable access points for TAVR and it was felt she was not a candidate for TAVR secondary to advanced age, comorbidities and poor vascular access.  This was discussed with patient's son who agreed with continuation of medical management.     History of Present Illness    Hannah Hooper is followed by Dr. Kirke Corin for the above outlined history.  Patient was last seen in the office by me on 05/13/2023 for routine follow-up.  Her EKG at that time showed lateral T wave changes more pronounced than previous EKG.  Patient denied chest pain, pressure, tightness.  She reported remaining active at home and going to church.  Patient and son agreed to conservative management.  As needed SL NTG was added.  She was maintaining sinus rhythm with heart rate 50 bpm.  Lopressor was decreased.  Today, patient ***  Moderate nonobstructive CAD LHC revealed moderate nonobstructive CAD in RCA and LM.  Patient*** -Continue amlodipine, Lopressor, aspirin, Eliquis, atorvastatin, as needed SL NTG.   Aortic stenosis Echo September 2024 showed normal LV/RV function, severe  aortic stenosis.  Patient underwent evaluation with Dr. Excell Seltzer on structural heart team for TAVR but was found to not be a candidate and medical management was continued.  Patient remains asymptomatic. 3/6 systolic murmur on exam.*** -Continue medical management.    PAF/bradycardia Onset September 2024.  Converted to sinus rhythm with Cardizem drip.  Patient denies palpitations.  Denies spontaneous bleeding concerns.*** -Continue Eliquis, Lopressor. Appropriate Eliquis dose. -CBC today***   Carotid artery disease Carotid ultrasound September 2024 showed > 70% left ICA and 50 to 69% right ICA.   -Continue aspirin and Eliquis.   Hypertension BP today***. -Continue amlodipine and Lopressor. -BMP today***   ROS: All other systems reviewed and are otherwise negative except as noted in History of Present Illness.  EKGs/Labs Reviewed        04/09/2023: ALT 25; AST 27 04/11/2023: BUN 17; Creatinine, Ser 0.99; Potassium 4.0; Sodium 140   04/12/2023: Hemoglobin 12.9; WBC 6.5   04/09/2023: TSH 3.501   04/09/2023: B Natriuretic Peptide 574.6  ***  Risk Assessment/Calculations    {Does this patient have ATRIAL FIBRILLATION?:[phone]} No BP recorded.  {Refresh Note OR Click here to enter BP  :1}***        Physical Exam    VS:  There were no vitals taken for this visit. , BMI There is no height or weight on file to calculate BMI.  GEN: Well nourished, well developed, in no acute distress. Neck: No JVD or carotid bruits. Cardiac: *** RRR. No murmurs. No rubs or gallops.   Respiratory:  Respirations regular and unlabored. Clear to auscultation without rales, wheezing or rhonchi. GI: Soft, nontender, nondistended. Extremities: Radials/DP/PT 2+ and equal bilaterally. No clubbing or cyanosis. No edema ***  Skin: Warm and dry, no rash. Neuro: Strength intact.  Assessment & Plan   ***  Disposition: ***     {Are you ordering a CV Procedure (e.g. stress test, cath, DCCV, TEE, etc)?   Press F2        :[id number]}   Signed, Etta Grandchild. Tamiki Kuba, DNP, NP-C

## 2023-08-15 ENCOUNTER — Ambulatory Visit: Payer: PRIVATE HEALTH INSURANCE | Attending: Student | Admitting: Student

## 2023-12-30 ENCOUNTER — Observation Stay
Admission: EM | Admit: 2023-12-30 | Discharge: 2024-01-01 | Disposition: A | Discharge status: Home / Self Care | Payer: Medicare (Managed Care) | Attending: Internal Medicine | Admitting: Internal Medicine

## 2023-12-30 ENCOUNTER — Other Ambulatory Visit: Payer: Self-pay

## 2023-12-30 DIAGNOSIS — I4891 Unspecified atrial fibrillation: Principal | ICD-10-CM | POA: Diagnosis present

## 2023-12-30 DIAGNOSIS — I10 Essential (primary) hypertension: Secondary | ICD-10-CM | POA: Diagnosis not present

## 2023-12-30 DIAGNOSIS — R55 Syncope and collapse: Secondary | ICD-10-CM | POA: Diagnosis not present

## 2023-12-30 DIAGNOSIS — R079 Chest pain, unspecified: Secondary | ICD-10-CM

## 2023-12-30 DIAGNOSIS — R0609 Other forms of dyspnea: Secondary | ICD-10-CM | POA: Diagnosis present

## 2023-12-30 DIAGNOSIS — I480 Paroxysmal atrial fibrillation: Secondary | ICD-10-CM

## 2023-12-30 DIAGNOSIS — R0789 Other chest pain: Secondary | ICD-10-CM | POA: Diagnosis not present

## 2023-12-30 DIAGNOSIS — I350 Nonrheumatic aortic (valve) stenosis: Secondary | ICD-10-CM | POA: Insufficient documentation

## 2023-12-30 NOTE — ED Triage Notes (Addendum)
 Pt arrives via [facility] from home d/t complaints of substernal chest pain followed by episode of tachycardia. Pt roommate reported to EMS that pt got up to use the bathroom then experienced a syncopal episode. Pt denies hitting head but roommate reported that she did. No injury noted. EMS reports upon hooking pt up to their monitor that her HR was in the 160s - 170s and in a-fib. Pt took 3 SL NTG tablets at home. EMS administered 325 mg ASA, 500 mL NS bolus, and 10 mg Diltiazem  bolus. Reports pt HR decreased to 82 after administration and fluctuated betweens 80s to 120s. Upon arrival here and unloading pt from back of truck EMS reports pt started to vomit and her HR increased back up to the 160s. Pt currently 126 bpm in triage. Denies chest pain at this time but reports previous episode was non-radiating and substernal. Reported hx of A-fib RVR

## 2023-12-30 NOTE — ED Provider Notes (Signed)
 [facility] Provider Note   Event Date/Time   First MD Initiated Contact with Patient 12/30/23 2356     (approximate) History  Chest Pain, A-fib RVR , Emesis, and Loss of Consciousness  HPI Hannah Hooper is a 89 y.o. female with a past medical history of atrial fibrillation on Eliquis , hyperlipidemia, and aortic stenosis who presents complaining of substernal chest pain followed by episode of tachycardia and presyncope.  Patient's roommate told EMS that she got up to go to the bathroom and had a full syncopal episode.  Patient denies for losing consciousness, hitting her head, or any subsequent episodes of loss of consciousness.  EMS noted patient's heart rate to be in the 160s-170s in atrial fibrillation upon their arrival.  Patient received 3 sublingual nitroglycerin , 325 of aspirin , 500 cc NS bolus, 10 mg of diltiazem  with initial improvement in patient's heart rate to the 80s however upon arrival patient's heart rate is back in the 160s. ROS: Patient currently denies any vision changes, tinnitus, difficulty speaking, facial droop, sore throat, shortness of breath, abdominal pain, nausea/vomiting/diarrhea, dysuria, or weakness/numbness/paresthesias in any extremity   Physical Exam  Triage Vital Signs: ED Triage Vitals  Encounter Vitals Group     BP      Girls Systolic BP Percentile      Girls Diastolic BP Percentile      Boys Systolic BP Percentile      Boys Diastolic BP Percentile      Pulse      Resp      Temp      Temp src      SpO2      Weight      Height      Head Circumference      Peak Flow      Pain Score      Pain Loc      Pain Education      Exclude from Growth Chart    Most recent vital signs: Vitals:   12/31/23 0415 12/31/23 0430  BP: (!) 113/91 112/75  Pulse: (!) 161 (!) 151  Resp: 19 20  Temp:    SpO2: 97% 96%   General: Awake, oriented x4. CV:  Good peripheral perfusion. Resp:  Normal effort. Abd:  No  distention. Other:  Elderly well-developed, well-nourished Caucasian female resting comfortably in no acute distress ED Results / Procedures / Treatments  Labs (all labs ordered are listed, but only abnormal results are displayed) Labs Reviewed  COMPREHENSIVE METABOLIC PANEL WITH GFR - Abnormal; Notable for the following components:      Result Value   Glucose, Bld 133 (*)    BUN 28 (*)    Creatinine, Ser 1.04 (*)    Calcium  8.6 (*)    Total Protein 6.3 (*)    GFR, Estimated 51 (*)    All other components within normal limits  BRAIN NATRIURETIC PEPTIDE - Abnormal; Notable for the following components:   B Natriuretic Peptide 533.2 (*)    All other components within normal limits  TROPONIN I (HIGH SENSITIVITY) - Abnormal; Notable for the following components:   Troponin I (High Sensitivity) 64 (*)    All other components within normal limits  CBC WITH DIFFERENTIAL/PLATELET  TROPONIN I (HIGH SENSITIVITY)   EKG ED ECG REPORT I, Charleen Conn, the attending physician, personally viewed and interpreted this ECG. Date: 12/30/2023 EKG Time: 2353 Rate: 120 Rhythm: Tachycardic sinus rhythm QRS Axis: normal Intervals: normal ST/T Wave abnormalities: normal  Narrative Interpretation: Tachycardic sinus rhythm.  No evidence of acute ischemia RADIOLOGY ED MD interpretation: One-view portable chest x-ray interpreted by me shows no evidence of acute abnormalities including no pneumonia, pneumothorax, or widened mediastinum - All radiology independently interpreted and agree with radiology assessment Official radiology report(s): DG Chest Port 1 View Result Date: 12/31/2023 EXAM: 1 VIEW XRAY OF THE CHEST 12/31/2023 12:09:00 AM COMPARISON: CT chest dated 04/22/2023. CLINICAL HISTORY: Chest pain. Pt arrives via [facility] from home d/t complaints of substernal chest pain followed by episode of tachycardia. FINDINGS: LUNGS AND PLEURA: No focal pulmonary opacity. No pulmonary edema. No pleural  effusion. No pneumothorax. HEART AND MEDIASTINUM: No acute abnormality of the cardiac and mediastinal silhouettes. BONES AND SOFT TISSUES: No acute osseous abnormality. IMPRESSION: 1. No acute process. Electronically signed by: Zadie Herter MD 12/31/2023 12:13 AM EDT RP Workstation: [id number]   PROCEDURES: Critical Care performed: Yes, see critical care procedure note(s) .1-3 Lead EKG Interpretation  Performed by: Charleen Conn, MD Authorized by: Charleen Conn, MD     Interpretation: abnormal     ECG rate:  121   ECG rate assessment: tachycardic     Rhythm: atrial fibrillation     Ectopy: none     Conduction: normal   CRITICAL CARE Performed by: Orlandria Kissner K Quinton Voth  Total critical care time: 41 minutes  Critical care time was exclusive of separately billable procedures and treating other patients.  Critical care was necessary to treat or prevent imminent or life-threatening deterioration.  Critical care was time spent personally by me on the following activities: development of treatment plan with patient and/or surrogate as well as nursing, discussions with consultants, evaluation of patient's response to treatment, examination of patient, obtaining history from patient or surrogate, ordering and performing treatments and interventions, ordering and review of laboratory studies, ordering and review of radiographic studies, pulse oximetry and re-evaluation of patient's condition.  MEDICATIONS ORDERED IN ED: Medications  diltiazem  (CARDIZEM ) 125 mg in dextrose  5% 125 mL (1 mg/mL) infusion (5 mg/hr Intravenous New Bag/Given 12/31/23 0348)  apixaban  (ELIQUIS ) tablet 2.5 mg (has no administration in time range)  acetaminophen  (TYLENOL ) tablet 650 mg (has no administration in time range)  ondansetron  (ZOFRAN ) injection 4 mg (has no administration in time range)  metoprolol  tartrate (LOPRESSOR ) injection 5 mg (5 mg Intravenous Given 12/31/23 0107)  metoprolol  tartrate (LOPRESSOR ) tablet  25 mg (25 mg Oral Given 12/31/23 0220)   IMPRESSION / MDM / ASSESSMENT AND PLAN / ED COURSE  I reviewed the triage vital signs and the nursing notes.                             The patient is on the cardiac monitor to evaluate for evidence of arrhythmia and/or significant heart rate changes. Patient's presentation is most consistent with acute presentation with potential threat to life or bodily function. + atrial fibrillation w/ RVR DDx: Pneumothorax, Pneumonia, Pulmonary Embolus, Tamponade, ACS, Thyrotoxicosis.  No history or evidence decompensated heart failure. Given their history and exam it is likely this patient is unlikely to spontaneously revert to a rate controlled rhythm and necessitates a thorough workup for their arrhythmia. Workup: ECG, CXR, CBC, BMP, UA, Troponin, BNP, TSH, Ca-Mag-Phos Interventions: Defer Cardioversion (uncertain historical reliability with time of onset, increased risk of thromboembolic stroke).  Start diltiazem  bolus and drip   Disposition: Admit   FINAL CLINICAL IMPRESSION(S) / ED DIAGNOSES   Final diagnoses:  None  Rx / DC Orders   ED Discharge Orders          Ordered    Amb referral to AFIB Clinic        12/31/23 0533           Note:  This document was prepared using Dragon voice recognition software and may include unintentional dictation errors.   Kathleena Freeman K, MD 12/31/23 [phone]

## 2023-12-31 ENCOUNTER — Emergency Department: Payer: PRIVATE HEALTH INSURANCE

## 2023-12-31 DIAGNOSIS — I4891 Unspecified atrial fibrillation: Secondary | ICD-10-CM | POA: Diagnosis present

## 2023-12-31 LAB — COMPREHENSIVE METABOLIC PANEL WITH GFR
ALT: 20 U/L (ref 0–44)
AST: 27 U/L (ref 15–41)
Albumin: 3.5 g/dL (ref 3.5–5.0)
Alkaline Phosphatase: 63 U/L (ref 38–126)
Anion gap: 12 (ref 5–15)
BUN: 28 mg/dL — ABNORMAL HIGH (ref 8–23)
CO2: 22 mmol/L (ref 22–32)
Calcium: 8.6 mg/dL — ABNORMAL LOW (ref 8.9–10.3)
Chloride: 107 mmol/L (ref 98–111)
Creatinine, Ser: 1.04 mg/dL — ABNORMAL HIGH (ref 0.44–1.00)
GFR, Estimated: 51 mL/min — ABNORMAL LOW (ref >60)
Glucose, Bld: 133 mg/dL — ABNORMAL HIGH (ref 70–99)
Potassium: 3.7 mmol/L (ref 3.5–5.1)
Sodium: 141 mmol/L (ref 135–145)
Total Bilirubin: 0.6 mg/dL (ref 0.0–1.2)
Total Protein: 6.3 g/dL — ABNORMAL LOW (ref 6.5–8.1)

## 2023-12-31 LAB — CBC WITH DIFFERENTIAL/PLATELET
Abs Immature Granulocytes: 0.02 10*3/uL (ref 0.00–0.07)
Basophils Absolute: 0.1 10*3/uL (ref 0.0–0.1)
Basophils Relative: 1 %
Eosinophils Absolute: 0 10*3/uL (ref 0.0–0.5)
Eosinophils Relative: 0 %
HCT: 41 % (ref 36.0–46.0)
Hemoglobin: 12.7 g/dL (ref 12.0–15.0)
Immature Granulocytes: 0 %
Lymphocytes Relative: 28 %
Lymphs Abs: 2.2 10*3/uL (ref 0.7–4.0)
MCH: 27.3 pg (ref 26.0–34.0)
MCHC: 31 g/dL (ref 30.0–36.0)
MCV: 88.2 fL (ref 80.0–100.0)
Monocytes Absolute: 0.9 10*3/uL (ref 0.1–1.0)
Monocytes Relative: 11 %
Neutro Abs: 4.7 10*3/uL (ref 1.7–7.7)
Neutrophils Relative %: 60 %
Platelets: 212 10*3/uL (ref 150–400)
RBC: 4.65 MIL/uL (ref 3.87–5.11)
RDW: 14.5 % (ref 11.5–15.5)
WBC: 7.8 10*3/uL (ref 4.0–10.5)
nRBC: 0 % (ref 0.0–0.2)

## 2023-12-31 LAB — TROPONIN I (HIGH SENSITIVITY)
Troponin I (High Sensitivity): 16 ng/L (ref <18)
Troponin I (High Sensitivity): 64 ng/L — ABNORMAL HIGH (ref <18)

## 2023-12-31 LAB — MAGNESIUM: Magnesium: 2.1 mg/dL (ref 1.7–2.4)

## 2023-12-31 LAB — PHOSPHORUS: Phosphorus: 3.4 mg/dL (ref 2.5–4.6)

## 2023-12-31 LAB — BRAIN NATRIURETIC PEPTIDE: B Natriuretic Peptide: 533.2 pg/mL — ABNORMAL HIGH (ref 0.0–100.0)

## 2023-12-31 MED ORDER — DILTIAZEM HCL-DEXTROSE 125-5 MG/125ML-% IV SOLN (PREMIX)
5.0000 mg/h | INTRAVENOUS | Status: DC
  Administered 2023-12-31: 5 mg/h via INTRAVENOUS
  Filled 2023-12-31: qty 125

## 2023-12-31 MED ORDER — ACETAMINOPHEN 325 MG PO TABS: 650.0000 mg | ORAL_TABLET | ORAL | Status: DC | PRN

## 2023-12-31 MED ORDER — POTASSIUM CHLORIDE CRYS ER 20 MEQ PO TBCR
40.0000 meq | EXTENDED_RELEASE_TABLET | Freq: Once | ORAL | Status: AC
  Administered 2023-12-31: 40 meq via ORAL
  Filled 2023-12-31: qty 2

## 2023-12-31 MED ORDER — APIXABAN 2.5 MG PO TABS
2.5000 mg | ORAL_TABLET | Freq: Two times a day (BID) | ORAL | Status: DC
  Administered 2023-12-31 – 2024-01-01 (×3): 2.5 mg via ORAL
  Filled 2023-12-31 (×4): qty 1

## 2023-12-31 MED ORDER — AMIODARONE HCL 200 MG PO TABS
200.0000 mg | ORAL_TABLET | Freq: Two times a day (BID) | ORAL | Status: DC
  Administered 2023-12-31 – 2024-01-01 (×3): 200 mg via ORAL
  Filled 2023-12-31 (×3): qty 1

## 2023-12-31 MED ORDER — METOPROLOL TARTRATE 5 MG/5ML IV SOLN
5.0000 mg | Freq: Once | INTRAVENOUS | Status: AC
  Administered 2023-12-31: 5 mg via INTRAVENOUS
  Filled 2023-12-31: qty 5

## 2023-12-31 MED ORDER — METOPROLOL TARTRATE 25 MG PO TABS
25.0000 mg | ORAL_TABLET | Freq: Two times a day (BID) | ORAL | Status: DC
  Administered 2023-12-31: 25 mg via ORAL
  Filled 2023-12-31: qty 1

## 2023-12-31 MED ORDER — METOPROLOL TARTRATE 5 MG/5ML IV SOLN: 2.5000 mg | INTRAVENOUS | Status: DC | PRN

## 2023-12-31 MED ORDER — ONDANSETRON HCL 4 MG/2ML IJ SOLN: 4.0000 mg | Freq: Four times a day (QID) | INTRAMUSCULAR | Status: DC | PRN

## 2023-12-31 MED ORDER — ASPIRIN 81 MG PO CHEW
81.0000 mg | CHEWABLE_TABLET | Freq: Every day | ORAL | Status: DC
  Administered 2023-12-31 – 2024-01-01 (×2): 81 mg via ORAL
  Filled 2023-12-31 (×2): qty 1

## 2023-12-31 MED ORDER — ATORVASTATIN CALCIUM 20 MG PO TABS
40.0000 mg | ORAL_TABLET | Freq: Every day | ORAL | Status: DC
  Administered 2023-12-31 – 2024-01-01 (×2): 40 mg via ORAL
  Filled 2023-12-31 (×2): qty 2

## 2023-12-31 MED ORDER — METOPROLOL TARTRATE 25 MG PO TABS
25.0000 mg | ORAL_TABLET | Freq: Once | ORAL | Status: AC
  Administered 2023-12-31: 25 mg via ORAL
  Filled 2023-12-31: qty 1

## 2023-12-31 NOTE — H&P (Addendum)
 History and Physical    Hannah Hooper:[id number] DOB: 1931/09/29 DOA: 12/30/2023  PCP: Jimmy Moulding, MD (Confirm with patient/family/NH records and if not entered, this has to be entered at [facility] point of entry) Patient coming from: Home  I have personally briefly reviewed patient's old medical records in [facility]  Chief Complaint: Chest pain, palpitations, lightheaded  HPI: Hannah Hooper is a 89 y.o. female with medical history significant of PAF on Eliquis , HTN, HLD, aortic stenosis, presented with palpitations chest pain and near syncope.  Symptoms started yesterday evening, patient was outside sitting on the bench and started to have severe palpitations and lightheadedness, denied any LOC no fall.  EMS arrived and found patient heart rate in the 160-170s and in A-fib and patient was given nitroglycerin  aspirin  and 500 saline bolus and 10 mg of Cardizem .  Heart rate improved to 80s and chest pain subsided.  ED Course: On Cardizem  drip heart rate ranging from 90s-110s, blood pressure 125/66 O2 saturation 100% on room air.  Chest x-ray showed negative acute infiltrates.  Blood work showed BUN 28 creatinine 1.0 WBC 7.8.  K3.7.  Magnesium  2.1 phosphorus 3.4.  Review of Systems: As per HPI otherwise 14 point review of systems negative.    Past Medical History:  Diagnosis Date   Benign neoplasm of colon, unspecified    Carotid artery disease (HCC)    Diverticulosis    Hyperlipemia    Hypertension    Hypertensive kidney disease with CKD stage III (HCC)    Lesion of ulnar nerve    Osteoarthritis    Valvular heart disease    a. 02/2020 Echo: EF 50%, triv AI, mild AS/MR/MS/TR.    Past Surgical History:  Procedure Laterality Date   bunions  1990   CARPOMETACARPAL [facility]) FUSION OF THUMB Left 06/24/2016   Procedure: CARPOMETACARPAL [facility]) ARthroplasty OF THUMB;  Surgeon: Molli Angelucci, MD;  Location: [facility] ORS;  Service: Orthopedics;  Laterality: Left;   CATARACT EXTRACTION  Bilateral    EYE SURGERY Bilateral 2014   cataract surgery   GAS/FLUID EXCHANGE Left 03/02/2018   Procedure: GAS/FLUID EXCHANGE;  Surgeon: Ronelle Coffee, MD;  Location: [facility] OR;  Service: Ophthalmology;  Laterality: Left;   MEMBRANE PEEL Left 03/02/2018   Procedure: MEMBRANE PEEL;  Surgeon: Ronelle Coffee, MD;  Location: [facility] OR;  Service: Ophthalmology;  Laterality: Left;   PARS PLANA VITRECTOMY Left 03/02/2018   Procedure: PARS PLANA VITRECTOMY WITH 25 GAUGE;  Surgeon: Ronelle Coffee, MD;  Location: [facility] OR;  Service: Ophthalmology;  Laterality: Left;   RIGHT/LEFT HEART CATH AND CORONARY ANGIOGRAPHY N/A 04/11/2023   Procedure: RIGHT/LEFT HEART CATH AND CORONARY ANGIOGRAPHY;  Surgeon: Wenona Hamilton, MD;  Location: [facility] INVASIVE CV LAB;  Service: Cardiovascular;  Laterality: N/A;   SHOULDER ARTHROSCOPY Right 2014   TONSILLECTOMY     as a child   ULNAR NERVE TRANSPOSITION Right      reports that she has never smoked. She has never used smokeless tobacco. She reports that she does not currently use alcohol. She reports that she does not use drugs.  No Known Allergies  Family History  Problem Relation Age of Onset   Breast cancer Mother 53     Prior to Admission medications   Medication Sig Start Date End Date Taking? Authorizing Provider  amLODipine  (NORVASC ) 5 MG tablet Take 1 tablet (5 mg total) by mouth daily. 04/12/23   Aisha Hove, MD  apixaban  (ELIQUIS ) 2.5 MG TABS tablet Take 1 tablet (2.5  mg total) by mouth 2 (two) times daily. 04/12/23   Aisha Hove, MD  aspirin  81 MG chewable tablet Chew 1 tablet (81 mg total) by mouth daily. 04/13/23   Aisha Hove, MD  atorvastatin  (LIPITOR ) 40 MG tablet Take 1 tablet (40 mg total) by mouth daily. 04/13/23   Aisha Hove, MD  cyanocobalamin (VITAMIN B12) 1000 MCG tablet Take 1 tablet (1,000 mcg total) by mouth daily. 04/12/23   Aisha Hove, MD  metoprolol  tartrate (LOPRESSOR ) 25 MG tablet Take 1 tablet  (25 mg total) by mouth 2 (two) times daily. 05/13/23   Morey Ar, NP  nitroGLYCERIN  (NITROSTAT ) 0.4 MG SL tablet PLACE ONE TABLET UNDER TONGUE AS NEEDED FOR CHEST PAIN EVERY 5 MINUTES 07/25/23   Wittenborn, Bernardo Bridgeman, NP  sulfaSALAzine  (AZULFIDINE ) 500 MG tablet Take 2 tablets (1,000 mg total) by mouth daily. 04/12/23   Aisha Hove, MD    Physical Exam: Vitals:   12/31/23 0700 12/31/23 0715 12/31/23 0757 12/31/23 0800  BP: (!) 100/56 104/72 (!) 124/98 125/66  Pulse: (!) 119   84  Resp: 20 (!) 22 18 20   Temp:      TempSrc:      SpO2: 100% 99%  100%  Weight:      Height:        Constitutional: NAD, calm, comfortable Vitals:   12/31/23 0700 12/31/23 0715 12/31/23 0757 12/31/23 0800  BP: (!) 100/56 104/72 (!) 124/98 125/66  Pulse: (!) 119   84  Resp: 20 (!) 22 18 20   Temp:      TempSrc:      SpO2: 100% 99%  100%  Weight:      Height:       Eyes: PERRL, lids and conjunctivae normal ENMT: Mucous membranes are moist. Posterior pharynx clear of any exudate or lesions.Normal dentition.  Neck: normal, supple, no masses, no thyromegaly Respiratory: clear to auscultation bilaterally, no wheezing, no crackles. Normal respiratory effort. No accessory muscle use.  Cardiovascular: Irregular heart rate, systolic murmur on heart base no extremity edema. 2+ pedal pulses. No carotid bruits.  Abdomen: no tenderness, no masses palpated. No hepatosplenomegaly. Bowel sounds positive.  Musculoskeletal: no clubbing / cyanosis. No joint deformity upper and lower extremities. Good ROM, no contractures. Normal muscle tone.  Skin: no rashes, lesions, ulcers. No induration Neurologic: CN 2-12 grossly intact. Sensation intact, DTR normal. Strength 5/5 in all 4.  Psychiatric: Normal judgment and insight. Alert and oriented x 3. Normal mood.     Labs on Admission: I have personally reviewed following labs and imaging studies  CBC: Recent Labs  Lab 12/31/23 0004  WBC 7.8  NEUTROABS 4.7   HGB 12.7  HCT 41.0  MCV 88.2  PLT 212   Basic Metabolic Panel: Recent Labs  Lab 12/31/23 0004 12/31/23 0225  NA 141  --   K 3.7  --   CL 107  --   CO2 22  --   GLUCOSE 133*  --   BUN 28*  --   CREATININE 1.04*  --   CALCIUM  8.6*  --   MG  --  2.1  PHOS  --  3.4   GFR: Estimated Creatinine Clearance: 28 mL/min (A) (by C-G formula based on SCr of 1.04 mg/dL (H)). Liver Function Tests: Recent Labs  Lab 12/31/23 0004  AST 27  ALT 20  ALKPHOS 63  BILITOT 0.6  PROT 6.3*  ALBUMIN 3.5   No results for input(s): LIPASE, AMYLASE in the last 168 hours. No results for input(s):  AMMONIA in the last 168 hours. Coagulation Profile: No results for input(s): INR, PROTIME in the last 168 hours. Cardiac Enzymes: No results for input(s): CKTOTAL, CKMB, CKMBINDEX, TROPONINI in the last 168 hours. BNP (last 3 results) No results for input(s): PROBNP in the last 8760 hours. HbA1C: No results for input(s): HGBA1C in the last 72 hours. CBG: No results for input(s): GLUCAP in the last 168 hours. Lipid Profile: No results for input(s): CHOL, HDL, LDLCALC, TRIG, CHOLHDL, LDLDIRECT in the last 72 hours. Thyroid Function Tests: No results for input(s): TSH, T4TOTAL, FREET4, T3FREE, THYROIDAB in the last 72 hours. Anemia Panel: No results for input(s): VITAMINB12, FOLATE, FERRITIN, TIBC, IRON, RETICCTPCT in the last 72 hours. Urine analysis: No results found for: COLORURINE, APPEARANCEUR, LABSPEC, PHURINE, GLUCOSEU, HGBUR, BILIRUBINUR, KETONESUR, PROTEINUR, UROBILINOGEN, NITRITE, LEUKOCYTESUR  Radiological Exams on Admission: DG Chest Port 1 View Result Date: 12/31/2023 EXAM: 1 VIEW XRAY OF THE CHEST 12/31/2023 12:09:00 AM COMPARISON: CT chest dated 04/22/2023. CLINICAL HISTORY: Chest pain. Pt arrives via [facility] from home d/t complaints of substernal chest pain followed by episode of tachycardia. FINDINGS:  LUNGS AND PLEURA: No focal pulmonary opacity. No pulmonary edema. No pleural effusion. No pneumothorax. HEART AND MEDIASTINUM: No acute abnormality of the cardiac and mediastinal silhouettes. BONES AND SOFT TISSUES: No acute osseous abnormality. IMPRESSION: 1. No acute process. Electronically signed by: Zadie Herter MD 12/31/2023 12:13 AM EDT RP Workstation: [id number]    EKG: Independently reviewed.  A-fib with RVR, no acute ST changes.  Assessment/Plan Principal Problem:   Atrial fibrillation with rapid ventricular response (HCC) Active Problems:   Afib (HCC)  (please populate well all problems here in Problem List. (For example, if patient is on BP meds at home and you resume or decide to hold them, it is a problem that needs to be her. Same for CAD, COPD, HLD and so on)  A-fib with RVR - Discussed with cardiology, given that the patient has underlying severe aortic stenosis, cardiology recommended both rate control and rhythm control to prevent future breakthrough A-fib RVR.  Cardiology recommend amiodarone 200 mg twice daily for 2 weeks for loading then switch to 200 mg daily.  Will discontinue Cardizem  drip and start amiodarone p.o. loading.  And as needed Lopressor  for breakthrough heart rate. - Continue Eliquis   Chest pain Near syncope Severe aortic stenosis -Clinically suspect decompensated severe aortic stenosis, probably secondary to rapid A-fib.  Management as above -Review of patient history show patient underwent extensive aortic stenosis workup including echocardiogram and CTA October 2024.  Results showed severe aortic stenosis.  Cardiology at that time reviewed the case and recommended patient not a candidate for TAVR and recommended medical management.  Currently and clinically, patient is volume neutral and expect her symptoms will improve after heart rate control.   - Long-term prognosis is poor given unamenable severe aortic stenosis.  Will consult palliative care. -  Troponin negative x 2, ACS ruled out.    DVT prophylaxis: Eliquis  Code Status: Full code Family Communication: None at bedside Disposition Plan: Expect less than 2 midnight hospital stay Consults called: None Admission status: PCU observation   [place] MD [facility] Pager [phone]  12/31/2023, 9:51 AM

## 2023-12-31 NOTE — Plan of Care (Signed)

## 2023-12-31 NOTE — Progress Notes (Signed)
 Dr. Jeane Miguel instructed RN to stop Cardizem  drip since heart rate is now in low 60s. RN had been titrating drip down.

## 2024-01-01 DIAGNOSIS — R079 Chest pain, unspecified: Secondary | ICD-10-CM | POA: Diagnosis not present

## 2024-01-01 DIAGNOSIS — Z515 Encounter for palliative care: Secondary | ICD-10-CM | POA: Diagnosis not present

## 2024-01-01 DIAGNOSIS — Z7189 Other specified counseling: Secondary | ICD-10-CM | POA: Diagnosis not present

## 2024-01-01 DIAGNOSIS — I480 Paroxysmal atrial fibrillation: Secondary | ICD-10-CM | POA: Diagnosis not present

## 2024-01-01 DIAGNOSIS — I350 Nonrheumatic aortic (valve) stenosis: Secondary | ICD-10-CM | POA: Diagnosis not present

## 2024-01-01 DIAGNOSIS — I4891 Unspecified atrial fibrillation: Secondary | ICD-10-CM | POA: Diagnosis not present

## 2024-01-01 LAB — BASIC METABOLIC PANEL WITH GFR
Anion gap: 6 (ref 5–15)
BUN: 22 mg/dL (ref 8–23)
CO2: 25 mmol/L (ref 22–32)
Calcium: 8.4 mg/dL — ABNORMAL LOW (ref 8.9–10.3)
Chloride: 108 mmol/L (ref 98–111)
Creatinine, Ser: 1.02 mg/dL — ABNORMAL HIGH (ref 0.44–1.00)
GFR, Estimated: 52 mL/min — ABNORMAL LOW (ref >60)
Glucose, Bld: 102 mg/dL — ABNORMAL HIGH (ref 70–99)
Potassium: 4.5 mmol/L (ref 3.5–5.1)
Sodium: 139 mmol/L (ref 135–145)

## 2024-01-01 MED ORDER — AMIODARONE HCL 200 MG PO TABS: 200.0000 mg | ORAL_TABLET | Freq: Two times a day (BID) | ORAL | 0 refills | Status: DC

## 2024-01-01 NOTE — Plan of Care (Signed)
  Problem: Clinical Measurements: Goal: Ability to maintain clinical measurements within normal limits will improve Outcome: Progressing Goal: Will remain free from infection Outcome: Progressing Goal: Diagnostic test results will improve Outcome: Progressing Goal: Respiratory complications will improve Outcome: Progressing Goal: Cardiovascular complication will be avoided Outcome: Progressing   Problem: Activity: Goal: Risk for activity intolerance will decrease Outcome: Progressing   Problem: Nutrition: Goal: Adequate nutrition will be maintained Outcome: Progressing   Problem: Coping: Goal: Level of anxiety will decrease Outcome: Progressing   Problem: Elimination: Goal: Will not experience complications related to bowel motility Outcome: Progressing Goal: Will not experience complications related to urinary retention Outcome: Progressing   Problem: Pain Managment: Goal: General experience of comfort will improve and/or be controlled Outcome: Progressing   Problem: Skin Integrity: Goal: Risk for impaired skin integrity will decrease Outcome: Progressing   Problem: Education: Goal: Knowledge of disease or condition will improve Outcome: Progressing Goal: Understanding of medication regimen will improve Outcome: Progressing   Problem: Cardiac: Goal: Ability to achieve and maintain adequate cardiopulmonary perfusion will improve Outcome: Progressing

## 2024-01-01 NOTE — Hospital Course (Addendum)
 Taken from H&P.   Hannah Hooper is a 89 y.o. female with medical history significant of PAF on Eliquis , HTN, HLD, aortic stenosis, presented with palpitations chest pain and near syncope.   EMS found patient in A-fib with RVR, which improved with 10 mg of Cardizem .  On arrival to ED patient went back in RVR and started on Cardizem  drip.  Otherwise stable vitals.  Chest x-ray with no acute abnormalities.  Labs seems stable.  Patient was recently evaluated for TAVR due to severe AS but found to be not a surgical candidate.  Case was discussed with cardiology due to underlying severe aortic stenosis and they recommend rate control with amiodarone 200 mg twice daily for 2 weeks followed by 200 mg daily and Cardizem  drip was discontinued.  Palliative care was also consulted due to poor long-term prognosis with severe AS.  6/15: Vitals and labs stable.  Troponin at 64 likely due to demand ischemia. Converted back to sinus rhythm.  Patient appears to be at baseline with no chest pain or shortness of breath.  Patient has baseline exertional dyspnea due to severe aortic stenosis.  Her home metoprolol  was switched with amiodarone, she will take 200 mg twice daily for 2 weeks followed by once a day.  She we will continue the rest of her home medications and follow-up with her providers closely for further assistance.  Outpatient referral for palliative care was also provided due to poor long-term prognosis with severe AS.

## 2024-01-01 NOTE — Consult Note (Addendum)
 Consultation Note Date: 01/01/2024 at 1100  Patient Name: Hannah Hooper  DOB: 11-01-1931  MRN: [id number]  Age / Sex: 89 y.o., female  PCP: Jimmy Moulding, MD Referring Physician: Luna Salinas, MD  HPI/Patient Profile: 89 y.o. female  with past medical history significant for PAF on Eliquis , HTN, HLD, severe aortic stenosis.  Pt presented to ED from home 12/30/2023 complaining of palpitations, chest pain and near syncope.  Patient reported chest pain and palpitations started previous day accompanied by lightheadedness. Upon EMS arrival patient's heart rate was 160s-170s in A-fib.  She was given nitroglycerin , ASA and NS bolus as well as 10 mg of Cardizem .  Heart rate subsequently improved to 80s and chest pain subsided.  ED workup showed BUN 28, creatinine 1.0, GFR 52, troponin 64, BNP 533.2 WBC 7.8, K+ 3.7, mag 2.1, phosphorus 3.4.  CXR was negative for acute process.  BP 100/56, HR 119, RR 20, SpO2 100% RA.   Patient was admitted to TRH service for management of A-fib with RVR, chest pain, severe aortic stenosis and near syncopal episode.  EMT was consulted to assist with GOC conversations.  Clinical Assessment and Goals of Care: Extensive chart review completed prior to meeting patient including labs, vital signs, imaging, progress notes, orders, and available advanced directive documents from current and previous encounters. I then met with patient to discuss diagnosis prognosis, GOC, EOL wishes, disposition and options.  I introduced Palliative Medicine as specialized medical care for people living with serious illness. It focuses on providing relief from the symptoms and stress of a serious illness. The goal is to improve quality of life for both the patient and the family.  Elderly female sitting upright in bed.  She is alert and oriented x 4, calm and pleasant.  She is in no distress  We discussed a  brief life review of the patient.  Patient shares that she is originally from [place]  where her husband worked.  She and her husband were married for over 37 years.  They had 2 children, Hannah Hooper and Hannah Hooper.  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  She has lived in [place]  approximately 15 years and resides with her son Hannah Hooper and one of her friends.  As far as functional and nutritional status patient shares she is completely independent performing all of her ADLs on her own to include cooking, cleaning and driving.  She does all of her own grocery shopping and attends doctors appointments independently.  She reports eating well with a very good appetite, at times eating too much.  We discussed patient's current illness and what it means in the larger context of patient's on-going co-morbidities.  Natural disease trajectory and expectations at EOL were discussed.  I attempted to elicit values and goals of care important to the patient.  Patient's main goal is to go home and see her cat.  He shares that she is supposed to be discharged later today.  The difference between aggressive medical intervention and  comfort care was considered in light of the patient's goals of care.   Advance directives, concepts specific to code status, artificial feeding and hydration, and rehospitalization were considered and discussed.  Patient remains a full code.     Discussed with patient/family the importance of continued conversation with family and the medical providers regarding overall plan of care and treatment options, ensuring decisions are within the context of the patient's values and GOCs.  Ms. Conrow shares that Hannah Hooper (son) is her appointed first surrogate decision maker should she be unable to make decisions for herself.  Praise Stennett (son) is secondary Runner, broadcasting/film/video.  Hospice and Palliative Care services outpatient were explained and  offered. Dr. Ariel Begun has discussed outpatient palliative services with patient and is receptive to this idea.  Consult placed to [facility] for hospice liaison to refer for outpatient palliative services.  Questions and concerns were addressed. The family was encouraged to call with questions or concerns.   Primary Decision Maker PATIENT  Physical Exam Vitals reviewed.  Constitutional:      General: She is not in acute distress.    Appearance: She is not ill-appearing.  HENT:     Head: Normocephalic and atraumatic.     Mouth/Throat:     Mouth: Mucous membranes are moist.  Pulmonary:     Effort: Pulmonary effort is normal. No respiratory distress.   Musculoskeletal:     Right lower leg: No edema.     Left lower leg: No edema.   Skin:    General: Skin is warm and dry.   Neurological:     Mental Status: She is alert and oriented to person, place, and time.   Psychiatric:        Mood and Affect: Mood normal.        Behavior: Behavior normal.        Thought Content: Thought content normal.        Judgment: Judgment normal.   Recommendations/Plan: FULL CODE status as previously documented    Continue current supportive interventions Plan to d/c home when medically stable  Palliative Assessment/Data: 70%   Discussed plan of care with Dr. Ariel Begun [facility] and hospice liaison.  Thank you for this consult. Palliative medicine will continue to follow and assist holistically.   Time Total: 75 minutes  Time spent includes: Detailed review of medical records (labs, imaging, vital signs), medically appropriate exam (mental status, respiratory, cardiac, skin), discussed with treatment team, counseling and educating patient, family and staff, documenting clinical information, medication management and coordination of care.     Ina Manas, Joyice [facility] Palliative Medicine Team  01/01/2024 9:13 AM  Office [phone]  Pager [phone]     Please contact Palliative Medicine Team providers via  AMION for questions and concerns.

## 2024-01-01 NOTE — Discharge Summary (Signed)
 Physician Discharge Summary   Patient: Hannah Hooper MRN: [id number] DOB: 04-Jan-1932  Admit date:     12/30/2023  Discharge date: 01/01/24  Discharge Physician: Luna Salinas   PCP: Jimmy Moulding, MD   Recommendations at discharge:  Please obtain CBC, BMP and TSH on follow-up Patient was started on amiodarone and metoprolol  was discontinued after discussing with cardiology Follow-up with cardiology Follow-up with outpatient palliative care and primary care provider  Discharge Diagnoses: Principal Problem:   Atrial fibrillation with rapid ventricular response (HCC) Active Problems:   Afib [facility])   Hospital Course: Taken from H&P.   Hannah Hooper is a 89 y.o. female with medical history significant of PAF on Eliquis , HTN, HLD, aortic stenosis, presented with palpitations chest pain and near syncope.   EMS found patient in A-fib with RVR, which improved with 10 mg of Cardizem .  On arrival to ED patient went back in RVR and started on Cardizem  drip.  Otherwise stable vitals.  Chest x-ray with no acute abnormalities.  Labs seems stable.  Patient was recently evaluated for TAVR due to severe AS but found to be not a surgical candidate.  Case was discussed with cardiology due to underlying severe aortic stenosis and they recommend rate control with amiodarone 200 mg twice daily for 2 weeks followed by 200 mg daily and Cardizem  drip was discontinued.  Palliative care was also consulted due to poor long-term prognosis with severe AS.  6/15: Vitals and labs stable.  Troponin at 64 likely due to demand ischemia. Converted back to sinus rhythm.  Patient appears to be at baseline with no chest pain or shortness of breath.  Patient has baseline exertional dyspnea due to severe aortic stenosis.  Her home metoprolol  was switched with amiodarone, she will take 200 mg twice daily for 2 weeks followed by once a day.  She we will continue the rest of her home medications and follow-up with her  providers closely for further assistance.  Outpatient referral for palliative care was also provided due to poor long-term prognosis with severe AS.   Consultants: Case was discussed with cardiology Procedures performed: None Disposition: Home Diet recommendation:  Discharge Diet Orders (From admission, onward)     Start     Ordered   01/01/24 0000  Diet - low sodium heart healthy        01/01/24 1036           Cardiac diet DISCHARGE MEDICATION: Allergies as of 01/01/2024   No Known Allergies      Medication List     STOP taking these medications    metoprolol  tartrate 25 MG tablet Commonly known as: LOPRESSOR        TAKE these medications    amiodarone 200 MG tablet Commonly known as: PACERONE Take 1 tablet (200 mg total) by mouth 2 (two) times daily. For 2 weeks and then start taking once a day   amLODipine  5 MG tablet Commonly known as: NORVASC  Take 1 tablet (5 mg total) by mouth daily.   apixaban  2.5 MG Tabs tablet Commonly known as: ELIQUIS  Take 1 tablet (2.5 mg total) by mouth 2 (two) times daily.   aspirin  81 MG chewable tablet Chew 1 tablet (81 mg total) by mouth daily.   atorvastatin  40 MG tablet Commonly known as: LIPITOR  Take 1 tablet (40 mg total) by mouth daily.   cyanocobalamin 1000 MCG tablet Commonly known as: VITAMIN B12 Take 1 tablet (1,000 mcg total) by mouth daily.   nitroGLYCERIN  0.4  MG SL tablet Commonly known as: NITROSTAT  PLACE ONE TABLET UNDER TONGUE AS NEEDED FOR CHEST PAIN EVERY 5 MINUTES   sulfaSALAzine  500 MG tablet Commonly known as: AZULFIDINE  Take 2 tablets (1,000 mg total) by mouth daily.        Follow-up Information     Jimmy Moulding, MD. Schedule an appointment as soon as possible for a visit in 1 week(s).   Specialty: Internal Medicine Contact information: [address] [facility] [place] [address] [phone]         Arnoldo Lapping, MD. Schedule an appointment as  soon as possible for a visit in 1 week(s).   Specialty: Cardiology Contact information: [address] [place] [address] [phone]                Discharge Exam: Cleavon Curls Weights   12/30/23 2358  Weight: 55.1 kg   General.  Frail elderly lady, in no acute distress. Pulmonary.  Lungs clear bilaterally, normal respiratory effort. CV.  Regular rate and rhythm, loud murmur Abdomen.  Soft, nontender, nondistended, BS positive. CNS.  Alert and oriented .  No focal neurologic deficit. Extremities.  No edema, no cyanosis, pulses intact and symmetrical. Psychiatry.  Judgment and insight appears normal.   Condition at discharge: stable  The results of significant diagnostics from this hospitalization (including imaging, microbiology, ancillary and laboratory) are listed below for reference.   Imaging Studies: DG Chest Port 1 View Result Date: 12/31/2023 EXAM: 1 VIEW XRAY OF THE CHEST 12/31/2023 12:09:00 AM COMPARISON: CT chest dated 04/22/2023. CLINICAL HISTORY: Chest pain. Pt arrives via [facility] from home d/t complaints of substernal chest pain followed by episode of tachycardia. FINDINGS: LUNGS AND PLEURA: No focal pulmonary opacity. No pulmonary edema. No pleural effusion. No pneumothorax. HEART AND MEDIASTINUM: No acute abnormality of the cardiac and mediastinal silhouettes. BONES AND SOFT TISSUES: No acute osseous abnormality. IMPRESSION: 1. No acute process. Electronically signed by: Zadie Herter MD 12/31/2023 12:13 AM EDT RP Workstation: [id number]    Microbiology: Results for orders placed or performed during the hospital encounter of 04/08/23  Resp panel by RT-PCR (RSV, Flu A&B, Covid) Anterior Nasal Swab     Status: None   Collection Time: 04/09/23 12:06 AM   Specimen: Anterior Nasal Swab  Result Value Ref Range Status   SARS Coronavirus 2 by RT PCR NEGATIVE NEGATIVE Final    Comment: (NOTE) SARS-CoV-2 target nucleic acids are NOT DETECTED.  The  SARS-CoV-2 RNA is generally detectable in upper respiratory specimens during the acute phase of infection. The lowest concentration of SARS-CoV-2 viral copies this assay can detect is 138 copies/mL. A negative result does not preclude SARS-Cov-2 infection and should not be used as the sole basis for treatment or other patient management decisions. A negative result may occur with  improper specimen collection/handling, submission of specimen other than nasopharyngeal swab, presence of viral mutation(s) within the areas targeted by this assay, and inadequate number of viral copies(<138 copies/mL). A negative result must be combined with clinical observations, patient history, and epidemiological information. The expected result is Negative.  Fact Sheet for Patients:  BloggerCourse.com  Fact Sheet for Healthcare Providers:  SeriousBroker.it  This test is no t yet approved or cleared by the [place]  FDA and  has been authorized for detection and/or diagnosis of SARS-CoV-2 by FDA under an Emergency Use Authorization (EUA). This EUA will remain  in effect (meaning this test can be used) for the duration of the  COVID-19 declaration under Section 564(b)(1) of the Act, 21 U.S.C.section 360bbb-3(b)(1), unless the authorization is terminated  or revoked sooner.       Influenza A by PCR NEGATIVE NEGATIVE Final   Influenza B by PCR NEGATIVE NEGATIVE Final    Comment: (NOTE) The Xpert Xpress SARS-CoV-2/FLU/RSV plus assay is intended as an aid in the diagnosis of influenza from Nasopharyngeal swab specimens and should not be used as a sole basis for treatment. Nasal washings and aspirates are unacceptable for Xpert Xpress SARS-CoV-2/FLU/RSV testing.  Fact Sheet for Patients: BloggerCourse.com  Fact Sheet for Healthcare Providers: SeriousBroker.it  This test is not yet approved or  cleared by the [place]  FDA and has been authorized for detection and/or diagnosis of SARS-CoV-2 by FDA under an Emergency Use Authorization (EUA). This EUA will remain in effect (meaning this test can be used) for the duration of the COVID-19 declaration under Section 564(b)(1) of the Act, 21 U.S.C. section 360bbb-3(b)(1), unless the authorization is terminated or revoked.     Resp Syncytial Virus by PCR NEGATIVE NEGATIVE Final    Comment: (NOTE) Fact Sheet for Patients: BloggerCourse.com  Fact Sheet for Healthcare Providers: SeriousBroker.it  This test is not yet approved or cleared by the [place]  FDA and has been authorized for detection and/or diagnosis of SARS-CoV-2 by FDA under an Emergency Use Authorization (EUA). This EUA will remain in effect (meaning this test can be used) for the duration of the COVID-19 declaration under Section 564(b)(1) of the Act, 21 U.S.C. section 360bbb-3(b)(1), unless the authorization is terminated or revoked.  Performed at [facility], [address] [place], [place] [address]     Labs: CBC: Recent Labs  Lab 12/31/23 0004  WBC 7.8  NEUTROABS 4.7  HGB 12.7  HCT 41.0  MCV 88.2  PLT 212   Basic Metabolic Panel: Recent Labs  Lab 12/31/23 0004 12/31/23 0225 01/01/24 0409  NA 141  --  139  K 3.7  --  4.5  CL 107  --  108  CO2 22  --  25  GLUCOSE 133*  --  102*  BUN 28*  --  22  CREATININE 1.04*  --  1.02*  CALCIUM  8.6*  --  8.4*  MG  --  2.1  --   PHOS  --  3.4  --    Liver Function Tests: Recent Labs  Lab 12/31/23 0004  AST 27  ALT 20  ALKPHOS 63  BILITOT 0.6  PROT 6.3*  ALBUMIN 3.5   CBG: No results for input(s): GLUCAP in the last 168 hours.  Discharge time spent: greater than 30 minutes.  This record has been created using Conservation officer, historic buildings. Errors have been sought and corrected,but may not always be located. Such  creation errors do not reflect on the standard of care.   Signed: Luna Salinas, MD [facility] 01/01/2024

## 2024-01-01 NOTE — Progress Notes (Signed)
 [facility] Palliative Referral Note  New referral received for outpatient palliative care at home received from [facility], T[facility].  Referral sent to [facility] referral intake.  Patient discharging home today.  Thank you for allowing participation in this patient's care.  Ambrosio Junker, MA, BSN, RN, FNE Nurse Liaison [phone]

## 2024-02-06 NOTE — Progress Notes (Signed)
 Chief Complaint  Patient presents with  . Foot Swelling    LEFT    Patient is agreeable to [employer] scribe.   History of Present Illness Hannah Hooper is a 89 year old female with atrial fibrillation and severe aortic stenosis who presents with left foot and ankle swelling.  She has been experiencing swelling in her left foot and ankle for the past three to four days. The swelling is not painful but is unusual for her. Soaking her foot did not alleviate the swelling. There is no history of recent trauma, new shoe wear, or changes in activity that could have contributed to the swelling. No recent nail trims or injuries to the foot. She has a corn on her foot that bothers her, but soaking has not provided relief. There is no significant pain in the swollen area. The swelling remains consistent throughout the day, with no changes upon waking in the morning.  Her past medical history includes a recent hospitalization in early June for atrial fibrillation and severe aortic stenosis. She is currently on amlodipine  and Eliquis . There are no prior surgeries or infections in the affected leg. No changes in her diet, specifically no increase in salty foods, and she primarily cooks at home with occasional pizza on Fridays.  During the review of symptoms, she denies any chest pain, breathing difficulties, or changes in blood pressure, which she monitors at home with readings typically in the 130s/60s. She also denies any history of blood clots or cellulitis in the affected leg.    ROS  Review of systems is unremarkable for any active cardiac, respiratory, GI, GU, hematologic, neurologic, dermatologic, HEENT, or psychiatric symptoms except as noted above.  No fevers, chills, or constitutional symptoms.   Current Outpatient Medications  Medication Sig Dispense Refill  . AMIOdarone  (PACERONE ) 200 MG tablet Take 1 tablet (200 mg total) by mouth once daily Take 1 tablet (200 mg total) by mouth 2 (two) times  daily. For 2 weeks and then start taking once a day 30 tablet 5  . amLODIPine  (NORVASC ) 5 MG tablet Take 1 tablet (5 mg total) by mouth once daily 90 tablet 1  . apixaban  (ELIQUIS ) 2.5 mg tablet Take 1 tablet (2.5 mg total) by mouth 2 (two) times daily 180 tablet 1  . aspirin  81 MG EC tablet Take 81 mg by mouth once daily    . atorvastatin  (LIPITOR ) 40 MG tablet Take 1 tablet (40 mg total) by mouth once daily 90 tablet 1  . nitroGLYcerin  (NITROSTAT ) 0.4 MG SL tablet Place 0.4 mg under the tongue every 5 (five) minutes as needed    . cyanocobalamin (VITAMIN B12) 1000 MCG tablet Take 1 tablet (1,000 mcg total) by mouth once daily (Patient not taking: Reported on 02/06/2024) 30 tablet 11  . donepeziL (ARICEPT) 5 MG tablet Take 1 tablet (5 mg total) by mouth at bedtime (Patient not taking: Reported on 02/06/2024) 90 tablet 3  . pravastatin (PRAVACHOL) 80 MG tablet TAKE 1 TABLET(80 MG) BY MOUTH EVERY NIGHT (Patient not taking: Reported on 02/06/2024) 90 tablet 1  . sulfaSALAzine  (AZULFIDINE ) 500 mg tablet TAKE 2 TABLETS(1000 MG) BY MOUTH TWICE DAILY (Patient not taking: Reported on 02/06/2024) 360 tablet 1   Current Facility-Administered Medications  Medication Dose Route Frequency Provider Last Rate Last Admin  . cyanocobalamin (VITAMIN B12) injection 1,000 mcg  1,000 mcg Intramuscular Q30 Days Lenon Layman Blush III, MD   1,000 mcg at 10/13/22 1107    Allergies as of 02/06/2024  . (  No Known Allergies)    Patient Active Problem List  Diagnosis  . Benign neoplasm of colon  . Carotid artery disease ()  . Hyperlipidemia  . Hypertensive kidney disease with CKD stage III (CMS/HHS-HCC)  . OA (osteoarthritis)  . Healthcare maintenance  . Prediabetes  . Aortic stenosis  . Dementia arising in the senium and presenium (CMS/HHS-HCC)  . Spells of decreased attentiveness  . B12 deficiency    Past Medical History:  Diagnosis Date  . Benign neoplasm of colon   . Carotid stenosis   . Colon  polyp   . Diverticulosis   . Essential hypertension, benign   . Lesion of ulnar nerve   . Osteoarthrosis involving, or with mention of more than one site, but not specified as generalized, multiple sites   . Other and unspecified hyperlipidemia   . Senile osteoporosis   . Unspecified inflammatory polyarthropathy (CMS/HHS-HCC)     Past Surgical History:  Procedure Laterality Date  . COLONOSCOPY  12/12/2008   (MUS) TUBULAR ADENOMA x 2  . Carpometacarpal CMC arthroplasty of thumb Left 06/24/2016   Dr.Menz  . HALLUX VALGUS CORRECTION    . LEFT CAROTID EDNARTERECTOMY 2008    . Polypectomy     colon  . RIGHT ULNAR NERVE TRANSPOSITION 2009      Vitals:   02/06/24 1143  BP: 132/60  Pulse: 64  SpO2: 96%  Weight: 55.2 kg (121 lb 12.8 oz)  Height: 152.4 cm (5')  PainSc: 0-No pain   Body mass index is 23.79 kg/m.  Exam BP 132/60 (BP Location: Left upper arm, Patient Position: Sitting, BP Cuff Size: Adult)   Pulse 64   Ht 152.4 cm (5')   Wt 55.2 kg (121 lb 12.8 oz)   SpO2 96%   BMI 23.79 kg/m   General. Well appearing; NAD; VS reviewed     Eyes. Sclera and conjunctiva clear; Vision grossly intact; extraocular movements intact Oropharynx. No suspicious lesions Neck. Supple. No swelling, masses, thyroid normal size, no masses palpated.   Lungs. Respirations unlabored; clear to auscultation bilaterally Cardiovascular. Heart regular rate and rhythm without murmurs, gallops, or rubs Extremities: without edema and with 2+ pulses bilaterally Skin. Normal color and turgor Neurologic. Alert and oriented x3; CN 2-12 grossly intact; no focal deficits  Assessment & Plan Left foot and ankle swelling Unilateral swelling without pain, redness, or heat. Differential includes venous insufficiency or fluid retention. Conservative management preferred.  At this time no concerns for DVT, cellulitis, or heart failure. - Advise ice application, elevation, and compression sock use. - Instruct to  monitor for increased redness or heat. - Advise dietary sodium restriction. - Follow up with Dr. Lenon in one week.  Atrial fibrillation Managed with apixaban  and amiodarone . No new symptoms related to atrial fibrillation.  Hypertension Blood pressure well-controlled with amlodipine .    F/U: Patient follow-up as needed.  JASON HESTLE WHITAKER, PA  This note has been created using automated tools and reviewed for accuracy by JASON HESTLE WHITAKER.   Note: This dictation was prepared with Dragon dictation along with smaller phrase technology. Any transcriptional errors that result from this process are unintentional.

## 2024-02-13 ENCOUNTER — Emergency Department: Payer: Medicare (Managed Care)

## 2024-02-13 ENCOUNTER — Inpatient Hospital Stay
Admission: EM | Admit: 2024-02-13 | Discharge: 2024-02-17 | DRG: 871 | Disposition: E | Payer: Medicare (Managed Care) | Attending: Pulmonary Disease | Admitting: Pulmonary Disease

## 2024-02-13 ENCOUNTER — Other Ambulatory Visit: Payer: Self-pay

## 2024-02-13 DIAGNOSIS — R55 Syncope and collapse: Secondary | ICD-10-CM | POA: Diagnosis present

## 2024-02-13 DIAGNOSIS — Z860100 Personal history of colon polyps, unspecified: Secondary | ICD-10-CM

## 2024-02-13 DIAGNOSIS — K921 Melena: Secondary | ICD-10-CM | POA: Diagnosis present

## 2024-02-13 DIAGNOSIS — I252 Old myocardial infarction: Secondary | ICD-10-CM | POA: Diagnosis not present

## 2024-02-13 DIAGNOSIS — R7401 Elevation of levels of liver transaminase levels: Secondary | ICD-10-CM

## 2024-02-13 DIAGNOSIS — K824 Cholesterolosis of gallbladder: Secondary | ICD-10-CM | POA: Diagnosis present

## 2024-02-13 DIAGNOSIS — D649 Anemia, unspecified: Secondary | ICD-10-CM | POA: Diagnosis not present

## 2024-02-13 DIAGNOSIS — Z7982 Long term (current) use of aspirin: Secondary | ICD-10-CM

## 2024-02-13 DIAGNOSIS — N179 Acute kidney failure, unspecified: Secondary | ICD-10-CM | POA: Diagnosis present

## 2024-02-13 DIAGNOSIS — I5A Non-ischemic myocardial injury (non-traumatic): Secondary | ICD-10-CM | POA: Diagnosis present

## 2024-02-13 DIAGNOSIS — Y92009 Unspecified place in unspecified non-institutional (private) residence as the place of occurrence of the external cause: Secondary | ICD-10-CM

## 2024-02-13 DIAGNOSIS — I468 Cardiac arrest due to other underlying condition: Secondary | ICD-10-CM | POA: Diagnosis present

## 2024-02-13 DIAGNOSIS — R6521 Severe sepsis with septic shock: Secondary | ICD-10-CM | POA: Diagnosis present

## 2024-02-13 DIAGNOSIS — R578 Other shock: Secondary | ICD-10-CM | POA: Diagnosis present

## 2024-02-13 DIAGNOSIS — G9340 Encephalopathy, unspecified: Secondary | ICD-10-CM | POA: Diagnosis not present

## 2024-02-13 DIAGNOSIS — J9601 Acute respiratory failure with hypoxia: Secondary | ICD-10-CM | POA: Diagnosis not present

## 2024-02-13 DIAGNOSIS — I10 Essential (primary) hypertension: Secondary | ICD-10-CM | POA: Diagnosis present

## 2024-02-13 DIAGNOSIS — D62 Acute posthemorrhagic anemia: Secondary | ICD-10-CM | POA: Diagnosis present

## 2024-02-13 DIAGNOSIS — E1122 Type 2 diabetes mellitus with diabetic chronic kidney disease: Secondary | ICD-10-CM | POA: Diagnosis present

## 2024-02-13 DIAGNOSIS — I5032 Chronic diastolic (congestive) heart failure: Secondary | ICD-10-CM | POA: Diagnosis present

## 2024-02-13 DIAGNOSIS — K922 Gastrointestinal hemorrhage, unspecified: Secondary | ICD-10-CM | POA: Diagnosis present

## 2024-02-13 DIAGNOSIS — I2510 Atherosclerotic heart disease of native coronary artery without angina pectoris: Secondary | ICD-10-CM | POA: Diagnosis present

## 2024-02-13 DIAGNOSIS — W19XXXA Unspecified fall, initial encounter: Principal | ICD-10-CM | POA: Diagnosis present

## 2024-02-13 DIAGNOSIS — G9341 Metabolic encephalopathy: Secondary | ICD-10-CM | POA: Diagnosis present

## 2024-02-13 DIAGNOSIS — K7200 Acute and subacute hepatic failure without coma: Secondary | ICD-10-CM | POA: Diagnosis present

## 2024-02-13 DIAGNOSIS — E872 Acidosis, unspecified: Secondary | ICD-10-CM | POA: Diagnosis present

## 2024-02-13 DIAGNOSIS — N1831 Chronic kidney disease, stage 3a: Secondary | ICD-10-CM | POA: Diagnosis present

## 2024-02-13 DIAGNOSIS — J9602 Acute respiratory failure with hypercapnia: Secondary | ICD-10-CM | POA: Diagnosis not present

## 2024-02-13 DIAGNOSIS — I2489 Other forms of acute ischemic heart disease: Secondary | ICD-10-CM | POA: Diagnosis present

## 2024-02-13 DIAGNOSIS — I480 Paroxysmal atrial fibrillation: Secondary | ICD-10-CM | POA: Diagnosis not present

## 2024-02-13 DIAGNOSIS — I255 Ischemic cardiomyopathy: Secondary | ICD-10-CM | POA: Diagnosis present

## 2024-02-13 DIAGNOSIS — Z79899 Other long term (current) drug therapy: Secondary | ICD-10-CM

## 2024-02-13 DIAGNOSIS — Z7901 Long term (current) use of anticoagulants: Secondary | ICD-10-CM

## 2024-02-13 DIAGNOSIS — Z515 Encounter for palliative care: Secondary | ICD-10-CM | POA: Diagnosis not present

## 2024-02-13 DIAGNOSIS — R579 Shock, unspecified: Secondary | ICD-10-CM | POA: Diagnosis not present

## 2024-02-13 DIAGNOSIS — I130 Hypertensive heart and chronic kidney disease with heart failure and stage 1 through stage 4 chronic kidney disease, or unspecified chronic kidney disease: Secondary | ICD-10-CM | POA: Diagnosis present

## 2024-02-13 DIAGNOSIS — E8729 Other acidosis: Secondary | ICD-10-CM | POA: Diagnosis not present

## 2024-02-13 DIAGNOSIS — A419 Sepsis, unspecified organism: Principal | ICD-10-CM | POA: Diagnosis present

## 2024-02-13 DIAGNOSIS — E785 Hyperlipidemia, unspecified: Secondary | ICD-10-CM | POA: Diagnosis not present

## 2024-02-13 DIAGNOSIS — I482 Chronic atrial fibrillation, unspecified: Secondary | ICD-10-CM | POA: Diagnosis present

## 2024-02-13 DIAGNOSIS — Z66 Do not resuscitate: Secondary | ICD-10-CM | POA: Diagnosis present

## 2024-02-13 DIAGNOSIS — Z803 Family history of malignant neoplasm of breast: Secondary | ICD-10-CM

## 2024-02-13 LAB — CBC WITH DIFFERENTIAL/PLATELET
Abs Immature Granulocytes: 0.04 K/uL (ref 0.00–0.07)
Basophils Absolute: 0 K/uL (ref 0.0–0.1)
Basophils Relative: 0 %
Eosinophils Absolute: 0 K/uL (ref 0.0–0.5)
Eosinophils Relative: 0 %
HCT: 26.6 % — ABNORMAL LOW (ref 36.0–46.0)
Hemoglobin: 8 g/dL — ABNORMAL LOW (ref 12.0–15.0)
Immature Granulocytes: 0 %
Lymphocytes Relative: 6 %
Lymphs Abs: 0.5 K/uL — ABNORMAL LOW (ref 0.7–4.0)
MCH: 27.7 pg (ref 26.0–34.0)
MCHC: 30.1 g/dL (ref 30.0–36.0)
MCV: 92 fL (ref 80.0–100.0)
Monocytes Absolute: 0.6 K/uL (ref 0.1–1.0)
Monocytes Relative: 7 %
Neutro Abs: 7.9 K/uL — ABNORMAL HIGH (ref 1.7–7.7)
Neutrophils Relative %: 87 %
Platelets: 256 K/uL (ref 150–400)
RBC: 2.89 MIL/uL — ABNORMAL LOW (ref 3.87–5.11)
RDW: 16.4 % — ABNORMAL HIGH (ref 11.5–15.5)
WBC: 9.1 K/uL (ref 4.0–10.5)
nRBC: 0 % (ref 0.0–0.2)

## 2024-02-13 LAB — COMPREHENSIVE METABOLIC PANEL WITH GFR
ALT: 885 U/L — ABNORMAL HIGH (ref 0–44)
AST: 933 U/L — ABNORMAL HIGH (ref 15–41)
Albumin: 3.4 g/dL — ABNORMAL LOW (ref 3.5–5.0)
Alkaline Phosphatase: 105 U/L (ref 38–126)
Anion gap: 16 — ABNORMAL HIGH (ref 5–15)
BUN: 47 mg/dL — ABNORMAL HIGH (ref 8–23)
CO2: 18 mmol/L — ABNORMAL LOW (ref 22–32)
Calcium: 8.5 mg/dL — ABNORMAL LOW (ref 8.9–10.3)
Chloride: 105 mmol/L (ref 98–111)
Creatinine, Ser: 1.75 mg/dL — ABNORMAL HIGH (ref 0.44–1.00)
GFR, Estimated: 27 mL/min — ABNORMAL LOW (ref >60)
Glucose, Bld: 159 mg/dL — ABNORMAL HIGH (ref 70–99)
Potassium: 4.6 mmol/L (ref 3.5–5.1)
Sodium: 139 mmol/L (ref 135–145)
Total Bilirubin: 0.8 mg/dL (ref 0.0–1.2)
Total Protein: 6 g/dL — ABNORMAL LOW (ref 6.5–8.1)

## 2024-02-13 LAB — ABO/RH: ABO/RH(D): O POS

## 2024-02-13 LAB — RETICULOCYTES
Immature Retic Fract: 29.7 % — ABNORMAL HIGH (ref 2.3–15.9)
RBC.: 2.85 MIL/uL — ABNORMAL LOW (ref 3.87–5.11)
Retic Count, Absolute: 140.8 K/uL (ref 19.0–186.0)
Retic Ct Pct: 4.9 % — ABNORMAL HIGH (ref 0.4–3.1)

## 2024-02-13 LAB — IRON AND TIBC
Iron: 20 ug/dL — ABNORMAL LOW (ref 28–170)
Saturation Ratios: 5 % — ABNORMAL LOW (ref 10.4–31.8)
TIBC: 403 ug/dL (ref 250–450)
UIBC: 383 ug/dL

## 2024-02-13 LAB — CBC
HCT: 23.4 % — ABNORMAL LOW (ref 36.0–46.0)
Hemoglobin: 7.3 g/dL — ABNORMAL LOW (ref 12.0–15.0)
MCH: 28.5 pg (ref 26.0–34.0)
MCHC: 31.2 g/dL (ref 30.0–36.0)
MCV: 91.4 fL (ref 80.0–100.0)
Platelets: 238 K/uL (ref 150–400)
RBC: 2.56 MIL/uL — ABNORMAL LOW (ref 3.87–5.11)
RDW: 16.6 % — ABNORMAL HIGH (ref 11.5–15.5)
WBC: 10.3 K/uL (ref 4.0–10.5)
nRBC: 0 % (ref 0.0–0.2)

## 2024-02-13 LAB — HEPATITIS PANEL, ACUTE
HCV Ab: NONREACTIVE
Hep A IgM: NONREACTIVE
Hep B C IgM: NONREACTIVE
Hepatitis B Surface Ag: NONREACTIVE

## 2024-02-13 LAB — FOLATE: Folate: 20.5 ng/mL (ref >5.9)

## 2024-02-13 LAB — PROTIME-INR
INR: 1.5 — ABNORMAL HIGH (ref 0.8–1.2)
Prothrombin Time: 18.9 s — ABNORMAL HIGH (ref 11.4–15.2)

## 2024-02-13 LAB — BRAIN NATRIURETIC PEPTIDE: B Natriuretic Peptide: 860.8 pg/mL — ABNORMAL HIGH (ref 0.0–100.0)

## 2024-02-13 LAB — AMMONIA: Ammonia: 14 umol/L (ref 9–35)

## 2024-02-13 LAB — PREPARE RBC (CROSSMATCH)

## 2024-02-13 LAB — TROPONIN I (HIGH SENSITIVITY)
Troponin I (High Sensitivity): 65 ng/L — ABNORMAL HIGH (ref <18)
Troponin I (High Sensitivity): 77 ng/L — ABNORMAL HIGH (ref <18)

## 2024-02-13 LAB — FERRITIN: Ferritin: 64 ng/mL (ref 11–307)

## 2024-02-13 LAB — APTT: aPTT: 33 s (ref 24–36)

## 2024-02-13 LAB — ACETAMINOPHEN LEVEL: Acetaminophen (Tylenol), Serum: <10 ug/mL — ABNORMAL LOW (ref 10–30)

## 2024-02-13 MED ORDER — ATROPINE SULFATE 1 MG/10ML IJ SOSY
1.0000 mg | PREFILLED_SYRINGE | INTRAMUSCULAR | Status: DC | PRN
  Administered 2024-02-14: 0.5 mg via INTRAVENOUS
  Filled 2024-02-13: qty 10

## 2024-02-13 MED ORDER — EMPTY CONTAINERS FLEXIBLE MISC
900.0000 mg | Freq: Once | Status: AC
  Administered 2024-02-13: 900 mg via INTRAVENOUS
  Filled 2024-02-13: qty 90

## 2024-02-13 MED ORDER — OXYCODONE HCL 5 MG PO TABS: 5.0000 mg | ORAL_TABLET | Freq: Four times a day (QID) | ORAL | Status: DC | PRN

## 2024-02-13 MED ORDER — PROTHROMBIN COMPLEX CONC HUMAN 500 UNITS IV KIT: 25.0000 [IU]/kg | PACK | Status: DC

## 2024-02-13 MED ORDER — MIDODRINE HCL 5 MG PO TABS
10.0000 mg | ORAL_TABLET | Freq: Three times a day (TID) | ORAL | Status: DC
  Administered 2024-02-13: 10 mg via ORAL
  Filled 2024-02-13: qty 2

## 2024-02-13 MED ORDER — VITAMIN K1 10 MG/ML IJ SOLN
10.0000 mg | INTRAVENOUS | Status: AC
  Administered 2024-02-13: 10 mg via INTRAVENOUS
  Filled 2024-02-13: qty 1

## 2024-02-13 MED ORDER — PANTOPRAZOLE SODIUM 40 MG IV SOLR
40.0000 mg | Freq: Two times a day (BID) | INTRAVENOUS | Status: DC
  Administered 2024-02-13: 40 mg via INTRAVENOUS
  Filled 2024-02-13: qty 10

## 2024-02-13 MED ORDER — ONDANSETRON HCL 4 MG/2ML IJ SOLN
4.0000 mg | Freq: Three times a day (TID) | INTRAMUSCULAR | Status: DC | PRN
  Administered 2024-02-13: 4 mg via INTRAVENOUS
  Filled 2024-02-13: qty 2

## 2024-02-13 MED ORDER — SODIUM CHLORIDE 0.9 % IV SOLN: Freq: Once | INTRAVENOUS | Status: DC

## 2024-02-13 MED ORDER — PROTHROMBIN COMPLEX CONC HUMAN 500 UNITS IV KIT: 1500.0000 [IU] | PACK | Status: DC

## 2024-02-13 MED ORDER — SODIUM CHLORIDE 0.9 % IV BOLUS
500.0000 mL | Freq: Once | INTRAVENOUS | Status: AC
  Administered 2024-02-13: 500 mL via INTRAVENOUS

## 2024-02-13 MED ORDER — SODIUM CHLORIDE 0.9 % IV SOLN: INTRAVENOUS | Status: DC

## 2024-02-13 NOTE — ED Provider Notes (Signed)
 [facility] Provider Note   Event Date/Time   First MD Initiated Contact with Patient 02/13/24 1454     (approximate) History  Fall  HPI Hannah Hooper is a 89 y.o. female past medical history of atrial fibrillation on Eliquis , chronic kidney disease, and diverticulosis who presents complaining of an unwitnessed fall last night hitting the right lower back of her head.  Patient has had 2 episodes of vomiting since this injury to her head.  Patient also had an episode of syncope close to midnight after this fall.  Patient was caught by a family member and did not have any further head trauma.  Patient denies any complaints at this time ROS: Patient currently denies any vision changes, tinnitus, difficulty speaking, facial droop, sore throat, chest pain, shortness of breath, abdominal pain, nausea/vomiting/diarrhea, dysuria, or weakness/numbness/paresthesias in any extremity   Physical Exam  Triage Vital Signs: ED Triage Vitals  Encounter Vitals Group     BP 02/13/24 1150 (!) 120/44     Girls Systolic BP Percentile --      Girls Diastolic BP Percentile --      Boys Systolic BP Percentile --      Boys Diastolic BP Percentile --      Pulse Rate 02/13/24 1150 62     Resp 02/13/24 1150 17     Temp 02/13/24 1150 97.9 F (36.6 C)     Temp src --      SpO2 02/13/24 1150 98 %     Weight 02/13/24 1152 115 lb (52.2 kg)     Height 02/13/24 1152 5' (1.524 m)     Head Circumference --      Peak Flow --      Pain Score 02/13/24 1151 0     Pain Loc --      Pain Education --      Exclude from Growth Chart --    Most recent vital signs: Vitals:   02/13/24 2211 02/13/24 2234  BP: (!) 104/38 (!) 102/36  Pulse: (!) 52 (!) 46  Resp: (!) 26 (!) 25  Temp: 97.6 F (36.4 C) 97.6 F (36.4 C)  SpO2: 100%    General: Awake, oriented x4. CV:  Good peripheral perfusion. Resp:  Normal effort. Abd:  No distention. Other:  Elderly well-developed, well-nourished Caucasian female  resting comfortably in no acute distress ED Results / Procedures / Treatments  Labs (all labs ordered are listed, but only abnormal results are displayed) Labs Reviewed  CBC WITH DIFFERENTIAL/PLATELET - Abnormal; Notable for the following components:      Result Value   RBC 2.89 (*)    Hemoglobin 8.0 (*)    HCT 26.6 (*)    RDW 16.4 (*)    Neutro Abs 7.9 (*)    Lymphs Abs 0.5 (*)    All other components within normal limits  COMPREHENSIVE METABOLIC PANEL WITH GFR - Abnormal; Notable for the following components:   CO2 18 (*)    Glucose, Bld 159 (*)    BUN 47 (*)    Creatinine, Ser 1.75 (*)    Calcium  8.5 (*)    Total Protein 6.0 (*)    Albumin 3.4 (*)    AST 933 (*)    ALT 885 (*)    GFR, Estimated 27 (*)    Anion gap 16 (*)    All other components within normal limits  PROTIME-INR - Abnormal; Notable for the following components:   Prothrombin  Time 18.9 (*)  INR 1.5 (*)    All other components within normal limits  CBC - Abnormal; Notable for the following components:   RBC 2.56 (*)    Hemoglobin 7.3 (*)    HCT 23.4 (*)    RDW 16.6 (*)    All other components within normal limits  IRON AND TIBC - Abnormal; Notable for the following components:   Iron 20 (*)    Saturation Ratios 5 (*)    All other components within normal limits  RETICULOCYTES - Abnormal; Notable for the following components:   Retic Ct Pct 4.9 (*)    RBC. 2.85 (*)    Immature Retic Fract 29.7 (*)    All other components within normal limits  ACETAMINOPHEN  LEVEL - Abnormal; Notable for the following components:   Acetaminophen  (Tylenol ), Serum <10 (*)    All other components within normal limits  BRAIN NATRIURETIC PEPTIDE - Abnormal; Notable for the following components:   B Natriuretic Peptide 860.8 (*)    All other components within normal limits  TROPONIN I (HIGH SENSITIVITY) - Abnormal; Notable for the following components:   Troponin I (High Sensitivity) 65 (*)    All other components  within normal limits  TROPONIN I (HIGH SENSITIVITY) - Abnormal; Notable for the following components:   Troponin I (High Sensitivity) 77 (*)    All other components within normal limits  HEPATITIS PANEL, ACUTE  FOLATE  FERRITIN  APTT  AMMONIA  CBC  VITAMIN B12  COMPREHENSIVE METABOLIC PANEL WITH GFR  CBC  TYPE AND SCREEN  PREPARE RBC (CROSSMATCH)  ABO/RH  TROPONIN I (HIGH SENSITIVITY)   EKG ED ECG REPORT I, Artist MARLA Kerns, the attending physician, personally viewed and interpreted this ECG. Date: 02/13/2024 EKG Time: 1206 Rate: 60 Rhythm: Atrial fibrillation QRS Axis: normal Intervals: normal ST/T Wave abnormalities: normal Narrative Interpretation: Rate controlled atrial fibrillation.  No evidence of acute ischemia RADIOLOGY ED MD interpretation: CT of the head without contrast interpreted by me shows no evidence of acute abnormalities including no intracerebral hemorrhage, obvious masses, or significant edema - All radiology independently interpreted and agree with radiology assessment Official radiology report(s): US  ABDOMEN LIMITED RUQ (LIVER/GB) Result Date: 02/13/2024 CLINICAL DATA:  Transaminitis EXAM: ULTRASOUND ABDOMEN LIMITED RIGHT UPPER QUADRANT COMPARISON:  CT scan 04/22/2023 FINDINGS: Gallbladder: No gallstones. 9 mm gallbladder polyp with thick stalk. No sonographic Murphy sign noted by sonographer. Common bile duct: Diameter: 0.2 cm Liver: No focal lesion identified. Within normal limits in parenchymal echogenicity. Portal vein is patent on color Doppler imaging with normal direction of blood flow towards the liver. Other: None. IMPRESSION: 1. No gallstones or biliary ductal dilatation. 2. 9 mm gallbladder polyp. The thick stalk makes this a low risk polyp. Low risk polyps of this size warrant follow up ultrasound at 12 months based on the Society of radiologists and ultrasound 2021 guidelines. Accordingly, follow up hepatobiliary ultrasound is recommended in 1 years  time. Electronically Signed   By: Ryan Salvage M.D.   On: 02/13/2024 16:30   CT Head Wo Contrast Result Date: 02/13/2024 CLINICAL DATA:  Provided history: Fall on Eliquis . EXAM: CT HEAD WITHOUT CONTRAST TECHNIQUE: Contiguous axial images were obtained from the base of the skull through the vertex without intravenous contrast. RADIATION DOSE REDUCTION: This exam was performed according to the departmental dose-optimization program which includes automated exposure control, adjustment of the mA and/or kV according to patient size and/or use of iterative reconstruction technique. COMPARISON:  None. FINDINGS: Brain: Generalized cerebral atrophy. There is  no acute intracranial hemorrhage. No demarcated cortical infarct. No extra-axial fluid collection. No evidence of an intracranial mass. No midline shift. Vascular: No hyperdense vessel.  Atherosclerotic calcifications. Skull: No calvarial fracture or aggressive osseous lesion. Sinuses/Orbits: No mass or acute finding within the imaged orbits. No significant paranasal sinus disease at the imaged levels. IMPRESSION: 1.  No evidence of an acute intracranial abnormality. 2. Generalized cerebral atrophy. Electronically Signed   By: Rockey Childs D.O.   On: 02/13/2024 13:02   PROCEDURES: Critical Care performed: No Procedures MEDICATIONS ORDERED IN ED: Medications  0.9 %  sodium chloride  infusion ( Intravenous New Bag/Given 02/13/24 1933)  pantoprazole  (PROTONIX ) injection 40 mg (40 mg Intravenous Given 02/13/24 2151)  ondansetron  (ZOFRAN ) injection 4 mg (4 mg Intravenous Given 02/13/24 1943)  midodrine  (PROAMATINE ) tablet 10 mg (10 mg Oral Given 02/13/24 1957)  oxyCODONE  (Oxy IR/ROXICODONE ) immediate release tablet 5 mg (has no administration in time range)  0.9 %  sodium chloride  infusion ( Intravenous See Procedure Record 02/13/24 2223)  atropine  1 MG/10ML injection 1 mg (has no administration in time range)  coag fact Xa recombinant (ANDEXXA ) low dose  infusion 900 mg (has no administration in time range)  sodium chloride  0.9 % bolus 500 mL (0 mLs Intravenous Stopped 02/13/24 2148)  sodium chloride  0.9 % bolus 500 mL (0 mLs Intravenous Stopped 02/13/24 2249)  phytonadione  (VITAMIN K ) 10 mg in dextrose  5 % 50 mL IVPB (10 mg Intravenous New Bag/Given 02/13/24 2248)   IMPRESSION / MDM / ASSESSMENT AND PLAN / ED COURSE  I reviewed the triage vital signs and the nursing notes.                             The patient is on the cardiac monitor to evaluate for evidence of arrhythmia and/or significant heart rate changes. Patient's presentation is most consistent with acute presentation with potential threat to life or bodily function.  This patient presents to the ED for concern of fall with nausea/vomiting, this involves an extensive number of treatment options, and is a complaint that carries with it a high risk of complications and morbidity.  The differential diagnosis includes arrhythmia, CVA, orthostatic syncope, anemia Co morbidities that complicate the patient evaluation  Atrial fibrillation on Eliquis  Additional history obtained:  Additional history obtained from patient's son at bedside  External records from outside source obtained and reviewed including internal medicine visit 02/06/2024 Lab Tests:  I Ordered, and personally interpreted labs.  The pertinent results include: AST 933, ALT 885, anion gap 16, creatinine 1.75, hemoglobin 7.3, troponin 65 Imaging Studies ordered:  I ordered imaging studies including head CT and ultrasound of the right upper quadrant  I independently visualized and interpreted imaging which showed no evidence of acute abnormalities  I agree with the radiologist interpretation Cardiac Monitoring: / EKG:  The patient was maintained on a cardiac monitor.  I personally viewed and interpreted the cardiac monitored which showed an underlying rhythm of: Normal sinus rhythm Consultations Obtained:  I requested  consultation with the on-call GI physician, Dr. Jinny,  and discussed lab and imaging findings as well as pertinent plan - they recommend: Admission, hepatitis testing, and trending LFTs Problem List / ED Course / Critical interventions / Medication management  Transaminitis, fall, nausea/vomiting  I have reviewed the patients home medicines and have made adjustments as needed Dispo: Admit to medicine       FINAL CLINICAL IMPRESSION(S) / ED DIAGNOSES  Final diagnoses:  Fall, initial encounter  Transaminitis   Rx / DC Orders   ED Discharge Orders     None      Note:  This document was prepared using Dragon voice recognition software and may include unintentional dictation errors.   Jossie Artist POUR, MD 02/13/24 [phone]

## 2024-02-13 NOTE — ED Notes (Signed)
 In room to assess pts BP and SpO2. SpO2 88-90% on room air - placed on 4L [place]. Hilma, MD paged.

## 2024-02-13 NOTE — ED Notes (Signed)
 Blood bank called for transfusion update.

## 2024-02-13 NOTE — ED Notes (Signed)
 Episode of altered mentation where pt was slow to respond. On phone with Hilma, MD

## 2024-02-13 NOTE — ED Notes (Signed)
Niu, MD at bedside. °

## 2024-02-13 NOTE — ED Triage Notes (Signed)
 Pt comes in via pov with complaints of an unwitnessed fall last night around 10pm. Pt with swelling and pain to the right lower back of the head. Unsure if pt had LOC with fall. Pt was seen by EMS last night, but refused transport. Pt's family reports 2 episodes of vomiting during the night while ambulating to the bathroom.  Family reports that patient passed out close to midnight after her fall while ambulating. Pt was caught by family member. Pt is currently on Eliquis . Pt with no complaints at this time. Pt is alert and oriented x3, and is usually x4 at baseline.

## 2024-02-13 NOTE — ED Notes (Signed)
 CCMD called to initiate cardiac monitoring

## 2024-02-13 NOTE — H&P (Incomplete)
 History and Physical    Hannah Hooper FMW:[id number] DOB: 1931/08/01 DOA: 02/13/2024  Referring MD/NP/PA:   PCP: Lenon Layman ORN, MD   Patient coming from:  The patient is coming from home.     Chief Complaint: Rectal bleeding, fall, syncope  HPI: Hannah Hooper is a 89 y.o. female with medical history significant of hypertension, hyperlipidemia, CAD, non-STEMI,dCHF, CKD 3A, A-fib on Eliquis , diverticulosis, anemia, who presents with rectal bleeding, fall, syncope.  Patient states that she has intermittent rectal bleeding with bright red blood for almost 1 week.  She has nausea vomited twice today, no abdominal pain or diarrhea.  No fever or chills.  Patient does not have chest pain, cough, SOB.  Per report, patient had a witnessed fall and passed out last night at about 10 PM. She refused transportation last night. She complains of upper back pain.  No headache or neck pain.  No symptoms of UTI.  She took last dose Eliquis  in AM of 7/28.  Patient was found to have hemoglobin dropped from 12.7 on 12/31/2023 --> 8.0 --> then 7.3.  Her blood pressure has been running low to 91/43 (58) in ED. She has bradycardia with heart rate 45-60s.  Data reviewed independently and ED Course: pt was found to have WBC 9.1, BNP 860.8, worsening renal function with creatinine 1.75, BUN 47 and EGFR 27 (recent baseline creatinine 1.02 on 01/01/2024), troponin 65 --> 77, INR 1.5, INR 33, abnormal liver function (ALP 105, AST 973, ALT 885, total bilirubin 0.8), Tylenol  level less than 10, ammonia level 14.  Temperature normal, RR 15, oxygen saturation 98% on room air.  Chest x-ray negative.  CT of head negative for acute intracranial abnormalities.  ED physician consulted Dr. Jinny of GI.   RUQ-US : 1. No gallstones or biliary ductal dilatation. 2. 9 mm gallbladder polyp. The thick stalk makes this a low risk polyp. Low risk polyps of this size warrant follow up ultrasound at 12 months based on the Society of  radiologists and ultrasound 2021 guidelines. Accordingly, follow up hepatobiliary ultrasound is recommended in 1 years time.  EKG: I have personally reviewed.  Seems to be in A-fib, diffuse ST depression.   Review of Systems:   General: no fevers, chills, no body weight gain, has poor appetite, has fatigue HEENT: no blurry vision, hearing changes or sore throat Respiratory: no dyspnea, coughing, wheezing CV: no chest pain, no palpitations GI: has nausea, vomiting, no abdominal pain, diarrhea, constipation, has rectal bleeding GU: no dysuria, burning on urination, increased urinary frequency, hematuria  Ext: has trace leg edema Neuro: no unilateral weakness, numbness, or tingling, no vision change or hearing loss. Has fall and syncope Skin: no rash, no skin tear. MSK: No muscle spasm, no deformity, no limitation of range of movement in spin.   Heme: No easy bruising.  Travel history: No recent long distant travel.   Allergy: No Known Allergies  Past Medical History:  Diagnosis Date   Benign neoplasm of colon, unspecified    Carotid artery disease (HCC)    Diverticulosis    Hyperlipemia    Hypertension    Hypertensive kidney disease with CKD stage III (HCC)    Lesion of ulnar nerve    Osteoarthritis    Valvular heart disease    a. 02/2020 Echo: EF 50%, triv AI, mild AS/MR/MS/TR.    Past Surgical History:  Procedure Laterality Date   bunions  1990   CARPOMETACARPAL ([facility]) FUSION OF THUMB Left 06/24/2016   Procedure:  CARPOMETACARPAL [facility]) ARthroplasty OF THUMB;  Surgeon: Ozell Flake, MD;  Location: [facility] ORS;  Service: Orthopedics;  Laterality: Left;   CATARACT EXTRACTION Bilateral    EYE SURGERY Bilateral 2014   cataract surgery   GAS/FLUID EXCHANGE Left 03/02/2018   Procedure: GAS/FLUID EXCHANGE;  Surgeon: Valdemar Rogue, MD;  Location: [facility] OR;  Service: Ophthalmology;  Laterality: Left;   MEMBRANE PEEL Left 03/02/2018   Procedure: MEMBRANE PEEL;  Surgeon: Valdemar Rogue, MD;   Location: [facility] OR;  Service: Ophthalmology;  Laterality: Left;   PARS PLANA VITRECTOMY Left 03/02/2018   Procedure: PARS PLANA VITRECTOMY WITH 25 GAUGE;  Surgeon: Valdemar Rogue, MD;  Location: [facility] OR;  Service: Ophthalmology;  Laterality: Left;   RIGHT/LEFT HEART CATH AND CORONARY ANGIOGRAPHY N/A 04/11/2023   Procedure: RIGHT/LEFT HEART CATH AND CORONARY ANGIOGRAPHY;  Surgeon: Darron Deatrice LABOR, MD;  Location: [facility] INVASIVE CV LAB;  Service: Cardiovascular;  Laterality: N/A;   SHOULDER ARTHROSCOPY Right 2014   TONSILLECTOMY     as a child   ULNAR NERVE TRANSPOSITION Right     Social History:  reports that she has never smoked. She has never used smokeless tobacco. She reports that she does not currently use alcohol . She reports that she does not use drugs.  Family History:  Family History  Problem Relation Age of Onset   Breast cancer Mother 44     Prior to Admission medications   Medication Sig Start Date End Date Taking? Authorizing Provider  amiodarone  (PACERONE ) 200 MG tablet Take 1 tablet (200 mg total) by mouth 2 (two) times daily. For 2 weeks and then start taking once a day 01/01/24   Amin, Sumayya, MD  amLODipine  (NORVASC ) 5 MG tablet Take 1 tablet (5 mg total) by mouth daily. 04/12/23   Darci Pore, MD  apixaban  (ELIQUIS ) 2.5 MG TABS tablet Take 1 tablet (2.5 mg total) by mouth 2 (two) times daily. 04/12/23   Darci Pore, MD  aspirin  81 MG chewable tablet Chew 1 tablet (81 mg total) by mouth daily. 04/13/23   Darci Pore, MD  atorvastatin  (LIPITOR ) 40 MG tablet Take 1 tablet (40 mg total) by mouth daily. 04/13/23   Darci Pore, MD  cyanocobalamin (VITAMIN B12) 1000 MCG tablet Take 1 tablet (1,000 mcg total) by mouth daily. 04/12/23   Darci Pore, MD  nitroGLYCERIN  (NITROSTAT ) 0.4 MG SL tablet PLACE ONE TABLET UNDER TONGUE AS NEEDED FOR CHEST PAIN EVERY 5 MINUTES 07/25/23   Wittenborn, Barnie, NP  sulfaSALAzine  (AZULFIDINE ) 500 MG tablet Take  2 tablets (1,000 mg total) by mouth daily. 04/12/23   Darci Pore, MD    Physical Exam: Vitals:   02/13/24 2245 02/13/24 2315 02/13/24 2330 02/13/24 2345  BP: (!) 87/58 (!) 93/41  (!) 113/43  Pulse: (!) 47 (!) 48 (!) 49 (!) 47  Resp: 20 (!) 21 (!) 24 (!) 21  Temp:      TempSrc:      SpO2: 100% 98% 100% 100%  Weight:      Height:       General: Not in acute distress HEENT:       Eyes: PERRL, EOMI, no jaundice       ENT: No discharge from the ears and nose, no pharynx injection, no tonsillar enlargement.        Neck: No JVD, no bruit, no mass felt. Heme: No neck lymph node enlargement. Cardiac: S1/S2, irregularly irregular rhythm, no gallops or rubs. Respiratory: No rales, wheezing, rhonchi or rubs. GI: Soft, nondistended, nontender, no rebound  pain, no organomegaly, BS present. GU: No hematuria Ext: has trace leg edema bilaterally. 1+DP/PT pulse bilaterally. Musculoskeletal: No joint deformities, No joint redness or warmth, no limitation of ROM in spin. Skin: No rashes.  Neuro: Alert, oriented X3, cranial nerves II-XII grossly intact, moves all extremities normally Psych: Patient is not psychotic, no suicidal or hemocidal ideation.  Labs on Admission: I have personally reviewed following labs and imaging studies  CBC: Recent Labs  Lab 02/13/24 1158 02/13/24 1952  WBC 9.1 10.3  NEUTROABS 7.9*  --   HGB 8.0* 7.3*  HCT 26.6* 23.4*  MCV 92.0 91.4  PLT 256 238   Basic Metabolic Panel: Recent Labs  Lab 02/13/24 1158  NA 139  K 4.6  CL 105  CO2 18*  GLUCOSE 159*  BUN 47*  CREATININE 1.75*  CALCIUM  8.5*   GFR: Estimated Creatinine Clearance: 15.3 mL/min (A) (by C-G formula based on SCr of 1.75 mg/dL (H)). Liver Function Tests: Recent Labs  Lab 02/13/24 1158  AST 933*  ALT 885*  ALKPHOS 105  BILITOT 0.8  PROT 6.0*  ALBUMIN 3.4*   No results for input(s): LIPASE, AMYLASE in the last 168 hours. Recent Labs  Lab 02/13/24 1952  AMMONIA 14    Coagulation Profile: Recent Labs  Lab 02/13/24 1158  INR 1.5*   Cardiac Enzymes: No results for input(s): CKTOTAL, CKMB, CKMBINDEX, TROPONINI in the last 168 hours. BNP (last 3 results) No results for input(s): PROBNP in the last 8760 hours. HbA1C: No results for input(s): HGBA1C in the last 72 hours. CBG: No results for input(s): GLUCAP in the last 168 hours. Lipid Profile: No results for input(s): CHOL, HDL, LDLCALC, TRIG, CHOLHDL, LDLDIRECT in the last 72 hours. Thyroid Function Tests: No results for input(s): TSH, T4TOTAL, FREET4, T3FREE, THYROIDAB in the last 72 hours. Anemia Panel: Recent Labs    02/13/24 1158  FOLATE 20.5  FERRITIN 64  TIBC 403  IRON 20*  RETICCTPCT 4.9*   Urine analysis: No results found for: COLORURINE, APPEARANCEUR, LABSPEC, PHURINE, GLUCOSEU, HGBUR, BILIRUBINUR, KETONESUR, PROTEINUR, UROBILINOGEN, NITRITE, LEUKOCYTESUR Sepsis Labs: @LABRCNTIP (procalcitonin:4,lacticidven:4) )No results found for this or any previous visit (from the past 240 hours).   Radiological Exams on Admission:   Assessment/Plan Principal Problem:   GI bleeding Active Problems:   Acute blood loss anemia   Syncope   Fall at home, initial encounter   CAD (coronary artery disease)   Myocardial injury   HTN (hypertension)   HLD (hyperlipidemia)   Atrial fibrillation, chronic (HCC)   Chronic diastolic CHF (congestive heart failure) (HCC)   Acute renal failure superimposed on stage 3a chronic kidney disease (HCC)   Gallbladder polyp   Assessment and Plan:  Addendum: Patient mental status has deteriorated,  becomes unresponsible, and is in coma when I reevaluate her in ED. etiology is not clear.  Blood pressure stable.  VBG showed pH 7.18, CO2 31, O2 55.  Repeated CT scan of head which is negative for acute intracranial abnormalities. I consulted ICU NP,  Britton Rust-Chster who suspect patient may have  stroke/metabolic encephalopathy.  Since patient cannot protect her airway.  Patient will be transferred to ICU. - Stat chest x-ray, lactic acid and BMI per [employer].   GI bleeding and acute blood loss anemia: Hgb 12.7 --> 8.0 --> 7.3.  Patient reports having rectal bleeding with bright red blood, indicating possible lower GI bleeding given history of diverticulosis.  Since patient has worsening renal function with GFR 27, cannot to CT scan-GI bleed.  Pt is on Eliquis , last dose was in the morning of 7/28.   EDP consulted Dr. Jinny of GI.   - will admitted to SDU as inpatient - Will give 1 dose of Andexxa  to reverse Eliquis . - hold Eliquis  and ASA - transfuse 1 unit of blood now - NPO, pending GI consult.  - IVF: 500 mL NS bolus x 2, then at 50 mL/hr - Start IV pantoprazole  40 mg bid - Check anemia panel - Zofran  IV for nausea - Avoid NSAIDs and SQ heparin  - Maintain IV access (2 large bore IVs if possible). - Monitor closely and follow q6h cbc, transfuse as necessary, if Hgb<7.0 - May consider RBC nuclear scan in AM if patient continues to bleed.  Abnormal liver function: ALP 105, AST 973, ALT 885, total bilirubin 0.8.  Etiology is not clear.  Tylenol  level less than 10.  Hepatitis panel negative. RUQ-US  showed no gallstones or biliary ductal dilatation, but showed gallbladder polyp -will hold lipitor  - Avoid using Tylenol  - Follow-up GI recommendations  Syncope and Fall at home, initial encounter: Etiology is not clear for syncope.  CT of head negative.  Patient does not have focal neurodeficit on physical examination.  May be due to volume depletion secondary to GI bleeding - Frequent neurocheck - Fall precaution - PT/OT when able to (not ordered yet) - Follow-up CT of C-spine at 8:00 (after pt is fully stabilized) - Follow-up x-ray of T-spine due to upper back pain  CAD (coronary artery disease) and myocardial injury: Troponin 65- > 77.  No chest pain.  Likely demand  ischemia. -Hold aspirin  and Lipitor  as above - Trend troponin  HTN (hypertension): Blood pressure is soft in the low. -Hold amlodipine  - Patient is started on midodrine  10 mg 3 times daily for low blood pressure  HLD (hyperlipidemia) -Hold Lipitor  due to abnormal liver function  Atrial fibrillation, chronic [facility]): Patient has bradycardia, heart rate 40-60s -Hold amiodarone  - Hold Eliquis  - As needed atropine  1 mg for symptomatic and unstable bradycardia  Chronic diastolic CHF (congestive heart failure) (HCC): 2D echo 04/11/2023 showed EF of 55 to 60% with grade 2 diastolic dysfunction.  BNP is elevated at 860, but patient has only trace leg edema, denies SOB.  No oxygen desaturation.  Chest x-ray negative for edema, does not have CHF exacerbation yet.  Patient is obviously at high risk of developing CHF exacerbation. -Watch volume status closely - Will not give diuretics due to GI bleeding, softer blood pressure and worsening renal function.  Acute renal failure superimposed on stage 3a chronic kidney disease (HCC): - IV fluid as above  Gallbladder polyp: Ultrasound showed a 9 mm gallbladder polyp. The thick stalk makes this a low risk polyp. Low risk polyps of this size warrant follow up ultrasound at 12 months based on the Society of radiologists and ultrasound 2021 guidelines. Accordingly, follow up hepatobiliary ultrasound is recommended in 1 years time per radiologist recommendation. - Follow-up with PCP to repeat ultrasound.        DVT ppx: SCD  Code Status: Full code    Family Communication: I called her son by phone   Disposition Plan:  Anticipate discharge back to previous environment  Consults called:  ED physician consulted Dr. Jinny of GI.   Admission status and Level of care: Stepdown:  as inpt        Dispo: The patient is from: Home              Anticipated d/c is  to: Home              Anticipated d/c date is: 2 days              Patient currently is not  medically stable to d/c.    Severity of Illness:  The appropriate patient status for this patient is INPATIENT. Inpatient status is judged to be reasonable and necessary in order to provide the required intensity of service to ensure the patient's safety. The patient's presenting symptoms, physical exam findings, and initial radiographic and laboratory data in the context of their chronic comorbidities is felt to place them at high risk for further clinical deterioration. Furthermore, it is not anticipated that the patient will be medically stable for discharge from the hospital within 2 midnights of admission.   * I certify that at the point of admission it is my clinical judgment that the patient will require inpatient hospital care spanning beyond 2 midnights from the point of admission due to high intensity of service, high risk for further deterioration and high frequency of surveillance required.*       Date of Service 2024-03-01    Caleb Exon [facility]   If 7PM, 7AM, please contact night-coverage [email or web address] March 01, 2024, 12:12 AM

## 2024-02-14 ENCOUNTER — Inpatient Hospital Stay: Payer: Medicare (Managed Care)

## 2024-02-14 ENCOUNTER — Other Ambulatory Visit: Payer: Self-pay

## 2024-02-14 DIAGNOSIS — J9602 Acute respiratory failure with hypercapnia: Secondary | ICD-10-CM

## 2024-02-14 DIAGNOSIS — K922 Gastrointestinal hemorrhage, unspecified: Secondary | ICD-10-CM | POA: Diagnosis not present

## 2024-02-14 DIAGNOSIS — I480 Paroxysmal atrial fibrillation: Secondary | ICD-10-CM

## 2024-02-14 DIAGNOSIS — K7200 Acute and subacute hepatic failure without coma: Secondary | ICD-10-CM

## 2024-02-14 DIAGNOSIS — G9341 Metabolic encephalopathy: Secondary | ICD-10-CM

## 2024-02-14 DIAGNOSIS — R579 Shock, unspecified: Secondary | ICD-10-CM

## 2024-02-14 DIAGNOSIS — G9340 Encephalopathy, unspecified: Secondary | ICD-10-CM

## 2024-02-14 DIAGNOSIS — D649 Anemia, unspecified: Secondary | ICD-10-CM

## 2024-02-14 DIAGNOSIS — E8729 Other acidosis: Secondary | ICD-10-CM

## 2024-02-14 DIAGNOSIS — J9601 Acute respiratory failure with hypoxia: Secondary | ICD-10-CM

## 2024-02-14 LAB — COMPREHENSIVE METABOLIC PANEL WITH GFR
ALT: 2027 U/L — ABNORMAL HIGH (ref 0–44)
AST: 1969 U/L — ABNORMAL HIGH (ref 15–41)
Albumin: 3.2 g/dL — ABNORMAL LOW (ref 3.5–5.0)
Alkaline Phosphatase: 86 U/L (ref 38–126)
Anion gap: 19 — ABNORMAL HIGH (ref 5–15)
BUN: 59 mg/dL — ABNORMAL HIGH (ref 8–23)
CO2: 9 mmol/L — ABNORMAL LOW (ref 22–32)
Calcium: 7.6 mg/dL — ABNORMAL LOW (ref 8.9–10.3)
Chloride: 109 mmol/L (ref 98–111)
Creatinine, Ser: 2.34 mg/dL — ABNORMAL HIGH (ref 0.44–1.00)
GFR, Estimated: 19 mL/min — ABNORMAL LOW (ref >60)
Glucose, Bld: 124 mg/dL — ABNORMAL HIGH (ref 70–99)
Potassium: 4.7 mmol/L (ref 3.5–5.1)
Sodium: 137 mmol/L (ref 135–145)
Total Bilirubin: 1.9 mg/dL — ABNORMAL HIGH (ref 0.0–1.2)
Total Protein: 5.8 g/dL — ABNORMAL LOW (ref 6.5–8.1)

## 2024-02-14 LAB — CBC
HCT: 30.7 % — ABNORMAL LOW (ref 36.0–46.0)
HCT: 29.7 % — ABNORMAL LOW (ref 36.0–46.0)
Hemoglobin: 9.7 g/dL — ABNORMAL LOW (ref 12.0–15.0)
Hemoglobin: 8.9 g/dL — ABNORMAL LOW (ref 12.0–15.0)
MCH: 28.7 pg (ref 26.0–34.0)
MCH: 28.3 pg (ref 26.0–34.0)
MCHC: 31.6 g/dL (ref 30.0–36.0)
MCHC: 30 g/dL (ref 30.0–36.0)
MCV: 90.8 fL (ref 80.0–100.0)
MCV: 94.3 fL (ref 80.0–100.0)
Platelets: 235 K/uL (ref 150–400)
Platelets: 132 K/uL — ABNORMAL LOW (ref 150–400)
RBC: 3.38 MIL/uL — ABNORMAL LOW (ref 3.87–5.11)
RBC: 3.15 MIL/uL — ABNORMAL LOW (ref 3.87–5.11)
RDW: 16.2 % — ABNORMAL HIGH (ref 11.5–15.5)
RDW: 17 % — ABNORMAL HIGH (ref 11.5–15.5)
WBC: 16.9 K/uL — ABNORMAL HIGH (ref 4.0–10.5)
WBC: 21.1 K/uL — ABNORMAL HIGH (ref 4.0–10.5)
nRBC: 0 % (ref 0.0–0.2)
nRBC: 0.1 % (ref 0.0–0.2)

## 2024-02-14 LAB — TROPONIN I (HIGH SENSITIVITY)
Troponin I (High Sensitivity): 107 ng/L (ref <18)
Troponin I (High Sensitivity): 113 ng/L (ref <18)
Troponin I (High Sensitivity): 85 ng/L — ABNORMAL HIGH (ref <18)

## 2024-02-14 LAB — BLOOD GAS, ARTERIAL
Acid-base deficit: 21.1 mmol/L — ABNORMAL HIGH (ref 0.0–2.0)
Bicarbonate: 7.4 mmol/L — ABNORMAL LOW (ref 20.0–28.0)
FIO2: 40 %
MECHVT: 380 mL
Mechanical Rate: 18
O2 Saturation: 95.9 %
PEEP: 5 cmH2O
Patient temperature: 37
pCO2 arterial: 25 mmHg — ABNORMAL LOW (ref 32–48)
pH, Arterial: 7.08 — CL (ref 7.35–7.45)
pO2, Arterial: 84 mmHg (ref 83–108)

## 2024-02-14 LAB — TYPE AND SCREEN
ABO/RH(D): O POS
Antibody Screen: NEGATIVE
Unit division: 0

## 2024-02-14 LAB — PHOSPHORUS: Phosphorus: 7.9 mg/dL — ABNORMAL HIGH (ref 2.5–4.6)

## 2024-02-14 LAB — BLOOD GAS, VENOUS
Acid-base deficit: 15.5 mmol/L — ABNORMAL HIGH (ref 0.0–2.0)
Bicarbonate: 11.6 mmol/L — ABNORMAL LOW (ref 20.0–28.0)
O2 Saturation: 82.7 %
Patient temperature: 37
pCO2, Ven: 31 mmHg — ABNORMAL LOW (ref 44–60)
pH, Ven: 7.18 — CL (ref 7.25–7.43)
pO2, Ven: 55 mmHg — ABNORMAL HIGH (ref 32–45)

## 2024-02-14 LAB — PROTIME-INR
INR: 3.2 — ABNORMAL HIGH (ref 0.8–1.2)
INR: 3.4 — ABNORMAL HIGH (ref 0.8–1.2)
Prothrombin Time: 34.2 s — ABNORMAL HIGH (ref 11.4–15.2)
Prothrombin Time: 35.5 s — ABNORMAL HIGH (ref 11.4–15.2)

## 2024-02-14 LAB — MRSA NEXT GEN BY PCR, NASAL: MRSA by PCR Next Gen: NOT DETECTED

## 2024-02-14 LAB — MAGNESIUM: Magnesium: 2.4 mg/dL (ref 1.7–2.4)

## 2024-02-14 LAB — VITAMIN B12: Vitamin B-12: 1040 pg/mL — ABNORMAL HIGH (ref 180–914)

## 2024-02-14 LAB — BPAM RBC
Blood Product Expiration Date: 202508032359
ISSUE DATE / TIME: 202507282215
Unit Type and Rh: 5100

## 2024-02-14 LAB — GLUCOSE, CAPILLARY: Glucose-Capillary: 110 mg/dL — ABNORMAL HIGH (ref 70–99)

## 2024-02-14 LAB — LACTIC ACID, PLASMA
Lactic Acid, Venous: >9 mmol/L (ref 0.5–1.9)
Lactic Acid, Venous: >9 mmol/L (ref 0.5–1.9)

## 2024-02-14 MED ORDER — MIDAZOLAM HCL 2 MG/2ML IJ SOLN: 1.0000 mg | INTRAMUSCULAR | Status: DC | PRN

## 2024-02-14 MED ORDER — SODIUM BICARBONATE 8.4 % IV SOLN
INTRAVENOUS | Status: DC
  Filled 2024-02-14: qty 150
  Filled 2024-02-14 (×2): qty 1000

## 2024-02-14 MED ORDER — DOCUSATE SODIUM 100 MG PO CAPS: 100.0000 mg | ORAL_CAPSULE | Freq: Two times a day (BID) | ORAL | Status: DC | PRN

## 2024-02-14 MED ORDER — MORPHINE SULFATE (PF) 2 MG/ML IV SOLN
2.0000 mg | INTRAVENOUS | Status: DC | PRN
  Administered 2024-02-14: 2 mg via INTRAVENOUS
  Filled 2024-02-14: qty 1

## 2024-02-14 MED ORDER — SODIUM CHLORIDE 0.9 % IV SOLN: INTRAVENOUS | Status: DC

## 2024-02-14 MED ORDER — SODIUM CHLORIDE 0.9 % IV BOLUS
500.0000 mL | INTRAVENOUS | Status: AC
  Administered 2024-02-14: 500 mL via INTRAVENOUS

## 2024-02-14 MED ORDER — ACETAMINOPHEN 650 MG RE SUPP: 650.0000 mg | Freq: Four times a day (QID) | RECTAL | Status: DC | PRN

## 2024-02-14 MED ORDER — CHLORHEXIDINE GLUCONATE CLOTH 2 % EX PADS
6.0000 | MEDICATED_PAD | Freq: Every day | CUTANEOUS | Status: DC
  Administered 2024-02-14: 6 via TOPICAL
  Filled 2024-02-14: qty 6

## 2024-02-14 MED ORDER — VASOPRESSIN 20 UNITS/100 ML INFUSION FOR SHOCK
0.0000 [IU]/min | INTRAVENOUS | Status: DC
  Administered 2024-02-14: 0.03 [IU]/min via INTRAVENOUS
  Filled 2024-02-14 (×2): qty 100

## 2024-02-14 MED ORDER — SODIUM BICARBONATE 8.4 % IV SOLN
50.0000 meq | Freq: Once | INTRAVENOUS | Status: AC
  Administered 2024-02-14: 50 meq via INTRAVENOUS
  Filled 2024-02-14: qty 50

## 2024-02-14 MED ORDER — POLYETHYLENE GLYCOL 3350 17 G PO PACK: 17.0000 g | PACK | Freq: Every day | ORAL | Status: DC | PRN

## 2024-02-14 MED ORDER — POLYETHYLENE GLYCOL 3350 17 G PO PACK: 17.0000 g | PACK | Freq: Every day | ORAL | Status: DC

## 2024-02-14 MED ORDER — SODIUM BICARBONATE 8.4 % IV SOLN
50.0000 meq | INTRAVENOUS | Status: AC
  Administered 2024-02-14: 50 meq via INTRAVENOUS
  Filled 2024-02-14: qty 50

## 2024-02-14 MED ORDER — CALCIUM GLUCONATE-NACL 1-0.675 GM/50ML-% IV SOLN
1.0000 g | Freq: Once | INTRAVENOUS | Status: DC
  Administered 2024-02-14: 1000 mg via INTRAVENOUS
  Filled 2024-02-14: qty 50

## 2024-02-14 MED ORDER — NOREPINEPHRINE 4 MG/250ML-% IV SOLN
INTRAVENOUS | Status: AC
  Filled 2024-02-14: qty 250

## 2024-02-14 MED ORDER — ETOMIDATE 2 MG/ML IV SOLN
20.0000 mg | Freq: Once | INTRAVENOUS | Status: AC
  Administered 2024-02-14: 20 mg via INTRAVENOUS
  Filled 2024-02-14: qty 10

## 2024-02-14 MED ORDER — GLYCOPYRROLATE 0.2 MG/ML IJ SOLN: 0.2000 mg | INTRAMUSCULAR | Status: DC | PRN

## 2024-02-14 MED ORDER — SODIUM BICARBONATE 8.4 % IV SOLN
100.0000 meq | Freq: Once | INTRAVENOUS | Status: DC
  Filled 2024-02-14: qty 100

## 2024-02-14 MED ORDER — SODIUM BICARBONATE 8.4 % IV SOLN
50.0000 meq | Freq: Once | INTRAVENOUS | Status: AC
  Administered 2024-02-14: 50 meq via INTRAVENOUS

## 2024-02-14 MED ORDER — FENTANYL CITRATE PF 50 MCG/ML IJ SOSY
50.0000 ug | PREFILLED_SYRINGE | Freq: Once | INTRAMUSCULAR | Status: AC
  Administered 2024-02-14: 50 ug via INTRAVENOUS

## 2024-02-14 MED ORDER — NOREPINEPHRINE 16 MG/250ML-% IV SOLN
0.0000 ug/min | INTRAVENOUS | Status: DC
  Administered 2024-02-14: 10 ug/min via INTRAVENOUS
  Filled 2024-02-14 (×2): qty 250

## 2024-02-14 MED ORDER — POLYVINYL ALCOHOL 1.4 % OP SOLN: 1.0000 [drp] | Freq: Four times a day (QID) | OPHTHALMIC | Status: DC | PRN

## 2024-02-14 MED ORDER — GLYCOPYRROLATE 0.2 MG/ML IJ SOLN
0.2000 mg | INTRAMUSCULAR | Status: DC | PRN
  Administered 2024-02-14: 0.2 mg via INTRAVENOUS
  Filled 2024-02-14: qty 1

## 2024-02-14 MED ORDER — DOCUSATE SODIUM 50 MG/5ML PO LIQD: 100.0000 mg | Freq: Two times a day (BID) | ORAL | Status: DC

## 2024-02-14 MED ORDER — SODIUM BICARBONATE 8.4 % IV SOLN
100.0000 meq | Freq: Once | INTRAVENOUS | Status: AC
  Administered 2024-02-14: 100 meq via INTRAVENOUS

## 2024-02-14 MED ORDER — FENTANYL CITRATE PF 50 MCG/ML IJ SOSY: 25.0000 ug | PREFILLED_SYRINGE | INTRAMUSCULAR | Status: DC | PRN

## 2024-02-14 MED ORDER — NOREPINEPHRINE 4 MG/250ML-% IV SOLN
0.0000 ug/min | INTRAVENOUS | Status: DC
  Administered 2024-02-14: 5 ug/min via INTRAVENOUS
  Administered 2024-02-14: 8 ug/min via INTRAVENOUS

## 2024-02-14 MED ORDER — GLYCOPYRROLATE 1 MG PO TABS: 1.0000 mg | ORAL_TABLET | ORAL | Status: DC | PRN

## 2024-02-14 MED ORDER — ROCURONIUM BROMIDE 10 MG/ML (PF) SYRINGE
1.0000 mg/kg | PREFILLED_SYRINGE | Freq: Once | INTRAVENOUS | Status: DC
  Filled 2024-02-14: qty 10

## 2024-02-14 MED ORDER — ACETAMINOPHEN 325 MG PO TABS: 650.0000 mg | ORAL_TABLET | Freq: Four times a day (QID) | ORAL | Status: DC | PRN

## 2024-02-14 MED ORDER — EPINEPHRINE HCL 5 MG/250ML IV SOLN IN NS
0.5000 ug/min | INTRAVENOUS | Status: DC
  Administered 2024-02-14: 0.5 ug/min via INTRAVENOUS
  Filled 2024-02-14: qty 250

## 2024-02-14 MED ORDER — FENTANYL CITRATE PF 50 MCG/ML IJ SOSY
100.0000 ug | PREFILLED_SYRINGE | Freq: Once | INTRAMUSCULAR | Status: AC
  Administered 2024-02-14: 50 ug via INTRAVENOUS
  Filled 2024-02-14: qty 2

## 2024-02-14 MED ORDER — HYDROCORTISONE SOD SUC (PF) 100 MG IJ SOLR: 100.0000 mg | Freq: Two times a day (BID) | INTRAMUSCULAR | Status: DC

## 2024-02-17 NOTE — Progress Notes (Signed)
 Pt extubated to comfort care.

## 2024-02-17 NOTE — ED Notes (Signed)
 Hilma, MD notified of VBG results

## 2024-02-17 NOTE — Progress Notes (Signed)
 Pt transported to CT then to ICU 7 on the vent without incident. Pt remains on the vent and is tol well at this time. Report given to ICU RT.

## 2024-02-17 NOTE — ED Notes (Signed)
 RT called to notify VBG is otw to lab.

## 2024-02-17 NOTE — Progress Notes (Addendum)
 eLink Physician-Brief Progress Note Patient Name: Hannah Hooper DOB: September 21, 1931 MRN: [id number]   Date of Service  02/23/2024  HPI/Events of Note  89 year old female with history of hypertension dyslipidemia and heart failure secondary to ischemic cardiomyopathy, chronic anticoagulation who presents with rectal bleeding, fall, syncope that eventually went into multiorgan dysfunction  Patient is tachypneic, bradycardic, and hypoxic on peripheral vasopressors.  Patient has severe metabolic acidosis with anion gap.  Shock liver.  Elevated creatinine.  Profoundly elevated lactic acidosis.  eICU Interventions  I discussed the case with the primary team and then called the patient's son Camellia.  The patient is rapidly escalating in terms of pressor requirements and life support.  Given the rapid escalation, I fear that [name] may pass away regardless of maximal support that can be offered.  She is not currently a candidate for CRRT given her hemodynamics.  I expressed this to Camellia who felt that it would be most appropriate to change her CODE STATUS to DNR.  Camellia is going to call his brother and hopefully try to make it and to the hospital ASAP.  Increase bicarbonate infusion and intermittent push  Continue intermittent calcium   Supportive care until family can arrive at bedside, CODE STATUS updated to DNR.  Recommend comfort care upon family arrival     Intervention Category Evaluation Type: New Patient Evaluation  Akisha Sturgill 2024-02-23, 5:18 AM

## 2024-02-17 NOTE — Progress Notes (Signed)
 Family updated by provider, chaplin notified that the patients family is on their way up to see the patient.

## 2024-02-17 NOTE — Procedures (Signed)
 Intubation Procedure Note  Hannah Hooper  [id number]  [birth year]  Date:03-13-24  Time:4:04 AM   Provider Performing:Shalini Mair L Rust-Chester    Procedure: Intubation (31500)  Indication(s) Respiratory Failure  Consent Risks of the procedure as well as the alternatives and risks of each were explained to the patient and/or caregiver.  Consent for the procedure was obtained and is signed in the bedside chart   Anesthesia Etomidate  and Fentanyl    Time Out Verified patient identification, verified procedure, site/side was marked, verified correct patient position, special equipment/implants available, medications/allergies/relevant history reviewed, required imaging and test results available.   Sterile Technique Usual hand hygeine, masks, and gloves were used   Procedure Description Patient positioned in bed supine.  Sedation given as noted above.  Patient was intubated with endotracheal tube using Glidescope.  View was Grade 2 only posterior commissure .  Number of attempts was 1.  Colorimetric CO2 detector was consistent with tracheal placement.   Complications/Tolerance None; patient tolerated the procedure well. Chest X-ray is ordered to verify placement.   EBL Minimal   Specimen(s) None   Hannah Hooper, AGACNP-BC Acute Care Nurse Practitioner [facility]   [phone] / [phone] Please see Amion for details.

## 2024-02-17 NOTE — ED Notes (Addendum)
 7.5/23 at the lip

## 2024-02-17 NOTE — ED Notes (Signed)
50 mcg fentanyl

## 2024-02-17 NOTE — Consult Note (Addendum)
 NAME:  Hannah Hooper, MRN:  [id number], DOB:  1931-12-23, LOS: 1 ADMISSION DATE:  02/13/2024, CONSULTATION DATE:  02/17/24 REFERRING MD:  Dr. Hilma, CHIEF COMPLAINT: Fall    History of Present Illness:  89 yo F presenting to [facility] ED from home on 02/13/24 for evaluation after an unwitnessed fall, vomiting and syncope.  History obtained per chart review as patient is unable to participate in interview at this time. Patient reported intermittent rectal bleeding with hematochezia for almost one week. She denied vision changes, tinnitus, difficulty speaking, facial droop, dysuria, weakness/numbness/paresthesias abdominal pain, diarrhea, fever/chills, chest pain, cough, headache/ neck pain or dyspnea. She had an unwitnessed fall on 7/28 around 22:00, hitting the right lower back of her head. After this fall she had 2 episodes of non-bloody bilious emesis and then an episode of syncope around midnight where she was caught by a family member and did not hit her head. Last dose of Eliquis  was AM 7/28. Hemoglobin dropped from 12.7 on 12/31/23 to 8.0 then 7.3 and BP was soft in the 90's with bradycardia. Gi was consulted and 1 unit of pRBC's was transfused with improvement in BP and Hgb to > 9.  ED course: Upon arrival patient alert and oriented with stable vitals. Labs significant for shock liver, elevated but flat troponin, elevated BNP, AKI, symptomatic anemia. CTH negative. EDP spoke with GI on call who recommended admission, hepatitis testing and LFT trend. [facility] consulted for admission.  Medications given: midodrine , protonix , Andexxa , vitamin K , 1 L NS bolus, zofran , 1 unit RBC's Initial Vitals: 97.9, 17, 62, 120/44 & 98% on RA Significant labs: (Labs/ Imaging personally reviewed) I, Jenita Ruth Rust-Chester, AGACNP-BC, personally viewed and interpreted this ECG. EKG Interpretation: Date: 02/13/24, EKG Time: 21:48, Rate: 47, Rhythm: Junctional, QRS Axis:  normal, Intervals: normal, ST/T Wave abnormalities:  anterolateral T wave inversions, Narrative Interpretation: Junctional rhythm with possible anterolateral ischemia Chemistry: Na+: 139, K+: 4.6, BUN/Cr.: 47/1.75, Serum CO2/ AG: 18/16, AST/ ALT: 933/ 885 Hematology: WBC: 9.1 > 16.9, Hgb: 8.0 > 7.3 > 9.7 Troponin: 65 > 77 > 85 > 113, BNP: 860.8, Lactic: pending,  VBG: 7.18/ 31/ 55/ 11.6 CXR 2024/02/17: pending CT head wo contrast 7/28 & 2024/02/17: no acute intracranial abnormality CT cervical spine wo contrast 02-17-2024: no acute bony abnormality US  abdomen RUQ 02/13/24: No gallstones or biliary ductal dilatation. 2. 9 mm gallbladder polyp. The thick stalk makes this a low risk polyp. Low risk polyps of this size warrant follow up ultrasound at 12 months based on the Society of radiologists and ultrasound 2021 guidelines. Accordingly, follow up hepatobiliary ultrasound is recommended in 1 years time.    After [facility] admission, patient had a rapid neurological deterioration prompting a repeat CTH and VBG. Nursing stated patient was slurring her words and altered. Upon PCCM bedside assessment, patient with sonorous respirations and a RASS: -5. VBG shows a metabolic acidosis, repeat BMP pending. Decision made to emergently intubate the patient and place her on mechanical ventilatory support due to acute encephalopathy s/t acute CVA vs metabolic encephalopathy. Son called & updated, FULL code confirmed. Patient intubated.  PCCM consulted for admission due to acute encephalopathy s/t acute CVA vs metabolic requiring intubation for airway protection and mechanical ventilatory support.  Pertinent  Medical History  Atrial Fibrillation on Eliquis  CKD Stage 3a Diverticulosis Carotid Artery Disease HLD HTN HFpEF CAD NSTEMI Anemia Significant Hospital Events: Including procedures, antibiotic start and stop dates in addition to other pertinent events   2024-02-17: Admit to ICU  due to acute encephalopathy s/t acute CVA vs metabolic requiring intubation for airway  protection and mechanical ventilatory support.  Interim History / Subjective:  Tele neuro consulted post intubation for possible CODE STROKE while BMP pending. CVC emergently placed due to significant circulatory shock on maximum vasopressor support.  Objective    Blood pressure (!) 65/29, pulse (!) 56, temperature (!) 97.4 F (36.3 C), temperature source Oral, resp. rate 15, height 5' (1.524 m), weight 52.2 kg, SpO2 100%.    Vent Mode: PRVC FiO2 (%):  [40 %] 40 % Set Rate:  [18 bmp] 18 bmp Vt Set:  [380 mL-420 mL] 380 mL PEEP:  [5 cmH20] 5 cmH20   Intake/Output Summary (Last 24 hours) at 07-Mar-2024 0405 Last data filed at 02/13/2024 2249 Gross per 24 hour  Intake 1315 ml  Output --  Net 1315 ml   Filed Weights   02/13/24 1152  Weight: 52.2 kg    Examination: General: Adult female, critically ill, lying in bed unresponsive with sonorous respirations requiring emergent intubation and mechanical ventilation,  NAD HEENT: MM pink/moist, anicteric, atraumatic, neck supple Neuro: RASS: -4, unable to follow commands, weak withdrawal to pain in all extremities L pupil oblong and non reactive, R pupil round and non reactive CV: s1s2 irregular with PAC's, junctional on monitor, no r/m/g Pulm: Regular, non labored on 4 L [place], breath sounds coarse-BUL & diminished-BLL GI: soft, rounded, bs x 4 Skin:  no rashes/lesions noted Extremities: warm/dry, pulses + 2 R/P, no edema noted  Resolved problem list   Assessment and Plan  Acute Hypoxic / Hypercapnic Respiratory Failure secondary to acute encephalopathy in the setting of AKI vs acute CVA - Ventilator settings: PRVC  8 mL/kg, 40% FiO2, 5 PEEP, continue ventilator support & lung protective strategies - Wean PEEP & FiO2 as tolerated, maintain SpO2 > 90% - Head of bed elevated 30 degrees, VAP protocol in place - Plateau pressures less than 30 cm H20  - Intermittent chest x-ray & ABG PRN - Daily WUA with SBT as tolerated  - Ensure  adequate pulmonary hygiene  - monitor for possible Aspiration  - PAD protocol in place: continue Fentanyl  IVP & Versed  IVP  Acute Encephalopathy s/t CVA vs metabolic disturbances Due to RN description of sudden mental status change with slurred speech- CODE STROKE called while waiting for BMP results. BMP once resulted showed worsening AKI, metabolic acidosis and shock liver. Tele-neurology updated. Decision to hold on further emergent imaging overnight. - supportive care - consider MRI/ MRA if needed once stabilized  Acute Kidney Injury superimposed on CKD Stage 3a - worsening Severe AGMA Baseline Cr: 1.02, Cr on admission:1.75 > now 2.34 - sodium bicarb supplementation, trend vbg - Strict I/O's: alert provider if UOP < 0.5 mL/kg/hr - gentle IVF hydration  - Daily BMP, replace electrolytes PRN - Avoid nephrotoxic agents as able, ensure adequate renal perfusion - Consider nephrology consultation    Severe Lactic Acidosis in the setting of shock liver Circulatory Shock secondary to sepsis vs hypovolemia? No episodes of bleeding since arrival to ED. Worsening multi-organ failure, AKI, and shock liver. No clear source of infection and no infectious symptoms initially. - STAT CT abdomen/pelvis - trend lactic - 1 L IVF bolus ordered - levophed  started, titrate PRN to maintain MAP > 65. Add vasopressin  PRN - consider empiric antibiotic initiation  Acute Lower Gastrointestinal Bleed in the setting of chronic Eliquis  PMHx: a-fib on eliquis  Andexxa  administered - continue Protonix  bid - Maintain up to date type &  screen - continuous cardiac monitoring - NPO - Monitor for s/s of bleeding - Daily CBC, PT/ INR monitoring PRN - Transfuse for Hgb <7 - GI consulted, appreciate input  Shock liver - worsening - Trend hepatic function - Consider CT abdomen/pelvis wo contrast once stable - avoid hepatotoxic agents - GI consulted, appreciate input  Chronic / Paroxysmal Atrial Fibrillation   Chronic HFpEF HLD PMHx: HTN, CAD - Continuous cardiac monitoring - eliquis  reversed due to GIB, hold ASA - hold amiodarone  due to bradycardia - hold anti-hypertensive medications due to hypotension - hold statin due to shock liver  Best Practice (right click and Reselect all SmartList Selections daily)   Diet/type: NPO DVT prophylaxis SCD Pressure ulcer(s): N/A GI prophylaxis: PPI Lines: N/A- will need CVC Foley:  N/A- will need foley Code Status:  full code Last date of multidisciplinary goals of care discussion [February 20, 2024] Discussed situation with son, Camellia who confirmed FULL code status Labs   CBC: Recent Labs  Lab 02/13/24 1158 02/13/24 1952 February 20, 2024 0053  WBC 9.1 10.3 16.9*  NEUTROABS 7.9*  --   --   HGB 8.0* 7.3* 9.7*  HCT 26.6* 23.4* 30.7*  MCV 92.0 91.4 90.8  PLT 256 238 235    Basic Metabolic Panel: Recent Labs  Lab 02/13/24 1158 2024/02/20 0238  NA 139 137  K 4.6 4.7  CL 105 109  CO2 18* 9*  GLUCOSE 159* 124*  BUN 47* 59*  CREATININE 1.75* 2.34*  CALCIUM  8.5* 7.6*   GFR: Estimated Creatinine Clearance: 11.5 mL/min (A) (by C-G formula based on SCr of 2.34 mg/dL (H)). Recent Labs  Lab 02/13/24 1158 02/13/24 1952 02-20-2024 0053 02/20/24 0238  WBC 9.1 10.3 16.9*  --   LATICACIDVEN  --   --   --  >9.0*    Liver Function Tests: Recent Labs  Lab 02/13/24 1158 Feb 20, 2024 0238  AST 933* 1,969*  ALT 885* 2,027*  ALKPHOS 105 86  BILITOT 0.8 1.9*  PROT 6.0* 5.8*  ALBUMIN 3.4* 3.2*   No results for input(s): LIPASE, AMYLASE in the last 168 hours. Recent Labs  Lab 02/13/24 1952  AMMONIA 14    ABG    Component Value Date/Time   PHART 7.399 04/11/2023 0923   PCO2ART 42.7 04/11/2023 0923   PO2ART 61 (L) 04/11/2023 0923   HCO3 11.6 (L) 02/20/24 0053   TCO2 30 04/11/2023 0925   ACIDBASEDEF 15.5 (H) Feb 20, 2024 0053   O2SAT 82.7 Feb 20, 2024 0053     Coagulation Profile: Recent Labs  Lab 02/13/24 1158  INR 1.5*    Cardiac  Enzymes: No results for input(s): CKTOTAL, CKMB, CKMBINDEX, TROPONINI in the last 168 hours.  HbA1C: No results found for: HGBA1C  CBG: No results for input(s): GLUCAP in the last 168 hours.  Review of Systems:   UTA- patient unresponsive  Past Medical History:  She,  has a past medical history of Benign neoplasm of colon, unspecified, Carotid artery disease (HCC), Diverticulosis, Hyperlipemia, Hypertension, Hypertensive kidney disease with CKD stage III (HCC), Lesion of ulnar nerve, Osteoarthritis, and Valvular heart disease.   Surgical History:   Past Surgical History:  Procedure Laterality Date   bunions  1990   CARPOMETACARPAL [facility]) FUSION OF THUMB Left 06/24/2016   Procedure: CARPOMETACARPAL [facility]) ARthroplasty OF THUMB;  Surgeon: Ozell Flake, MD;  Location: [facility] ORS;  Service: Orthopedics;  Laterality: Left;   CATARACT EXTRACTION Bilateral    EYE SURGERY Bilateral 2014   cataract surgery   GAS/FLUID EXCHANGE Left 03/02/2018   Procedure:  GAS/FLUID EXCHANGE;  Surgeon: Valdemar Rogue, MD;  Location: [facility] OR;  Service: Ophthalmology;  Laterality: Left;   MEMBRANE PEEL Left 03/02/2018   Procedure: MEMBRANE PEEL;  Surgeon: Valdemar Rogue, MD;  Location: [facility] OR;  Service: Ophthalmology;  Laterality: Left;   PARS PLANA VITRECTOMY Left 03/02/2018   Procedure: PARS PLANA VITRECTOMY WITH 25 GAUGE;  Surgeon: Valdemar Rogue, MD;  Location: [facility] OR;  Service: Ophthalmology;  Laterality: Left;   RIGHT/LEFT HEART CATH AND CORONARY ANGIOGRAPHY N/A 04/11/2023   Procedure: RIGHT/LEFT HEART CATH AND CORONARY ANGIOGRAPHY;  Surgeon: Darron Deatrice LABOR, MD;  Location: [facility] INVASIVE CV LAB;  Service: Cardiovascular;  Laterality: N/A;   SHOULDER ARTHROSCOPY Right 2014   TONSILLECTOMY     as a child   ULNAR NERVE TRANSPOSITION Right      Social History:   reports that she has never smoked. She has never used smokeless tobacco. She reports that she does not currently use alcohol . She reports that  she does not use drugs.   Family History:  Her family history includes Breast cancer (age of onset: 51) in her mother.   Allergies No Known Allergies   Home Medications  Prior to Admission medications   Medication Sig Start Date End Date Taking? Authorizing Provider  amiodarone  (PACERONE ) 200 MG tablet Take 1 tablet (200 mg total) by mouth 2 (two) times daily. For 2 weeks and then start taking once a day 01/01/24  Yes Caleen Qualia, MD  amLODipine  (NORVASC ) 5 MG tablet Take 1 tablet (5 mg total) by mouth daily. 04/12/23  Yes Darci Pore, MD  apixaban  (ELIQUIS ) 2.5 MG TABS tablet Take 1 tablet (2.5 mg total) by mouth 2 (two) times daily. 04/12/23  Yes Darci Pore, MD  aspirin  81 MG chewable tablet Chew 1 tablet (81 mg total) by mouth daily. 04/13/23  Yes Darci Pore, MD  atorvastatin  (LIPITOR ) 40 MG tablet Take 1 tablet (40 mg total) by mouth daily. Patient not taking: Reported on 02/13/2024 04/13/23   Darci Pore, MD  cyanocobalamin (VITAMIN B12) 1000 MCG tablet Take 1 tablet (1,000 mcg total) by mouth daily. Patient not taking: Reported on 02/13/2024 04/12/23   Darci Pore, MD  nitroGLYCERIN  (NITROSTAT ) 0.4 MG SL tablet PLACE ONE TABLET UNDER TONGUE AS NEEDED FOR CHEST PAIN EVERY 5 MINUTES Patient not taking: Reported on 02/13/2024 07/25/23   Loistine Sober, NP  sulfaSALAzine  (AZULFIDINE ) 500 MG tablet Take 2 tablets (1,000 mg total) by mouth daily. Patient not taking: Reported on 02/13/2024 04/12/23   Darci Pore, MD     Critical care time: 70 minutes        Jenita Jama Meek, AGACNP-BC Acute Care Nurse Practitioner [facility]   [phone] / [phone] Please see Amion for details.

## 2024-02-17 NOTE — ED Notes (Signed)
 Code stroke button pressed

## 2024-02-17 NOTE — Progress Notes (Signed)
 PHARMACY CONSULT NOTE - FOLLOW UP  Pharmacy Consult for Electrolyte Monitoring and Replacement   Recent Labs: Potassium (mmol/L)  Date Value  02-28-2024 4.7   Magnesium  (mg/dL)  Date Value  93/85/7974 2.1   Calcium  (mg/dL)  Date Value  92/70/7974 7.6 (L)   Albumin (g/dL)  Date Value  92/70/7974 3.2 (L)   Phosphorus (mg/dL)  Date Value  93/85/7974 3.4   Sodium (mmol/L)  Date Value  February 28, 2024 137     Assessment: 02/28/24 @ 0238:  Ca = 7.6,  Alb = 3.2,  Corrected Ca = 8.24  Goal of Therapy:  Electrolytes WNL   Plan:  Will order Calcium  gluconate 1 gm IVPB X 1 and recheck electrolytes on 7/30 with AM labs.   Silver Selinda BIRCH ,PharmD Clinical Pharmacist Feb 28, 2024 3:44 AM

## 2024-02-17 NOTE — Progress Notes (Addendum)
 Family is here at bedside and has decided to transition the patient into comfort care. RT notified for one way extubation.

## 2024-02-17 NOTE — ED Provider Notes (Signed)
-----------------------------------------   12:32 AM on 03/14/24 -----------------------------------------  I was alerted by the patient's nurse that she had an acute mental status change, now somnolent and slurring her words with intermittent sonorous respirations.  I responded and assessed the patient, having not seen her before.  She is responding to my questions but has very slurred speech and is only intermittently responding to commands.  She is protecting her airway currently and does not require emergent intubation.  Given that she is on a blood thinner and receiving reversal agents as well as blood products, I am concerned about the possibility of acute intracranial bleed versus CVA and I ordered an emergent head CT and contacted the CT technologist and asked for the patient to be immediately scanned.  I encouraged the patient's nurse, Dorian, to contact the patient's hospitalist, Dr. Hilma, to inform him of the mental status and clinical change.  I will be nearby should the patient decompensate and require intubation.   Gordan Huxley, MD 03-14-2024 [phone]

## 2024-02-17 NOTE — Progress Notes (Signed)
 Patient admitted to ICU07 with GI Bleed. Patient is intubated and sedated with sodium bicarb, levophed  and vasopressin  infusing per orders. NP at bedside, orders received.

## 2024-02-17 NOTE — Consult Note (Signed)
 [facility] Tele[facility]urology Consult Services   Patient Name:   Hannah Hooper, Hannah Hooper Date of Birth:   1932/01/31 Identification Number:   MRN - [id number] Date of Service:   Feb 22, 2024 03:12:03  Diagnosis:       G93.49 - Encephalopathy Multifactorial  Impression:      Patient is a 89 year old lady past medical history significant for hypertension, hyperlipidemia, A-fib on Eliquis  who was admitted with rectal bleeding.    - Treat Underlying Infection, severe electrolyte, acid base, endocrine or circulatory disturbances  - Avoid sedating medication  - Core temp > 36 degrees celsius  - MAP 65 - 70  - Institute appropriate supportive measures  - Adequate hydration and oxygenation, minimize physical restraints  - Orienting stimuli to avoid day and night confusion  - If symptoms do no resolve consider MRI brain    Our recommendations are outlined below.  Recommendations:        Stroke/Telemetry Floor       Neuro Checks (Q2)       Bedside Swallow Eval       DVT Prophylaxis       IV Fluids, Normal Saline       Head of Bed 30 Degrees       Euglycemia and Avoid Hyperthermia (PRN Acetaminophen )  Sign Out:       Discussed with Primary Attending    ------------------------------------------------------------------------------  Advanced Imaging: Advanced Imaging Deferred because:  Stroke not suspected with clinical presentation and exam   Metrics: Last Known Well: Unknown Dispatch Time: February 22, 2024 03:12:03 Initial Response Time: 02-22-24 03:25:03 Symptoms: slurred speech. Initial patient interaction: 2024/02/22 03:28:38 NIHSS Assessment Completed: 02-22-2024 03:44:09 Patient is not a candidate for Thrombolytic. Thrombolytic Medical Decision: 02-22-24 03:44:14 Patient was not deemed candidate for Thrombolytic because of following reasons: Recent gastrointestinal or urinary tract hemorrhage (within previous 21 days) .  CT Head: CT head unremarkable for acute  infarction or hemorrhage per Radiology: ---------------------------------  Primary Provider Notified of Diagnostic Impression and Management Plan on: February 22, 2024 03:40:46 Spoke With: Rest-Thester Able to Reach February 22, 2024 03:40:46    ------------------------------------------------------------------------------  History of Present Illness: Patient is a 89 year old Female.  Inpatient stroke alert was called for symptoms of slurred speech. Patient is a 89 year old lady past medical history significant for hypertension, hyperlipidemia, A-fib on Eliquis  who was admitted with rectal bleeding. She was treated with adnexa The ED admitted for further management. Patient had to be intubated for airway protection. Patient now has multiorgan failure. Neurology consulted for slurred speech. CT head was done which was negative.      Past Medical History:      Hypertension      Diabetes Mellitus      Hyperlipidemia      Atrial Fibrillation  Medications:  Anticoagulant use:  Yes Elquis No Antiplatelet use Reviewed EMR for current medications  Allergies:  Reviewed  Social History: Unable To Obtain Due To Patient Status : Patient Is Obtunded/ Comatose  Family History:  There is no family history of premature cerebrovascular disease pertinent to this consultation  ROS : 14 Points Review of Systems was performed and was negative except mentioned in HPI.  Past Surgical History: There Is No Surgical History Contributory To Today's Visit     Examination: BP(71/46), Pulse(51), 1A: Level of Consciousness - Postures or Unresponsive + 3 1B: Ask Month and Age - Could Not Answer Either Question Correctly + 2 1C: Blink Eyes & Squeeze Hands - Performs 0 Tasks + 2 2: Test Horizontal Extraocular Movements -  Normal + 0 3: Test Visual Fields - No Visual Loss + 0 4: Test Facial Palsy (Use Grimace if Obtunded) - Normal symmetry + 0 5A: Test Left Arm Motor Drift - No Movement + 4 5B: Test Right  Arm Motor Drift - No Movement + 4 6A: Test Left Leg Motor Drift - No Movement + 4 6B: Test Right Leg Motor Drift - No Movement + 4 7: Test Limb Ataxia (FNF/Heel-Shin) - Paralyzed + 0 8: Test Sensation - Normal; No sensory loss + 0 9: Test Language/Aphasia - Coma/Unresponsive + 3 10: Test Dysarthria - Intubated/Unable to Test + 0 11: Test Extinction/Inattention - No abnormality + 0  NIHSS Score: 26   Pre-Morbid Modified Rankin Scale: Unable to assess  Spoke with : Rest-Thester I reviewed the available imaging via Rapid and initiated discussion with the primary provider  This consult was conducted in real time using interactive audio and Immunologist. Patient was informed of the technology being used for this visit and agreed to proceed. Patient located in hospital and provider located at home/office setting.   Patient is being evaluated for possible acute neurologic impairment and high probability of imminent or life-threatening deterioration. I spent total of 37 minutes providing care to this patient, including time for face to face visit via telemedicine, review of medical records, imaging studies and discussion of findings with providers, the patient and/or family.   Dr Marzetta Lemmings   [facility] For Inpatient follow-up with [facility] physician please call [facility] at [phone]. As we are not an outpatient service for any post hospital discharge needs please contact the hospital for assistance. If you have any questions for the [facility] physicians or need to reconsult for clinical or diagnostic changes please contact us  via [facility] at [phone].

## 2024-02-17 NOTE — H&P (Incomplete)
 History and Physical    BEDA DULA FMW:[id number] DOB: Feb 18, 1932 DOA: 02/13/2024  Referring MD/NP/PA:   PCP: Lenon Layman ORN, MD   Patient coming from:  The patient is coming from home.     Chief Complaint: Rectal bleeding, fall, syncope  HPI: Hannah Hooper is a 89 y.o. female with medical history significant of hypertension, hyperlipidemia, CAD, non-STEMI,dCHF, CKD 3A, A-fib on Eliquis , diverticulosis, anemia, who presents with rectal bleeding, fall, syncope.  Patient states that she has intermittent rectal bleeding with bright red blood for almost 1 week.  She has nausea vomited twice today, no abdominal pain or diarrhea.  No fever or chills.  Patient does not have chest pain, cough, SOB.  Per report, patient had a witnessed fall and passed out last night at about 10 PM. She refused transportation last night. She complains of upper back pain.  No headache or neck pain.  No symptoms of UTI.  She took last dose Eliquis  in AM of 7/28.  Patient was found to have hemoglobin dropped from 12.7 on 12/31/2023 --> 8.0 --> then 7.3.  Her blood pressure has been running low to 91/43 (58) in ED. She has bradycardia with heart rate 45-60s.  Data reviewed independently and ED Course: pt was found to have WBC 9.1, BNP 860.8, worsening renal function with creatinine 1.75, BUN 47 and EGFR 27 (recent baseline creatinine 1.02 on 01/01/2024), troponin 65 --> 77, INR 1.5, INR 33, abnormal liver function (ALP 105, AST 973, ALT 885, total bilirubin 0.8), Tylenol  level less than 10, ammonia level 14.  Temperature normal, RR 15, oxygen saturation 98% on room air.  CT of head negative for acute intracranial abnormalities.  ED physician consulted Dr. Jinny of GI.   RUQ-US : 1. No gallstones or biliary ductal dilatation. 2. 9 mm gallbladder polyp. The thick stalk makes this a low risk polyp. Low risk polyps of this size warrant follow up ultrasound at 12 months based on the Society of radiologists and ultrasound  2021 guidelines. Accordingly, follow up hepatobiliary ultrasound is recommended in 1 years time.  EKG: I have personally reviewed.  Seems to be in A-fib, diffuse ST depression.   Review of Systems:   General: no fevers, chills, no body weight gain, has poor appetite, has fatigue HEENT: no blurry vision, hearing changes or sore throat Respiratory: no dyspnea, coughing, wheezing CV: no chest pain, no palpitations GI: has nausea, vomiting, no abdominal pain, diarrhea, constipation, has rectal bleeding GU: no dysuria, burning on urination, increased urinary frequency, hematuria  Ext: has trace leg edema Neuro: no unilateral weakness, numbness, or tingling, no vision change or hearing loss. Has fall and syncope Skin: no rash, no skin tear. MSK: No muscle spasm, no deformity, no limitation of range of movement in spin.   Heme: No easy bruising.  Travel history: No recent long distant travel.   Allergy: No Known Allergies  Past Medical History:  Diagnosis Date  . Benign neoplasm of colon, unspecified   . Carotid artery disease (HCC)   . Diverticulosis   . Hyperlipemia   . Hypertension   . Hypertensive kidney disease with CKD stage III (HCC)   . Lesion of ulnar nerve   . Osteoarthritis   . Valvular heart disease    a. 02/2020 Echo: EF 50%, triv AI, mild AS/MR/MS/TR.    Past Surgical History:  Procedure Laterality Date  . bunions  1990  . CARPOMETACARPAL (CMC) FUSION OF THUMB Left 06/24/2016   Procedure: CARPOMETACARPAL [facility]) ARthroplasty OF  THUMB;  Surgeon: Ozell Flake, MD;  Location: [facility] ORS;  Service: Orthopedics;  Laterality: Left;  . CATARACT EXTRACTION Bilateral   . EYE SURGERY Bilateral 2014   cataract surgery  . GAS/FLUID EXCHANGE Left 03/02/2018   Procedure: GAS/FLUID EXCHANGE;  Surgeon: Valdemar Rogue, MD;  Location: [facility] OR;  Service: Ophthalmology;  Laterality: Left;  . MEMBRANE PEEL Left 03/02/2018   Procedure: MEMBRANE PEEL;  Surgeon: Valdemar Rogue, MD;  Location: [facility]  OR;  Service: Ophthalmology;  Laterality: Left;  . PARS PLANA VITRECTOMY Left 03/02/2018   Procedure: PARS PLANA VITRECTOMY WITH 25 GAUGE;  Surgeon: Valdemar Rogue, MD;  Location: [facility] OR;  Service: Ophthalmology;  Laterality: Left;  . RIGHT/LEFT HEART CATH AND CORONARY ANGIOGRAPHY N/A 04/11/2023   Procedure: RIGHT/LEFT HEART CATH AND CORONARY ANGIOGRAPHY;  Surgeon: Darron Deatrice LABOR, MD;  Location: [facility] INVASIVE CV LAB;  Service: Cardiovascular;  Laterality: N/A;  . SHOULDER ARTHROSCOPY Right 2014  . TONSILLECTOMY     as a child  . ULNAR NERVE TRANSPOSITION Right     Social History:  reports that she has never smoked. She has never used smokeless tobacco. She reports that she does not currently use alcohol . She reports that she does not use drugs.  Family History:  Family History  Problem Relation Age of Onset  . Breast cancer Mother 26     Prior to Admission medications   Medication Sig Start Date End Date Taking? Authorizing Provider  amiodarone  (PACERONE ) 200 MG tablet Take 1 tablet (200 mg total) by mouth 2 (two) times daily. For 2 weeks and then start taking once a day 01/01/24   Amin, Sumayya, MD  amLODipine  (NORVASC ) 5 MG tablet Take 1 tablet (5 mg total) by mouth daily. 04/12/23   Darci Pore, MD  apixaban  (ELIQUIS ) 2.5 MG TABS tablet Take 1 tablet (2.5 mg total) by mouth 2 (two) times daily. 04/12/23   Darci Pore, MD  aspirin  81 MG chewable tablet Chew 1 tablet (81 mg total) by mouth daily. 04/13/23   Darci Pore, MD  atorvastatin  (LIPITOR ) 40 MG tablet Take 1 tablet (40 mg total) by mouth daily. 04/13/23   Darci Pore, MD  cyanocobalamin (VITAMIN B12) 1000 MCG tablet Take 1 tablet (1,000 mcg total) by mouth daily. 04/12/23   Darci Pore, MD  nitroGLYCERIN  (NITROSTAT ) 0.4 MG SL tablet PLACE ONE TABLET UNDER TONGUE AS NEEDED FOR CHEST PAIN EVERY 5 MINUTES 07/25/23   Loistine Sober, NP  sulfaSALAzine  (AZULFIDINE ) 500 MG tablet Take 2  tablets (1,000 mg total) by mouth daily. 04/12/23   Darci Pore, MD    Physical Exam: Vitals:   02/13/24 2245 02/13/24 2315 02/13/24 2330 02/13/24 2345  BP: (!) 87/58 (!) 93/41  (!) 113/43  Pulse: (!) 47 (!) 48 (!) 49 (!) 47  Resp: 20 (!) 21 (!) 24 (!) 21  Temp:      TempSrc:      SpO2: 100% 98% 100% 100%  Weight:      Height:       General: Not in acute distress HEENT:       Eyes: PERRL, EOMI, no jaundice       ENT: No discharge from the ears and nose, no pharynx injection, no tonsillar enlargement.        Neck: No JVD, no bruit, no mass felt. Heme: No neck lymph node enlargement. Cardiac: S1/S2, irregularly irregular rhythm, no gallops or rubs. Respiratory: No rales, wheezing, rhonchi or rubs. GI: Soft, nondistended, nontender, no rebound pain, no organomegaly, BS  present. GU: No hematuria Ext: has trace leg edema bilaterally. 1+DP/PT pulse bilaterally. Musculoskeletal: No joint deformities, No joint redness or warmth, no limitation of ROM in spin. Skin: No rashes.  Neuro: Alert, oriented X3, cranial nerves II-XII grossly intact, moves all extremities normally Psych: Patient is not psychotic, no suicidal or hemocidal ideation.  Labs on Admission: I have personally reviewed following labs and imaging studies  CBC: Recent Labs  Lab 02/13/24 1158 02/13/24 1952  WBC 9.1 10.3  NEUTROABS 7.9*  --   HGB 8.0* 7.3*  HCT 26.6* 23.4*  MCV 92.0 91.4  PLT 256 238   Basic Metabolic Panel: Recent Labs  Lab 02/13/24 1158  NA 139  K 4.6  CL 105  CO2 18*  GLUCOSE 159*  BUN 47*  CREATININE 1.75*  CALCIUM  8.5*   GFR: Estimated Creatinine Clearance: 15.3 mL/min (A) (by C-G formula based on SCr of 1.75 mg/dL (H)). Liver Function Tests: Recent Labs  Lab 02/13/24 1158  AST 933*  ALT 885*  ALKPHOS 105  BILITOT 0.8  PROT 6.0*  ALBUMIN 3.4*   No results for input(s): LIPASE, AMYLASE in the last 168 hours. Recent Labs  Lab 02/13/24 1952  AMMONIA 14    Coagulation Profile: Recent Labs  Lab 02/13/24 1158  INR 1.5*   Cardiac Enzymes: No results for input(s): CKTOTAL, CKMB, CKMBINDEX, TROPONINI in the last 168 hours. BNP (last 3 results) No results for input(s): PROBNP in the last 8760 hours. HbA1C: No results for input(s): HGBA1C in the last 72 hours. CBG: No results for input(s): GLUCAP in the last 168 hours. Lipid Profile: No results for input(s): CHOL, HDL, LDLCALC, TRIG, CHOLHDL, LDLDIRECT in the last 72 hours. Thyroid Function Tests: No results for input(s): TSH, T4TOTAL, FREET4, T3FREE, THYROIDAB in the last 72 hours. Anemia Panel: Recent Labs    02/13/24 1158  FOLATE 20.5  FERRITIN 64  TIBC 403  IRON 20*  RETICCTPCT 4.9*   Urine analysis: No results found for: COLORURINE, APPEARANCEUR, LABSPEC, PHURINE, GLUCOSEU, HGBUR, BILIRUBINUR, KETONESUR, PROTEINUR, UROBILINOGEN, NITRITE, LEUKOCYTESUR Sepsis Labs: @LABRCNTIP (procalcitonin:4,lacticidven:4) )No results found for this or any previous visit (from the past 240 hours).   Radiological Exams on Admission:   Assessment/Plan Principal Problem:   GI bleeding Active Problems:   Acute blood loss anemia   Syncope   Fall at home, initial encounter   CAD (coronary artery disease)   Myocardial injury   HTN (hypertension)   HLD (hyperlipidemia)   Atrial fibrillation, chronic (HCC)   Chronic diastolic CHF (congestive heart failure) (HCC)   Acute renal failure superimposed on stage 3a chronic kidney disease (HCC)   Gallbladder polyp   Assessment and Plan:   GI bleeding and acute blood loss anemia: Hgb 12.7 --> 8.0 --> 7.3.  Patient reports having rectal bleeding with bright red blood, indicating possible lower GI bleeding given history of diverticulosis.  Since patient has worsening renal function with GFR 27, cannot to CT scan-GI bleed. Pt is on Eliquis , last dose was in the morning of 7/28.   EDP  consulted Dr. Jinny of GI.   - will admitted to SDU as inpatient - Will give 1 dose of Andexxa  to reverse Eliquis . - hold Eliquis  and ASA - transfuse 1 unit of blood now - NPO, pending GI consult.  - IVF: 500 mL NS bolus x 2, then at 50 mL/hr - Start IV pantoprazole  40 mg bid - Check anemia panel - Zofran  IV for nausea - Avoid NSAIDs and SQ heparin  - Maintain  IV access (2 large bore IVs if possible). - Monitor closely and follow q6h cbc, transfuse as necessary, if Hgb<7.0  Abnormal liver function: ALP 105, AST 973, ALT 885, total bilirubin 0.8.  Etiology is not clear.  Tylenol  level less than 10.  Hepatitis panel negative. RUQ-US  showed   No gallstones or biliary ductal dilatation. 2. 9 mm gallbladder polyp. The thick stalk makes this a low risk polyp. Low risk polyps of this size warrant follow up ultrasound at 12 months based on the Society of radiologists and ultrasound 2021 guidelines. Accordingly, follow up hepatobiliary ultrasound is recommended in 1 years time.         Syncope   Fall at home, initial encounter   CAD (coronary artery disease)   Myocardial injury   HTN (hypertension)   HLD (hyperlipidemia)   Atrial fibrillation, chronic (HCC)   Chronic diastolic CHF (congestive heart failure) (HCC)   Acute renal failure superimposed on stage 3a chronic kidney disease (HCC)   Gallbladder polyp       DVT ppx: SCD  Code Status: Full code    Family Communication:     not done, no family member is at bed side.       Disposition Plan:  Anticipate discharge back to previous environment  Consults called:  ED physician consulted Dr. Jinny of GI.   Admission status and Level of care: Stepdown:  as inpt        Dispo: The patient is from: Home              Anticipated d/c is to: Home              Anticipated d/c date is: 2 days              Patient currently is not medically stable to d/c.    Severity of Illness:  The appropriate patient status for this  patient is INPATIENT. Inpatient status is judged to be reasonable and necessary in order to provide the required intensity of service to ensure the patient's safety. The patient's presenting symptoms, physical exam findings, and initial radiographic and laboratory data in the context of their chronic comorbidities is felt to place them at high risk for further clinical deterioration. Furthermore, it is not anticipated that the patient will be medically stable for discharge from the hospital within 2 midnights of admission.   * I certify that at the point of admission it is my clinical judgment that the patient will require inpatient hospital care spanning beyond 2 midnights from the point of admission due to high intensity of service, high risk for further deterioration and high frequency of surveillance required.*       Date of Service 02/13/2024    Caleb Exon [facility]   If 7PM, 7AM, please contact night-coverage [email or web address] 02/13/2024, 11:54 PM

## 2024-02-17 NOTE — Procedures (Signed)
 Central Venous Catheter Insertion Procedure Note  TINLEE NAVARRETTE  [id number]  [birth year]  Date:Mar 03, 2024  Time:4:03 AM   Provider Performing:Geraldyn Shain L [place]   Procedure: Insertion of Non-tunneled Central Venous Catheter(36556) with US  guidance (23062)   Indication(s) Medication administration  Consent Unable to obtain consent due to emergent nature of procedure.  Anesthesia Topical only with 1% lidocaine    Timeout Verified patient identification, verified procedure, site/side was marked, verified correct patient position, special equipment/implants available, medications/allergies/relevant history reviewed, required imaging and test results available.  Sterile Technique Maximal sterile technique including full sterile barrier drape, hand hygiene, sterile gown, sterile gloves, mask, hair covering, sterile ultrasound probe cover (if used).  Procedure Description Area of catheter insertion was cleaned with chlorhexidine  and draped in sterile fashion.  With real-time ultrasound guidance a central venous catheter was placed into the right femoral vein. Nonpulsatile blood flow and easy flushing noted in all ports.  The catheter was sutured in place and sterile dressing applied.  Complications/Tolerance None; patient tolerated the procedure well. Chest X-ray is ordered to verify placement for internal jugular or subclavian cannulation.   Chest x-ray is not ordered for femoral cannulation.  EBL Minimal  Specimen(s) None  Jenita Jama Meek, AGACNP-BC Acute Care Nurse Practitioner [facility]   [phone] / [phone] Please see Amion for details.

## 2024-02-17 NOTE — IPAL (Signed)
  Interdisciplinary Goals of Care Family Meeting   Date carried out: 03/01/24  Location of the meeting: Bedside  Member's involved: Nurse Practitioner, Bedside Registered Nurse, Chaplain, and Family Member or next of kin  Durable Power of Attorney or acting medical decision maker: Camellia and Dorise the patient's sons are bedside  Discussion: We discussed goals of care for Hannah Hooper who is in severe multi-organ failure on multiple vasopressors and is minimally responsive on no sedatives. The patient is in the active dying process. Family is aware, and has decided to transition to focusing on comfort.    Code status:   Code Status: Do not attempt resuscitation (DNR) - Comfort care   Disposition: In-patient comfort care  Time spent for the meeting: 20 minutes    Jenita Jama Meek, AGACNP-BC Acute Care Nurse Practitioner [facility]   [phone] / [phone] Please see Amion for details.

## 2024-02-17 NOTE — ED Notes (Addendum)
 Pt yelling out. This RN in room to address and end blood transfusion. Pt having new onset of slurred speech with delayed responsiveness. Able to answer orientation questions with increased stimulation and repetition. Hilma, MD paged and Gordan, MD in room.

## 2024-02-17 NOTE — ED Notes (Signed)
 Lt grn blood tube sent to lab

## 2024-02-17 NOTE — ED Notes (Addendum)
 Pt belongings cut off pt during intubation - placed in belongings bag and taken with pt to ICU. Shirt, bra, shorts, underwear, and shoes.

## 2024-02-17 NOTE — ED Notes (Addendum)
 Hilma, MD at bedside. Pt not responsive to painful stimuli. Snoring respirations,

## 2024-02-17 NOTE — Death Summary Note (Signed)
 DEATH SUMMARY   Patient Details  Name: Hannah Hooper MRN: [id number] DOB: 25-Feb-1932  Admission/Discharge Information   Admit Date:  Mar 05, 2024  Date of Death: Date of Death: March 06, 2024  Time of Death: Time of Death: 0730  Length of Stay: 1  Referring Physician: Lenon Layman ORN, MD   Reason(s) for Hospitalization  Cardiac Arrest from severe septic shock and Acute lower GI bleed.   Diagnoses  Preliminary cause of death:  Secondary Diagnoses (including complications and co-morbidities):  Principal Problem:   GI bleeding Active Problems:   Syncope   Acute blood loss anemia   Fall at home, initial encounter   HTN (hypertension)   HLD (hyperlipidemia)   CAD (coronary artery disease)   Myocardial injury   Atrial fibrillation, chronic (HCC)   Chronic diastolic CHF (congestive heart failure) (HCC)   Acute renal failure superimposed on stage 3a chronic kidney disease (HCC)   Gallbladder polyp   GIB (gastrointestinal bleeding)   Symptomatic anemia   Shock circulatory (HCC)   Acute metabolic encephalopathy   Brief Hospital Course (including significant findings, care, treatment, and services provided and events leading to death)  Hannah Hooper is a 89 y.o. year old female who presented to [facility] ED from home on 2024-03-05 for evaluation after an unwitnessed fall, vomiting and syncope. She was foind to be I'm mixed septic and hemorrhagic shock. Emergently intubated for acute encephalopathy and airway protection on 03/06/2024. Unfortunately course complicated by refractory shock requiring addition of 3rd pressor. Given tenuous course multiple comorbidities including age, a family meeting was done urgently and patient was made CMO. She was terminally extubated on 2024/03/06 and passed away peacefully on 03/06/2024 at 07:30 am.   Pertinent Labs and Studies  Significant Diagnostic Studies CT ABDOMEN PELVIS WO CONTRAST Result Date: Mar 06, 2024 EXAM: CT ABDOMEN AND PELVIS WITHOUT CONTRAST 2024/03/06  04:59:20 AM TECHNIQUE: CT of the abdomen and pelvis was performed without the administration of intravenous contrast. Multiplanar reformatted images are provided for review. Automated exposure control, iterative reconstruction, and/or weight based adjustment of the mA/kV was utilized to reduce the radiation dose to as low as reasonably achievable. COMPARISON: CTA 04/22/2023 CLINICAL HISTORY: Lower GI bleed; suspected undifferentiated septic shock. FINDINGS: LOWER CHEST: Small bilateral pleural effusions with overlying compressive atelectasis. Cluster of nodules within the lateral aspect of the right middle lobe noted measuring up to 4 mm (axial image 3/4). Clustered appearance suggests infectious/inflammatory process. Number new compared with the previous exam. LIVER: No focal liver abnormality. GALLBLADDER AND BILE DUCTS: The gallbladder wall is diffusely thickened measuring up to 1.2 cm. There is hyperdense material identified within the lumen of the gallbladder which may reflect underlying gallbladder sludge secondary to biliary stasis. No bile duct dilatation identified. SPLEEN: No acute abnormality. PANCREAS: Diffuse peripancreatic soft tissue stranding is identified. No signs of main duct dilatation or mass. Peripancreatic soft tissue stranding extends into the retroperitoneal fat and mesenteric fat. No discrete loculated fluid collections identified to suggest a mature pseudocyst. ADRENAL GLANDS: No acute abnormality. KIDNEYS, URETERS AND BLADDER: Bilateral perinephric fat stranding with no signs of obstructive uropathy. No kidney stones or kidney mass noted. Bladder appears normal. GI AND BOWEL: Enteric tube is in place with tip in the distal body of stomach. Dilatation of the large and small bowel loops. The appendix is visualized and appears normal. Scattered colonic diverticula noted without signs of acute diverticulitis. PERITONEUM AND RETROPERITONEUM: Small volume of free fluid within the dependent  portion of the pelvis which measures 11.6 Hounsfield  units (axial image 73/2). VASCULATURE: Severe aortic atherosclerotic calcification. No aneurysm. Right common femoral central venous catheter is noted with tip terminating at the junction between the right external iliac vein and right common iliac vein. LYMPH NODES: No lymphadenopathy. REPRODUCTIVE ORGANS: No acute abnormality. BONES AND SOFT TISSUES: No acute or suspicious osseous findings. First-degree anterolisthesis of L4 on L5 measures 4 mm. IMPRESSION: 1. Diffuse peripancreatic soft tissue stranding extending into the retroperitoneal and mesenteric fat, without discrete loculated fluid collections to suggest a mature pseudocyst. Findings more compatible with acute pancreatitis. 2. Gallbladder wall thickening (up to 1.2 cm) with hyperdense material within the lumen, possibly representing sludge secondary to biliary stasis. No bile duct dilatation. In the setting of acute pancreatitis the gallbladder wall thickening may be nonspecific. Underlying cholecystitis would be difficult to exclude with a high degree of certainty 3. Cluster of nodules within the lateral aspect of the right middle lobe, measuring up to 4 mm, with a clustered appearance suggestive of an infectious process. These are new compared with the previous exam. In a patient that is at low risk no further follow up is recommended. If the patient is at increased risk and follow up imaging at 12 months may be considered. 4. Small bilateral pleural effusions with overlying compressive atelectasis. 5. Aortic atherosclerotic calcification. Electronically signed by: Waddell Calk MD 03/06/2024 05:32 AM EDT RP Workstation: [id number]   DG Abdomen 1 View Result Date: Mar 06, 2024 CLINICAL DATA:  Orogastric tube placement EXAM: ABDOMEN - 1 VIEW COMPARISON:  None Available. FINDINGS: Orogastric tube tip overlies the expected distal body of the stomach. Normal abdominal gas pattern. Pelvis excluded from  view. No gross free intraperitoneal gas. IMPRESSION: 1. Orogastric tube tip within the distal body of the stomach. Electronically Signed   By: Dorethia Molt M.D.   On: 06-Mar-2024 03:35   DG Chest Portable 1 View Result Date: 06-Mar-2024 CLINICAL DATA:  Respiratory failure EXAM: PORTABLE CHEST 1 VIEW COMPARISON:  12/31/2023 FINDINGS: Endotracheal tube seen 4.8 cm above the carina. Nasogastric tube extends into the mid abdomen, beyond the margin of the examination. Pulmonary insufflation is slightly diminished, but is symmetric and the lungs are clear. No pneumothorax or pleural effusion. Extensive calcification of the mitral valve annulus. Cardiac size is within normal limits vascularity is normal. No acute bone abnormality IMPRESSION: 1. Support tubes in appropriate position. Electronically Signed   By: Dorethia Molt M.D.   On: 2024-03-06 03:34   CT Head Wo Contrast Result Date: 03-06-2024 CLINICAL DATA:  Mental status change, unknown cause.  Syncope, fall. EXAM: CT HEAD WITHOUT CONTRAST TECHNIQUE: Contiguous axial images were obtained from the base of the skull through the vertex without intravenous contrast. RADIATION DOSE REDUCTION: This exam was performed according to the departmental dose-optimization program which includes automated exposure control, adjustment of the mA and/or kV according to patient size and/or use of iterative reconstruction technique. COMPARISON:  02/13/2024 FINDINGS: Brain: Age related atrophy. No acute intracranial abnormality. Specifically, no hemorrhage, hydrocephalus, mass lesion, acute infarction, or significant intracranial injury. Vascular: No hyperdense vessel or unexpected calcification. Skull: No acute calvarial abnormality. Sinuses/Orbits: No acute findings Other: None IMPRESSION: No acute intracranial abnormality. Electronically Signed   By: Franky Crease M.D.   On: 2024/03/06 01:05   CT CERVICAL SPINE WO CONTRAST Result Date: 03-06-2024 CLINICAL DATA:  Syncope, fall  EXAM: CT CERVICAL SPINE WITHOUT CONTRAST TECHNIQUE: Multidetector CT imaging of the cervical spine was performed without intravenous contrast. Multiplanar CT image reconstructions were also generated. RADIATION DOSE REDUCTION:  This exam was performed according to the departmental dose-optimization program which includes automated exposure control, adjustment of the mA and/or kV according to patient size and/or use of iterative reconstruction technique. COMPARISON:  None Available. FINDINGS: Alignment: No subluxation Skull base and vertebrae: No acute fracture. No primary bone lesion or focal pathologic process. Soft tissues and spinal canal: No prevertebral fluid or swelling. No visible canal hematoma. Disc levels: Disc spaces maintained. Early anterior spurring. Moderate bilateral degenerative facet disease. Upper chest: No acute findings Other: None IMPRESSION: No acute bony abnormality. Electronically Signed   By: Franky Crease M.D.   On: 2024-03-10 01:04   US  ABDOMEN LIMITED RUQ (LIVER/GB) Result Date: 02/13/2024 CLINICAL DATA:  Transaminitis EXAM: ULTRASOUND ABDOMEN LIMITED RIGHT UPPER QUADRANT COMPARISON:  CT scan 04/22/2023 FINDINGS: Gallbladder: No gallstones. 9 mm gallbladder polyp with thick stalk. No sonographic Murphy sign noted by sonographer. Common bile duct: Diameter: 0.2 cm Liver: No focal lesion identified. Within normal limits in parenchymal echogenicity. Portal vein is patent on color Doppler imaging with normal direction of blood flow towards the liver. Other: None. IMPRESSION: 1. No gallstones or biliary ductal dilatation. 2. 9 mm gallbladder polyp. The thick stalk makes this a low risk polyp. Low risk polyps of this size warrant follow up ultrasound at 12 months based on the Society of radiologists and ultrasound 2021 guidelines. Accordingly, follow up hepatobiliary ultrasound is recommended in 1 years time. Electronically Signed   By: Ryan Salvage M.D.   On: 02/13/2024 16:30   CT  Head Wo Contrast Result Date: 02/13/2024 CLINICAL DATA:  Provided history: Fall on Eliquis . EXAM: CT HEAD WITHOUT CONTRAST TECHNIQUE: Contiguous axial images were obtained from the base of the skull through the vertex without intravenous contrast. RADIATION DOSE REDUCTION: This exam was performed according to the departmental dose-optimization program which includes automated exposure control, adjustment of the mA and/or kV according to patient size and/or use of iterative reconstruction technique. COMPARISON:  None. FINDINGS: Brain: Generalized cerebral atrophy. There is no acute intracranial hemorrhage. No demarcated cortical infarct. No extra-axial fluid collection. No evidence of an intracranial mass. No midline shift. Vascular: No hyperdense vessel.  Atherosclerotic calcifications. Skull: No calvarial fracture or aggressive osseous lesion. Sinuses/Orbits: No mass or acute finding within the imaged orbits. No significant paranasal sinus disease at the imaged levels. IMPRESSION: 1.  No evidence of an acute intracranial abnormality. 2. Generalized cerebral atrophy. Electronically Signed   By: Rockey Childs D.O.   On: 02/13/2024 13:02    Microbiology Recent Results (from the past 240 hours)  MRSA Next Gen by PCR, Nasal     Status: None   Collection Time: Mar 10, 2024  6:17 AM   Specimen: Nasal Mucosa; Nasal Swab  Result Value Ref Range Status   MRSA by PCR Next Gen NOT DETECTED NOT DETECTED Final    Comment: (NOTE) The GeneXpert MRSA Assay (FDA approved for NASAL specimens only), is one component of a comprehensive MRSA colonization surveillance program. It is not intended to diagnose MRSA infection nor to guide or monitor treatment for MRSA infections. Test performance is not FDA approved in patients less than 71 years old. Performed at [facility], [address] [place], [place] [address]     Lab Basic Metabolic Panel: Recent Labs  Lab 02/13/24 1158 03/10/24 0238 03/10/2024 0625   NA 139 137  --   K 4.6 4.7  --   CL 105 109  --   CO2 18* 9*  --   GLUCOSE 159* 124*  --  BUN 47* 59*  --   CREATININE 1.75* 2.34*  --   CALCIUM  8.5* 7.6*  --   MG  --   --  2.4  PHOS  --   --  7.9*   Liver Function Tests: Recent Labs  Lab 02/13/24 1158 2024/02/28 0238  AST 933* 1,969*  ALT 885* 2,027*  ALKPHOS 105 86  BILITOT 0.8 1.9*  PROT 6.0* 5.8*  ALBUMIN 3.4* 3.2*   No results for input(s): LIPASE, AMYLASE in the last 168 hours. Recent Labs  Lab 02/13/24 1952  AMMONIA 14   CBC: Recent Labs  Lab 02/13/24 1158 02/13/24 1952 2024/02/28 0053 2024-02-28 0617  WBC 9.1 10.3 16.9* 21.1*  NEUTROABS 7.9*  --   --   --   HGB 8.0* 7.3* 9.7* 8.9*  HCT 26.6* 23.4* 30.7* 29.7*  MCV 92.0 91.4 90.8 94.3  PLT 256 238 235 132*   Cardiac Enzymes: No results for input(s): CKTOTAL, CKMB, CKMBINDEX, TROPONINI in the last 168 hours. Sepsis Labs: Recent Labs  Lab 02/13/24 1158 02/13/24 1952 02/28/2024 0053 February 28, 2024 0238 28-Feb-2024 0617  WBC 9.1 10.3 16.9*  --  21.1*  LATICACIDVEN  --   --   --  >9.0* >9.0*    Procedures/Operations  ET Intubation 02/28/2024   CVC Placement 02-28-24   Jean-Pierre Eudelia Hiltunen 02/15/2024, 5:05 PM

## 2024-02-17 DEATH — deceased
